# Patient Record
Sex: Female | Born: 1960
Health system: Southern US, Community
[De-identification: ages and names within clinical notes are randomized; demographics above are authoritative.]

## PROBLEM LIST (undated history)

## (undated) DIAGNOSIS — C50919 Malignant neoplasm of unspecified site of unspecified female breast: Secondary | ICD-10-CM

## (undated) DIAGNOSIS — Z9221 Personal history of antineoplastic chemotherapy: Secondary | ICD-10-CM

## (undated) DIAGNOSIS — I10 Essential (primary) hypertension: Secondary | ICD-10-CM

## (undated) DIAGNOSIS — A539 Syphilis, unspecified: Secondary | ICD-10-CM

## (undated) DIAGNOSIS — E079 Disorder of thyroid, unspecified: Secondary | ICD-10-CM

## (undated) DIAGNOSIS — F319 Bipolar disorder, unspecified: Secondary | ICD-10-CM

## (undated) HISTORY — DX: Syphilis, unspecified: A53.9

## (undated) HISTORY — PX: TUBAL LIGATION: SHX77

## (undated) HISTORY — PX: MASTECTOMY: SHX3

---

## 1995-04-20 HISTORY — PX: BREAST BIOPSY: SHX20

## 1998-07-07 ENCOUNTER — Ambulatory Visit (HOSPITAL_COMMUNITY): Admission: RE | Admit: 1998-07-07 | Discharge: 1998-07-07 | Payer: Self-pay | Admitting: Hematology and Oncology

## 1998-07-07 ENCOUNTER — Encounter: Payer: Self-pay | Admitting: Hematology and Oncology

## 1998-09-05 ENCOUNTER — Emergency Department (HOSPITAL_COMMUNITY): Admission: EM | Admit: 1998-09-05 | Discharge: 1998-09-05 | Payer: Self-pay | Admitting: Emergency Medicine

## 1998-09-05 ENCOUNTER — Encounter: Payer: Self-pay | Admitting: Emergency Medicine

## 1999-06-30 ENCOUNTER — Emergency Department (HOSPITAL_COMMUNITY): Admission: EM | Admit: 1999-06-30 | Discharge: 1999-06-30 | Payer: Self-pay | Admitting: Emergency Medicine

## 1999-07-09 ENCOUNTER — Other Ambulatory Visit: Admission: RE | Admit: 1999-07-09 | Discharge: 1999-07-09 | Payer: Self-pay | Admitting: Obstetrics

## 1999-07-27 ENCOUNTER — Encounter: Admission: RE | Admit: 1999-07-27 | Discharge: 1999-07-27 | Payer: Self-pay | Admitting: *Deleted

## 1999-07-27 ENCOUNTER — Encounter: Payer: Self-pay | Admitting: *Deleted

## 1999-08-18 ENCOUNTER — Ambulatory Visit (HOSPITAL_COMMUNITY): Admission: RE | Admit: 1999-08-18 | Discharge: 1999-08-18 | Payer: Self-pay | Admitting: *Deleted

## 1999-08-23 ENCOUNTER — Encounter: Payer: Self-pay | Admitting: *Deleted

## 1999-10-16 ENCOUNTER — Ambulatory Visit (HOSPITAL_COMMUNITY): Admission: RE | Admit: 1999-10-16 | Discharge: 1999-10-16 | Payer: Self-pay | Admitting: *Deleted

## 2000-02-12 ENCOUNTER — Other Ambulatory Visit: Admission: RE | Admit: 2000-02-12 | Discharge: 2000-02-12 | Payer: Self-pay | Admitting: Obstetrics

## 2000-02-12 ENCOUNTER — Encounter (INDEPENDENT_AMBULATORY_CARE_PROVIDER_SITE_OTHER): Payer: Self-pay | Admitting: Specialist

## 2000-08-01 ENCOUNTER — Encounter: Admission: RE | Admit: 2000-08-01 | Discharge: 2000-08-01 | Payer: Self-pay | Admitting: *Deleted

## 2000-08-01 ENCOUNTER — Encounter: Payer: Self-pay | Admitting: *Deleted

## 2000-11-07 ENCOUNTER — Other Ambulatory Visit: Admission: RE | Admit: 2000-11-07 | Discharge: 2000-11-07 | Payer: Self-pay | Admitting: *Deleted

## 2001-05-15 ENCOUNTER — Emergency Department (HOSPITAL_COMMUNITY): Admission: EM | Admit: 2001-05-15 | Discharge: 2001-05-15 | Payer: Self-pay | Admitting: Emergency Medicine

## 2001-07-31 ENCOUNTER — Encounter (HOSPITAL_COMMUNITY): Payer: Self-pay | Admitting: Oncology

## 2001-07-31 ENCOUNTER — Ambulatory Visit (HOSPITAL_COMMUNITY): Admission: RE | Admit: 2001-07-31 | Discharge: 2001-07-31 | Payer: Self-pay | Admitting: Oncology

## 2001-08-01 ENCOUNTER — Encounter: Admission: RE | Admit: 2001-08-01 | Discharge: 2001-08-01 | Payer: Self-pay | Admitting: Internal Medicine

## 2001-08-01 ENCOUNTER — Encounter: Payer: Self-pay | Admitting: Internal Medicine

## 2001-09-16 ENCOUNTER — Emergency Department (HOSPITAL_COMMUNITY): Admission: EM | Admit: 2001-09-16 | Discharge: 2001-09-16 | Payer: Self-pay | Admitting: Emergency Medicine

## 2001-09-16 ENCOUNTER — Encounter: Payer: Self-pay | Admitting: Emergency Medicine

## 2001-12-27 ENCOUNTER — Other Ambulatory Visit: Admission: RE | Admit: 2001-12-27 | Discharge: 2001-12-27 | Payer: Self-pay | Admitting: Obstetrics and Gynecology

## 2003-02-15 ENCOUNTER — Encounter: Admission: RE | Admit: 2003-02-15 | Discharge: 2003-02-15 | Payer: Self-pay | Admitting: Oncology

## 2004-03-16 ENCOUNTER — Other Ambulatory Visit: Admission: RE | Admit: 2004-03-16 | Discharge: 2004-03-16 | Payer: Self-pay | Admitting: Obstetrics and Gynecology

## 2004-03-30 ENCOUNTER — Ambulatory Visit (HOSPITAL_COMMUNITY): Admission: RE | Admit: 2004-03-30 | Discharge: 2004-03-30 | Payer: Self-pay | Admitting: Oncology

## 2004-03-31 ENCOUNTER — Ambulatory Visit: Payer: Self-pay | Admitting: Oncology

## 2004-03-31 ENCOUNTER — Ambulatory Visit (HOSPITAL_COMMUNITY): Admission: RE | Admit: 2004-03-31 | Discharge: 2004-03-31 | Payer: Self-pay | Admitting: Oncology

## 2004-04-23 ENCOUNTER — Emergency Department (HOSPITAL_COMMUNITY): Admission: EM | Admit: 2004-04-23 | Discharge: 2004-04-23 | Payer: Self-pay | Admitting: Emergency Medicine

## 2004-07-17 ENCOUNTER — Emergency Department (HOSPITAL_COMMUNITY): Admission: EM | Admit: 2004-07-17 | Discharge: 2004-07-17 | Payer: Self-pay | Admitting: Family Medicine

## 2004-09-16 ENCOUNTER — Emergency Department (HOSPITAL_COMMUNITY): Admission: EM | Admit: 2004-09-16 | Discharge: 2004-09-16 | Payer: Self-pay | Admitting: Emergency Medicine

## 2004-09-28 ENCOUNTER — Ambulatory Visit: Payer: Self-pay | Admitting: Oncology

## 2005-02-14 ENCOUNTER — Emergency Department (HOSPITAL_COMMUNITY): Admission: EM | Admit: 2005-02-14 | Discharge: 2005-02-14 | Payer: Self-pay | Admitting: Emergency Medicine

## 2005-06-01 ENCOUNTER — Other Ambulatory Visit: Admission: RE | Admit: 2005-06-01 | Discharge: 2005-06-01 | Payer: Self-pay | Admitting: Obstetrics and Gynecology

## 2005-06-04 ENCOUNTER — Encounter: Admission: RE | Admit: 2005-06-04 | Discharge: 2005-06-04 | Payer: Self-pay | Admitting: Oncology

## 2005-06-04 ENCOUNTER — Ambulatory Visit: Payer: Self-pay | Admitting: Oncology

## 2005-06-07 ENCOUNTER — Ambulatory Visit: Payer: Self-pay | Admitting: Oncology

## 2005-06-14 ENCOUNTER — Ambulatory Visit (HOSPITAL_COMMUNITY): Admission: RE | Admit: 2005-06-14 | Discharge: 2005-06-14 | Payer: Self-pay | Admitting: Oncology

## 2005-06-22 ENCOUNTER — Encounter: Admission: RE | Admit: 2005-06-22 | Discharge: 2005-06-22 | Payer: Self-pay | Admitting: Oncology

## 2006-01-12 ENCOUNTER — Encounter: Payer: Self-pay | Admitting: Family

## 2006-06-01 ENCOUNTER — Ambulatory Visit: Payer: Self-pay | Admitting: Oncology

## 2006-08-18 ENCOUNTER — Encounter (INDEPENDENT_AMBULATORY_CARE_PROVIDER_SITE_OTHER): Payer: Self-pay | Admitting: Nurse Practitioner

## 2006-08-18 LAB — CONVERTED CEMR LAB: Pap Smear: NORMAL

## 2006-09-29 ENCOUNTER — Encounter: Admission: RE | Admit: 2006-09-29 | Discharge: 2006-09-29 | Payer: Self-pay | Admitting: Family Medicine

## 2006-12-02 ENCOUNTER — Ambulatory Visit: Payer: Self-pay | Admitting: Family Medicine

## 2006-12-02 DIAGNOSIS — K0510 Chronic gingivitis, plaque induced: Secondary | ICD-10-CM | POA: Insufficient documentation

## 2006-12-02 DIAGNOSIS — K029 Dental caries, unspecified: Secondary | ICD-10-CM | POA: Insufficient documentation

## 2006-12-07 ENCOUNTER — Encounter: Payer: Self-pay | Admitting: Nurse Practitioner

## 2006-12-07 ENCOUNTER — Encounter (INDEPENDENT_AMBULATORY_CARE_PROVIDER_SITE_OTHER): Payer: Self-pay | Admitting: Nurse Practitioner

## 2006-12-07 DIAGNOSIS — F200 Paranoid schizophrenia: Secondary | ICD-10-CM | POA: Insufficient documentation

## 2006-12-07 DIAGNOSIS — Z853 Personal history of malignant neoplasm of breast: Secondary | ICD-10-CM | POA: Insufficient documentation

## 2006-12-07 DIAGNOSIS — I10 Essential (primary) hypertension: Secondary | ICD-10-CM | POA: Insufficient documentation

## 2006-12-07 DIAGNOSIS — A539 Syphilis, unspecified: Secondary | ICD-10-CM | POA: Insufficient documentation

## 2006-12-07 DIAGNOSIS — E0500 Thyrotoxicosis with diffuse goiter without thyrotoxic crisis or storm: Secondary | ICD-10-CM | POA: Insufficient documentation

## 2006-12-09 ENCOUNTER — Ambulatory Visit: Payer: Self-pay | Admitting: *Deleted

## 2007-02-27 ENCOUNTER — Emergency Department (HOSPITAL_COMMUNITY): Admission: EM | Admit: 2007-02-27 | Discharge: 2007-02-27 | Payer: Self-pay | Admitting: Emergency Medicine

## 2007-04-28 ENCOUNTER — Encounter: Admission: RE | Admit: 2007-04-28 | Discharge: 2007-04-28 | Payer: Self-pay | Admitting: Internal Medicine

## 2007-10-13 ENCOUNTER — Other Ambulatory Visit: Admission: RE | Admit: 2007-10-13 | Discharge: 2007-10-13 | Payer: Self-pay | Admitting: Gynecology

## 2007-10-16 ENCOUNTER — Ambulatory Visit (HOSPITAL_COMMUNITY): Admission: RE | Admit: 2007-10-16 | Discharge: 2007-10-16 | Payer: Self-pay | Admitting: Gynecology

## 2007-10-23 ENCOUNTER — Encounter: Admission: RE | Admit: 2007-10-23 | Discharge: 2007-10-23 | Payer: Self-pay | Admitting: Gynecology

## 2007-11-09 ENCOUNTER — Encounter: Payer: Self-pay | Admitting: Family

## 2007-11-23 ENCOUNTER — Encounter: Payer: Self-pay | Admitting: Family

## 2008-02-19 ENCOUNTER — Emergency Department (HOSPITAL_COMMUNITY): Admission: EM | Admit: 2008-02-19 | Discharge: 2008-02-19 | Payer: Self-pay | Admitting: Emergency Medicine

## 2008-03-17 ENCOUNTER — Inpatient Hospital Stay (HOSPITAL_COMMUNITY): Admission: EM | Admit: 2008-03-17 | Discharge: 2008-03-18 | Payer: Self-pay | Admitting: Emergency Medicine

## 2008-05-17 ENCOUNTER — Encounter: Admission: RE | Admit: 2008-05-17 | Discharge: 2008-05-17 | Payer: Self-pay | Admitting: Gynecology

## 2008-07-05 ENCOUNTER — Ambulatory Visit: Payer: Self-pay | Admitting: Gynecology

## 2009-02-27 ENCOUNTER — Encounter: Admission: RE | Admit: 2009-02-27 | Discharge: 2009-02-27 | Payer: Self-pay | Admitting: Infectious Diseases

## 2009-03-28 ENCOUNTER — Emergency Department (HOSPITAL_COMMUNITY): Admission: EM | Admit: 2009-03-28 | Discharge: 2009-03-28 | Payer: Self-pay | Admitting: Family Medicine

## 2009-04-27 ENCOUNTER — Emergency Department (HOSPITAL_COMMUNITY): Admission: EM | Admit: 2009-04-27 | Discharge: 2009-04-27 | Payer: Self-pay | Admitting: Family Medicine

## 2009-05-27 ENCOUNTER — Emergency Department (HOSPITAL_COMMUNITY)
Admission: EM | Admit: 2009-05-27 | Discharge: 2009-05-27 | Payer: Self-pay | Source: Home / Self Care | Admitting: Family Medicine

## 2009-06-24 ENCOUNTER — Ambulatory Visit: Payer: Self-pay | Admitting: Family

## 2009-06-24 ENCOUNTER — Encounter (INDEPENDENT_AMBULATORY_CARE_PROVIDER_SITE_OTHER): Payer: Self-pay | Admitting: *Deleted

## 2009-06-24 DIAGNOSIS — E039 Hypothyroidism, unspecified: Secondary | ICD-10-CM | POA: Insufficient documentation

## 2009-06-24 DIAGNOSIS — I10 Essential (primary) hypertension: Secondary | ICD-10-CM | POA: Insufficient documentation

## 2009-06-24 DIAGNOSIS — R9431 Abnormal electrocardiogram [ECG] [EKG]: Secondary | ICD-10-CM | POA: Insufficient documentation

## 2009-06-24 DIAGNOSIS — F17200 Nicotine dependence, unspecified, uncomplicated: Secondary | ICD-10-CM | POA: Insufficient documentation

## 2009-06-24 DIAGNOSIS — F200 Paranoid schizophrenia: Secondary | ICD-10-CM | POA: Insufficient documentation

## 2009-07-07 ENCOUNTER — Telehealth (INDEPENDENT_AMBULATORY_CARE_PROVIDER_SITE_OTHER): Payer: Self-pay | Admitting: *Deleted

## 2009-07-07 ENCOUNTER — Ambulatory Visit: Payer: Self-pay | Admitting: Gastroenterology

## 2009-07-08 ENCOUNTER — Ambulatory Visit: Payer: Self-pay | Admitting: Family

## 2009-07-08 LAB — CONVERTED CEMR LAB
AST: 19 units/L (ref 0–37)
Albumin: 3.6 g/dL (ref 3.5–5.2)
Basophils Relative: 0.1 % (ref 0.0–3.0)
Eosinophils Absolute: 0 10*3/uL (ref 0.0–0.7)
Eosinophils Relative: 0.9 % (ref 0.0–5.0)
GFR calc non Af Amer: 85.38 mL/min (ref >60)
LDL Cholesterol: 80 mg/dL (ref 0–99)
Lymphocytes Relative: 71.2 % — ABNORMAL HIGH (ref 12.0–46.0)
MCHC: 32.4 g/dL (ref 30.0–36.0)
Monocytes Absolute: 0.4 10*3/uL (ref 0.1–1.0)
Potassium: 3.6 meq/L (ref 3.5–5.1)
RBC: 4.26 M/uL (ref 3.87–5.11)
RDW: 13.4 % (ref 11.5–14.6)
Sodium: 141 meq/L (ref 135–145)
Total Bilirubin: 0.4 mg/dL (ref 0.3–1.2)
Total Protein: 7 g/dL (ref 6.0–8.3)
WBC: 3.8 10*3/uL — ABNORMAL LOW (ref 4.5–10.5)

## 2009-07-09 ENCOUNTER — Encounter: Payer: Self-pay | Admitting: Family

## 2009-07-11 ENCOUNTER — Encounter: Payer: Self-pay | Admitting: Family

## 2009-07-11 DIAGNOSIS — C50919 Malignant neoplasm of unspecified site of unspecified female breast: Secondary | ICD-10-CM | POA: Insufficient documentation

## 2009-07-11 DIAGNOSIS — E039 Hypothyroidism, unspecified: Secondary | ICD-10-CM | POA: Insufficient documentation

## 2009-07-14 ENCOUNTER — Telehealth: Payer: Self-pay | Admitting: Family

## 2009-08-26 ENCOUNTER — Encounter (INDEPENDENT_AMBULATORY_CARE_PROVIDER_SITE_OTHER): Payer: Self-pay

## 2009-08-26 ENCOUNTER — Ambulatory Visit: Payer: Self-pay | Admitting: Gastroenterology

## 2009-09-08 ENCOUNTER — Telehealth: Payer: Self-pay | Admitting: Family

## 2009-09-19 ENCOUNTER — Ambulatory Visit: Payer: Self-pay | Admitting: Family

## 2009-10-16 ENCOUNTER — Ambulatory Visit: Payer: Self-pay | Admitting: Nurse Practitioner

## 2009-10-16 DIAGNOSIS — E669 Obesity, unspecified: Secondary | ICD-10-CM | POA: Insufficient documentation

## 2010-03-16 ENCOUNTER — Telehealth (INDEPENDENT_AMBULATORY_CARE_PROVIDER_SITE_OTHER): Payer: Self-pay | Admitting: Nurse Practitioner

## 2010-05-06 ENCOUNTER — Encounter (INDEPENDENT_AMBULATORY_CARE_PROVIDER_SITE_OTHER): Payer: Self-pay | Admitting: Nurse Practitioner

## 2010-05-06 ENCOUNTER — Other Ambulatory Visit: Payer: Self-pay | Admitting: Nurse Practitioner

## 2010-05-06 ENCOUNTER — Ambulatory Visit
Admission: RE | Admit: 2010-05-06 | Discharge: 2010-05-06 | Payer: Self-pay | Source: Home / Self Care | Attending: Nurse Practitioner | Admitting: Nurse Practitioner

## 2010-05-06 LAB — CONVERTED CEMR LAB
ALT: 18 units/L (ref 0–35)
AST: 16 units/L (ref 0–37)
Albumin: 4.4 g/dL (ref 3.5–5.2)
Basophils Absolute: 0 10*3/uL (ref 0.0–0.1)
Calcium: 10 mg/dL (ref 8.4–10.5)
Chloride: 103 meq/L (ref 96–112)
Glucose, Bld: 75 mg/dL (ref 70–99)
Glucose, Urine, Semiquant: NEGATIVE
HCT: 38.9 % (ref 36.0–46.0)
Hemoglobin: 12.8 g/dL (ref 12.0–15.0)
KOH Prep: NEGATIVE
Lymphs Abs: 4.2 10*3/uL — ABNORMAL HIGH (ref 0.7–4.0)
MCHC: 32.9 g/dL (ref 30.0–36.0)
MCV: 87.6 fL (ref 78.0–100.0)
Microalb, Ur: 3.59 mg/dL — ABNORMAL HIGH (ref 0.00–1.89)
Monocytes Relative: 5 % (ref 3–12)
Neutro Abs: 2.7 10*3/uL (ref 1.7–7.7)
Neutrophils Relative %: 36 % — ABNORMAL LOW (ref 43–77)
OCCULT 1: NEGATIVE
Protein, U semiquant: 30
RDW: 14.2 % (ref 11.5–15.5)
Rapid HIV Screen: NEGATIVE
Specific Gravity, Urine: >=1.03
Urobilinogen, UA: 0.2
pH: 6

## 2010-05-07 ENCOUNTER — Ambulatory Visit (HOSPITAL_COMMUNITY)
Admission: RE | Admit: 2010-05-07 | Discharge: 2010-05-07 | Payer: Self-pay | Source: Home / Self Care | Attending: Internal Medicine | Admitting: Internal Medicine

## 2010-05-07 ENCOUNTER — Encounter (INDEPENDENT_AMBULATORY_CARE_PROVIDER_SITE_OTHER): Payer: Self-pay | Admitting: Nurse Practitioner

## 2010-05-07 ENCOUNTER — Ambulatory Visit (HOSPITAL_COMMUNITY)
Admission: RE | Admit: 2010-05-07 | Discharge: 2010-05-07 | Payer: Self-pay | Source: Home / Self Care | Admitting: Internal Medicine

## 2010-05-10 ENCOUNTER — Encounter (HOSPITAL_COMMUNITY): Payer: Self-pay | Admitting: Oncology

## 2010-05-10 ENCOUNTER — Encounter: Payer: Self-pay | Admitting: Gynecology

## 2010-05-19 NOTE — Miscellaneous (Signed)
  Clinical Lists Changes  Problems: Added new problem of MALIGNANT NEOPLASM OF BREAST UNSPECIFIED SITE (ICD-174.9) Added new problem of GRAVES' DISEASE, HX OF (ICD-V12.2)

## 2010-05-19 NOTE — Miscellaneous (Signed)
  Clinical Lists Changes 

## 2010-05-19 NOTE — Letter (Signed)
Summary: Previsit letter  [facility]  [address] [place], [place] [address]   Phone: [phone]  Fax: [phone]       06/24/2009 MRN: [id number]  Jacqueline Berry [address] [place], [place]  [address]  Dear [name],  Welcome to the Gastroenterology Division at [facility].    You are scheduled to see a nurse for your pre-procedure visit on 07-21-09 at 11:30am on the 3rd floor at [facility], [address]  We ask that you try to arrive at our office 15 minutes prior to your appointment time to allow for check-in.  Your nurse visit will consist of discussing your medical and surgical history, your immediate family medical history, and your medications.    Please bring a complete list of all your medications or, if you prefer, bring the medication bottles and we will list them.  We will need to be aware of both prescribed and over the counter drugs.  We will need to know exact dosage information as well.  If you are on blood thinners (Coumadin, Plavix, Aggrenox, Ticlid, etc.) please call our office today/prior to your appointment, as we need to consult with your physician about holding your medication.   Please be prepared to read and sign documents such as consent forms, a financial agreement, and acknowledgement forms.  If necessary, and with your consent, a friend or relative is welcome to sit-in on the nurse visit with you.  Please bring your insurance card so that we may make a copy of it.  If your insurance requires a referral to see a specialist, please bring your referral form from your primary care physician.  No co-pay is required for this nurse visit.     If you cannot keep your appointment, please call [phone] to cancel or reschedule prior to your appointment date.  This allows [place] the opportunity to schedule an appointment for another patient in need of care.    Thank you for choosing [facility] for your medical needs.   We appreciate the opportunity to care for you.  Please visit [place] at our website  to learn more about our practice.                     Sincerely.                                                                                                                   The Gastroenterology Division

## 2010-05-19 NOTE — Letter (Signed)
Summary: [facility] Instructions   Gastroenterology  [address] [place], [place] [address]   Phone: [phone]  Fax: [phone]       Jacqueline Berry    [birth year]    MRN: [id number]        Procedure Day Dorna Bloom:  Jake Shark  09/09/09     Arrival Time:  8:30am     Procedure Time:  9:30am    Location of Procedure:                    [facility] (4th Floor)                        PREPARATION FOR COLONOSCOPY WITH MOVIPREP   Starting 5 days prior to your procedure  THURSDAY 05/19  do not eat nuts, seeds, popcorn, corn, beans, peas,  salads, or any raw vegetables.  Do not take any fiber supplements (e.g. Metamucil, Citrucel, and Benefiber).  THE DAY BEFORE YOUR PROCEDURE         DATE:  05/23 DAY: MONDAY  1.  Drink clear liquids the entire day-NO SOLID FOOD  2.  Do not drink anything colored red or purple.  Avoid juices with pulp.  No orange juice.  3.  Drink at least 64 oz. (8 glasses) of fluid/clear liquids during the day to prevent dehydration and help the prep work efficiently.  CLEAR LIQUIDS INCLUDE: Water Jello Ice Popsicles Tea (sugar ok, no milk/cream) Powdered fruit flavored drinks Coffee (sugar ok, no milk/cream) Gatorade Juice: apple, white grape, white cranberry  Lemonade Clear bullion, consomm, broth Carbonated beverages (any kind) Strained chicken noodle soup Hard Candy                             4.  In the morning, mix first dose of MoviPrep solution:    Empty 1 Pouch A and 1 Pouch B into the disposable container    Add lukewarm drinking water to the top line of the container. Mix to dissolve    Refrigerate (mixed solution should be used within 24 hrs)  5.  Begin drinking the prep at 5:00 p.m. The MoviPrep container is divided by 4 marks.   Every 15 minutes drink the solution down to the next mark (approximately 8 oz) until the full liter is complete.   6.  Follow completed prep with 16 oz of clear liquid of your choice  (Nothing red or purple).  Continue to drink clear liquids until bedtime.  7.  Before going to bed, mix second dose of MoviPrep solution:    Empty 1 Pouch A and 1 Pouch B into the disposable container    Add lukewarm drinking water to the top line of the container. Mix to dissolve    Refrigerate  THE DAY OF YOUR PROCEDURE      DATE: 05/24  DAY: TUESDAY  Beginning at  4:30 a.m. (5 hours before procedure):         1. Every 15 minutes, drink the solution down to the next mark (approx 8 oz) until the full liter is complete.  2. Follow completed prep with 16 oz. of clear liquid of your choice.    3. You may drink clear liquids until 7:30am  (2 HOURS BEFORE PROCEDURE).   MEDICATION INSTRUCTIONS  Unless otherwise instructed, you should take regular prescription medications with a small sip of water   as early  as possible the morning of your procedure..  Additional medication instructions: Do not take Lisinopril am of procedure.         OTHER INSTRUCTIONS  You will need a responsible adult at least 51 years of age to accompany you and drive you home.   This person must remain in the waiting room during your procedure.  Wear loose fitting clothing that is easily removed.  Leave jewelry and other valuables at home.  However, you may wish to bring a book to read or  an iPod/MP3 player to listen to music as you wait for your procedure to start.  Remove all body piercing jewelry and leave at home.  Total time from sign-in until discharge is approximately 2-3 hours.  You should go home directly after your procedure and rest.  You can resume normal activities the  day after your procedure.  The day of your procedure you should not:   Drive   Make legal decisions   Operate machinery   Drink alcohol   Return to work  You will receive specific instructions about eating, activities and medications before you leave.    The above instructions have been reviewed and  explained to me by   Ulis Rias RN  Aug 26, 2009 2:51 PM     I fully understand and can verbalize these instructions _____________________________ Date _________

## 2010-05-19 NOTE — Progress Notes (Signed)
Summary: 90 day supply levothroid, lisinopril-hctz?  Phone Note Outgoing Call   Summary of Call: Pt is due to come back to be seen in early June.  She can come sooner if she is concerned that her insurance is going to run out and I will be happy to refill for 90 days.   Initial call taken by: Lemont Fillers FNP,  Sep 08, 2009 1:26 PM  Follow-up for Phone Call        Left message on machine to return my call.  Mervin Kung CMA  Sep 08, 2009 1:58 PM   Additional Follow-up for Phone Call Additional follow up Details #1::        Advised pt per [facility] instructions.  Pt states she is already out of ins. and new ins. will not be effective until 90 days.  She has cancelled her f/u in June and says she doesn't know if she will have the money to be seen in one month.  Mervin Kung CMA  Sep 09, 2009 8:19 AM     Additional Follow-up for Phone Call Additional follow up Details #2::    Spoke to patient.  She is agreeable to lab draw and nurse visit for BP check.  Please arrange and set up BMET (401.1), TSH (244.9).  We will plan for a full visit in 3 months when her insurance is available. Thanks Follow-up by: Lemont Fillers FNP,  Sep 09, 2009 8:42 AM  Additional Follow-up for Phone Call Additional follow up Details #3:: Details for Additional Follow-up Action Taken: Advised pt. per [facility] instructions.  Nurse visit for bp check scheduled for 10/02/09 @ 1:30pm then pt. will go to lab for blood work.  Order printed and faxed to lab.  Mervin Kung CMA  Sep 09, 2009 9:59 AM

## 2010-05-19 NOTE — Assessment & Plan Note (Signed)
Summary: new to [facility]//[facility]   Vital Signs:  Patient profile:   50 year old female Height:      63.25 inches Weight:      211.38 pounds BMI:     37.28 Temp:     97.0 degrees F oral Pulse rate:   80 / minute Pulse rhythm:   regular Resp:     16 per minute BP sitting:   110 / 80  (right arm) Cuff size:   large  Vitals Entered By: Mervin Kung CMA (June 24, 2009 9:23 AM) Comments Pt would like Zyban to help stop smoking Pt needs refills on LIsinopril-HCTZ and Levothyroxin.   History of Present Illness: Jacqueline Berry is a 50 year old female who presents today to establish care.  Previously she was seeing Dr. Leslee Home.  She is switching providers due to insurance.  Tobacco abuse- smokes 1/2 PPD- wants to quit, has not tried any quitting aids in the past.  Has quit off/on due to pregnancy and during chemotherapy.    Breast CA- 1997, had L mastectomy, chemo. She [facility] by Dr. Arline Asp, but has not been back in years- due to being uninsured.  Depression- takes Haldol, seen at [facility].  Feels like her depression is well controlled on monthy haldol injections.  Denies suicide ideation.  HTN- Is not compliant with low sodium diet.    Preventative- last Mammo 03/2009, last pap- 2009-she sees Dr. Lily Peer of GYN and plans to call to schedule PAP.  Notes + hx of fibroids- she is considering hysterectomy- but put off due to lack of insurance. Thinks last tetanus was 2008 with Dr. Lorenz Coaster, declines flu shot.  Does not exercise.  Notes 15 lb weight gain since 8/10- since starting a sedentary job.    Preventive Screening-Counseling & Management  Alcohol-Tobacco     Smoking Status: current     Smoking Cessation Counseling: yes     Packs/Day: 0.5     Year Started: 1980  Caffeine-Diet-Exercise     Does Patient Exercise: no  Allergies (verified): No Known Drug Allergies  Past History:  Past Medical History: Breast Cancer left  breast--1997(mastectomy) Depression HTN  Past Surgical History: Mastectomy left breast--1997 Bilateral Tubal Ligation 1993  Family History: Colon cancer--mother, deceased at age 77 (pt not sure if breast or colon cancer)  Breast Cancer--mother,sisiter Lung Cancer--maternal aunt Father--stroke deceased at age 18 HTN--father Diabetes--brother Daugher- anxiety  Social History: Married Current Smoker Alcohol use-no Regular exercise-no 3 children Smoking Status:  current Packs/Day:  0.5 Does Patient Exercise:  no  Review of Systems       Constitutional: Denies Fever ENT:  Denies nasal congestion or sore throat. Resp: Denies cough CV:  Denies Chest Pain GI:  Denies nausea or vomitting GU: Denies dysuria Lymphatic: Denies lymphadenopathy Musculoskeletal:  Denies muscle/joint pain Skin:  Denies Rashes Psychiatric: Denies suicide ideation,  depression is well controlled, denies anxiety Neuro: Denies numbness     Physical Exam  General:  Well-developed,well-nourished,in no acute distress; alert,appropriate and cooperative throughout examination Head:  Normocephalic and atraumatic without obvious abnormalities. No apparent alopecia or balding. Eyes:  PERRLA Ears:  External ear exam shows no significant lesions or deformities.  Otoscopic examination reveals clear canals, tympanic membranes are intact bilaterally without bulging, retraction, inflammation or discharge. Hearing is grossly normal bilaterally. Mouth:  pharynx pink and moist and poor dentition.   Neck:  No deformities, masses, or tenderness noted. Breasts:  R breast  normal, no masses, left breast- surgically absent Lungs:  Normal respiratory effort, chest expands symmetrically. Lungs are clear to auscultation, no crackles or wheezes. Heart:  Normal rate and regular rhythm. S1 and S2 normal without gallop, murmur, click, rub or other extra sounds. Abdomen:  Bowel sounds positive,abdomen soft and non-tender without  masses, organomegaly or hernias noted. Genitalia:  deferred to GYN Msk:  No deformity or scoliosis noted of thoracic or lumbar spine.   Extremities:  No clubbing, cyanosis, edema, or deformity noted with normal full range of motion of all joints.   Neurologic:  alert & oriented X3, strength normal in all extremities, and gait normal.  2+bilateral patellar/plantar reflex Skin:  Intact without suspicious lesions or rashes Cervical Nodes:  No lymphadenopathy noted Axillary Nodes:  No palpable lymphadenopathy Psych:  Cognition and judgment appear intact. Alert and cooperative with normal attention span and concentration. No apparent delusions, illusions, hallucinations   Impression & Recommendations:  Problem # 1:  Preventive Health Care (ICD-V70.0) Assessment New Patient counselled RE:  weight loss, diet, exercise.  Will try to obtain old records RE: tetanus.  Up to date on Mammo.  Will refer for screening colonoscopy, patient tells me mom died in her 57's and she believes she may have had colon CA.  Pt to return for fasting laboratories.   Problem # 2:  PARANOID SCHIZOPHRENIA IN REMISSION (ICD-295.35) Assessment: Improved Follows with mental health. Tells me this is well controlled.  Problem # 3:  TOBACCO ABUSE (ICD-305.1)  Pt instructed to try patch.  I am hesitant to try Wellbutrin or chantix given her psychiatric history.  Orders: Tobacco use cessation intermediate 3-10 minutes ([address])  Problem # 4:  HYPERTENSION (ICD-401.9) Assessment: Improved Patient counselled on low sodium diet.  Plan to check BMET with fasting labs Her updated medication list for this problem includes:    Lisinopril-hydrochlorothiazide 20-12.5 Mg Tabs (Lisinopril-hydrochlorothiazide) .Marland Kitchen... Take 1 tablet by mouth once a day  BP today: 110/80  Problem # 5:  ABNORMAL ELECTROCARDIOGRAM (ICD-794.31) Assessment: Comment Only EG notes TWI in V1, ? old inferior infarct, ?old anteroseptal infact.  Pt had admission  2009 for atypical CP and tells me that she had EKG which showed old MI- this was followed by a reportedly normal stress test.  Records indicate that this stress test was likely done under Dr. Viann Fish.  Will request old EKG and stress test records as well as records from primary MD.  Pt is asymptomatic.  Complete Medication List: 1)  Lisinopril-hydrochlorothiazide 20-12.5 Mg Tabs (Lisinopril-hydrochlorothiazide) .... Take 1 tablet by mouth once a day 2)  Levothyroxine Sodium 100 Mcg Tabs (Levothyroxine sodium) .... Take 1 tablet by mouth once a day  Other Orders: Gastroenterology Referral (GI)  Patient Instructions: 1)  Please return fasting for the following labs: 2)  BMET, CBC, LFT's, FLP,  TSH (V70) 3)  Tobacco is very bad for your health and your loved ones! You Should stop smoking!. 4)  Stop Smoking Tips: Choose a Quit date. Cut down before the Quit date. decide what you will do as a substitute when you feel the urge to smoke(gum,toothpick,exercise). 5)  It is important that you exercise regularly at least 20 minutes 5 times a week. If you develop chest pain, have severe difficulty breathing, or feel very tired , stop exercising immediately and seek medical attention. 6)  Please follow up in 3 months.   7)  You need to lose weight. Consider a lower calorie diet and regular exercise.  Prescriptions:  LEVOTHYROXINE SODIUM 100 MCG TABS (LEVOTHYROXINE SODIUM) Take 1 tablet by mouth once a day  #30 x 2   Entered and Authorized by:   Lemont Fillers FNP   Signed by:   Lemont Fillers FNP on 06/24/2009   Method used:   Electronically to        [facility] [phone]* (retail)       [address]       [place], [place]  [address]       Ph: [phone]       Fax: [phone]   RxID:   [id number] LISINOPRIL-HYDROCHLOROTHIAZIDE 20-12.5 MG TABS (LISINOPRIL-HYDROCHLOROTHIAZIDE) Take 1 tablet by mouth once a day  #30 x 2   Entered and Authorized by:   Lemont Fillers  FNP   Signed by:   Lemont Fillers FNP on 06/24/2009   Method used:   Electronically to        [facility] [phone]* (retail)       [address]       [place], [place]  [address]       Ph: [phone]       Fax: [phone]   RxID:   [id number]    Current Allergies (reviewed today): No known allergies     Preventive Care Screening  Mammogram:    Date:  04/15/2009    Results:  normal   Pap Smear:    Date:  12/19/2007    Results:  normal        Vital Signs:  Patient Profile:   50 year old female Height:     63.25 inches Weight:      211.38 pounds BMI:     37.28 Temp:     97.0 degrees F oral Pulse rate:   80 / minute Pulse rhythm:   regular Resp:     16 per minute BP sitting:   110 / 80 Cuff size:   large

## 2010-05-19 NOTE — Letter (Signed)
Summary: Viann Fish MD  Viann Fish MD   Imported By: Lanelle Bal 07/01/2009 12:30:46  _____________________________________________________________________  External Attachment:    Type:   Image     Comment:   External Document

## 2010-05-19 NOTE — Progress Notes (Signed)
Summary: Query:  Refill lisinopril-HCTZ and levothyroxine?  Phone Note Refill Request   Refills Requested: Medication #1:  LISINOPRIL-HYDROCHLOROTHIAZIDE 20-12.5 MG TABS Take 1 tablet by mouth once a day  Medication #2:  LEVOTHROID 112 MCG TABS one tablet by mouth daily. [facility] [address]Last PT :    [phone] LIVE MESSAGE PLEASE .  Initial call taken by: Domenic Polite,  March 16, 2010 9:12 AM  Follow-up for Phone Call        Sent to N. Daphine Deutscher.  Refill meds?  Last seen 10/16/09.  Dutch Quint RN  March 16, 2010 5:01 PM   Additional Follow-up for Phone Call Additional follow up Details #1::        per last visit pt was supposed to f/u for a CPE contact pt and see if you can assist in scheduling for pt. Ok to renew BP meds with 2 refills Additional Follow-up by: Lehman Prom FNP,  March 16, 2010 6:10 PM    Additional Follow-up for Phone Call Additional follow up Details #2::    Refill completed -- phone line is busy.  Dutch Quint RN  March 17, 2010 5:48 PM   Left message on answering machine for pt to call back.Marland KitchenMarland KitchenMarland KitchenArmenia Shannon  March 18, 2010 9:31 AM    pt has cpe scheduled.. January 18.. did you want to refill the thyroid med...Marland KitchenMarland KitchenArmenia Shannon  March 18, 2010 10:01 AM   Additional Follow-up for Phone Call Additional follow up Details #3:: Details for Additional Follow-up Action Taken: yes, i also sent the thyroid medication. notify pt. both should be at pharmacy n.[facility] March 18, 2010  1:39 PM   pt aware Additional Follow-up by: [place] Shannon,  March 19, 2010 11:24 AM  Prescriptions: LEVOTHROID 112 MCG TABS (LEVOTHYROXINE SODIUM) one tablet by mouth daily  #30 x 2   Entered and Authorized by:   Lehman Prom FNP   Signed by:   Lehman Prom FNP on 03/18/2010   Method used:   Electronically to        [facility] [phone]* (retail)       [address]       [place], [place]  [address]       Ph: [phone]        Fax: [phone]   RxID:   [id number] LISINOPRIL-HYDROCHLOROTHIAZIDE 20-12.5 MG TABS (LISINOPRIL-HYDROCHLOROTHIAZIDE) Take 1 tablet by mouth once a day  #30 x 2   Entered by:   Dutch Quint RN   Authorized by:   Lehman Prom FNP   Signed by:   Dutch Quint RN on 03/17/2010   Method used:   Electronically to        [facility] [phone]* (retail)       [address]       [place], [place]  [address]       Ph: [phone]       Fax: [phone]   RxID:   [id number]

## 2010-05-19 NOTE — Assessment & Plan Note (Signed)
Summary: HTN   Vital Signs:  Patient profile:   50 year old female LMP:     08/17/2009 Height:      62.25 inches Weight:      211.2 pounds BMI:     38.46 BSA:     1.96 Temp:     97.9 degrees F oral Pulse rate:   78 / minute Pulse rhythm:   regular Resp:     20 per minute BP sitting:   133 / 92  (left arm) Cuff size:   large  Vitals Entered By: Levon Hedger (October 16, 2009 12:02 PM)  Nutrition Counseling: Patient's BMI is greater than 25 and therefore counseled on weight management options. CC: re-establish....needs medication refills, Hypertension Management Is Patient Diabetic? No Pain Assessment Patient in pain? no       Does patient need assistance? Functional Status Self care Ambulation Normal LMP (date): 08/17/2009     Enter LMP: 08/17/2009 Last PAP Result normal   CC:  re-establish....needs medication refills and Hypertension Management.  History of Present Illness:  Pt into the office for htn and thyroid Pt last seen in here in 2008. She has since been going to [place] but has changed insurance and has not been back there.  Social - married, grown children Husband is employed at this time  No acute problems today  Hypertension History:      She denies headache, chest pain, and palpitations.  She notes no problems with any antihypertensive medication side effects.  pt is taking meds as ordered.        Positive major cardiovascular risk factors include hypertension and current tobacco user.  Negative major cardiovascular risk factors include female age less than 59 years old and no history of diabetes or hyperlipidemia.        Further assessment for target organ damage reveals no history of ASHD, cardiac end-organ damage (CHF/LVH), stroke/TIA, peripheral vascular disease, renal insufficiency, or hypertensive retinopathy.     Habits & Providers  Alcohol-Tobacco-Diet     Alcohol drinks/day: 0     Tobacco Status: current     Tobacco Counseling: to  quit use of tobacco products     Cigarette Packs/Day: 0.5     Year Started: 1980  Exercise-Depression-Behavior     Does Patient Exercise: yes     Exercise Counseling: to improve exercise regimen     Type of exercise: walking     Exercise (avg: min/session): <30     Have you felt down or hopeless? yes     Have you felt little pleasure in things? yes     Depression Counseling: further diagnostic testing and/or other treatment is indicated     Drug Use: no  Comments: Pt is going to the [facility] and gets injections monthly  Allergies (verified): 1)  ! * Latex  Social History: Does Patient Exercise:  yes  Review of Systems CV:  Denies chest pain or discomfort. Resp:  Denies cough. GI:  Denies abdominal pain, nausea, and vomiting. MS:  Complains of joint pain; intermittent pain in left hip and leg. Psych:  Complains of depression.  Physical Exam  General:  alert.   Head:  normocephalic.   Lungs:  normal breath sounds.   Heart:  normal rate and regular rhythm.   Abdomen:  soft, non-tender, and normal bowel sounds.   Msk:  up to the exam table Neurologic:  alert & oriented X3.   Skin:  color normal.   Psych:  Oriented X3.  Impression & Recommendations:  Problem # 1:  HYPERTENSION (ICD-401.9) Bp is stable reviewed DASH diet meds sent to the pharmacy Her updated medication list for this problem includes:    Lisinopril-hydrochlorothiazide 20-12.5 Mg Tabs (Lisinopril-hydrochlorothiazide) .Marland Kitchen... Take 1 tablet by mouth once a day  Problem # 2:  HYPOTHYROIDISM (ICD-244.9) labs done last in 06/2009 will refill meds Her updated medication list for this problem includes:    Levothroid 112 Mcg Tabs (Levothyroxine sodium) ..... One tablet by mouth daily  Problem # 3:  TOBACCO ABUSE (ICD-305.1) advised pt that she need to quit smoking  Problem # 4:  OBESITY (ICD-278.00) advised pt to restart exercise and monitor diet  Complete Medication List: 1)  Haldol Decanoate  50 Mg/ml Soln (Haloperidol decanoate) .... Given by [facility] 2)  Lisinopril-hydrochlorothiazide 20-12.5 Mg Tabs (Lisinopril-hydrochlorothiazide) .... Take 1 tablet by mouth once a day 3)  Levothroid 112 Mcg Tabs (Levothyroxine sodium) .... One tablet by mouth daily  Hypertension Assessment/Plan:      The patient's hypertensive risk group is category B: At least one risk factor (excluding diabetes) with no target organ damage.  Her calculated 10 year risk of coronary heart disease is 13 %.  Today's blood pressure is 133/92.  Her blood pressure goal is < 140/90.  Patient Instructions: 1)  Schedule an appointment in 4-6 weeks for complete physical exam.  You will need PAP, EKG and  PHQ-9, u/a, microalbumin Prescriptions: LEVOTHROID 112 MCG TABS (LEVOTHYROXINE SODIUM) one tablet by mouth daily  #30 x 5   Entered and Authorized by:   Lehman Prom FNP   Signed by:   Lehman Prom FNP on 10/16/2009   Method used:   Electronically to        [facility] [phone]* (retail)       [address]       [place], [place]  [address]       Ph: [phone]       Fax: [phone]   RxID:   [id number] LISINOPRIL-HYDROCHLOROTHIAZIDE 20-12.5 MG TABS (LISINOPRIL-HYDROCHLOROTHIAZIDE) Take 1 tablet by mouth once a day  #30 x 5   Entered and Authorized by:   Lehman Prom FNP   Signed by:   Lehman Prom FNP on 10/16/2009   Method used:   Electronically to        [facility] [phone]* (retail)       [address]       [place], [place]  [address]       Ph: [phone]       Fax: [phone]   RxID:   [id number]

## 2010-05-19 NOTE — Progress Notes (Signed)
Summary: Procedure and Previsit change  Pt. here for previsit.  She needed to change her date for her colon.  Her new date is 09/09/09.  I gave her the option of continuing with her previsit or rescheduling it until closer to her procedure day.  She opted to have her Previsit 08/26/09.

## 2010-05-19 NOTE — Miscellaneous (Signed)
Summary: Lec previsit  Clinical Lists Changes  Medications: Added new medication of MOVIPREP 100 GM  SOLR (PEG-KCL-NACL-NASULF-NA ASC-C) As per prep instructions. - Signed Rx of MOVIPREP 100 GM  SOLR (PEG-KCL-NACL-NASULF-NA ASC-C) As per prep instructions.;  #1 x 0;  Signed;  Entered by: Ulis Rias RN;  Authorized by: Rachael Fee MD;  Method used: Electronically to [facility] [phone]*, [address] [place], [place]  [address], Ph: [phone], Fax: [phone] Allergies: Added new allergy or adverse reaction of * LATEX Observations: Added new observation of NKA: F (08/26/2009 14:36)    Prescriptions: MOVIPREP 100 GM  SOLR (PEG-KCL-NACL-NASULF-NA ASC-C) As per prep instructions.  #1 x 0   Entered by:   Ulis Rias RN   Authorized by:   Rachael Fee MD   Signed by:   Ulis Rias RN on 08/26/2009   Method used:   Electronically to        [facility] [phone]* (retail)       [address]       [place], [place]  [address]       Ph: [phone]       Fax: [phone]   RxID:   [id number]

## 2010-05-19 NOTE — Progress Notes (Signed)
  Phone Note Outgoing Call   Summary of Call: Pls call Ms Esty and let her know that I would like to increase her thyroid medication slightly.    She should try to exercise to improve her "good cholesterol"-  her CBC looks like she may have had a cold that day- we can repeat next visit.   Pls arrange 6 week follow up apt.   Initial call taken by: Lemont Fillers FNP,  July 14, 2009 9:12 AM  Follow-up for Phone Call        Pt. notified of results. Follow up scheduled for 08/25/09. Nicki Guadalajara Fergerson CMA  July 14, 2009 9:38 AM     New/Updated Medications: LEVOTHROID 112 MCG TABS (LEVOTHYROXINE SODIUM) one tablet by mouth daily Prescriptions: LEVOTHROID 112 MCG TABS (LEVOTHYROXINE SODIUM) one tablet by mouth daily  #30 x 1   Entered and Authorized by:   Lemont Fillers FNP   Signed by:   Lemont Fillers FNP on 07/14/2009   Method used:   Electronically to        [facility] [phone]* (retail)       [address]       [place], [place]  [address]       Ph: [phone]       Fax: [phone]   RxID:   [id number]

## 2010-05-19 NOTE — Letter (Signed)
Summary: Records Dated 01-11-06 thru 03-17-08/[facility]  Records Dated 01-11-06 thru 03-17-08/[facility]   Imported By: Lanelle Bal 08/15/2009 10:16:01  _____________________________________________________________________  External Attachment:    Type:   Image     Comment:   External Document

## 2010-05-21 ENCOUNTER — Encounter (INDEPENDENT_AMBULATORY_CARE_PROVIDER_SITE_OTHER): Payer: Self-pay | Admitting: Nurse Practitioner

## 2010-05-21 ENCOUNTER — Ambulatory Visit: Admit: 2010-05-21 | Payer: Self-pay | Admitting: Nurse Practitioner

## 2010-05-21 LAB — CONVERTED CEMR LAB
LDL Cholesterol: 90 mg/dL (ref 0–99)
Triglycerides: 133 mg/dL (ref <150)
VLDL: 27 mg/dL (ref 0–40)

## 2010-05-21 NOTE — Progress Notes (Signed)
Summary: Office Visit/DEPRESSION SCREENING  Office Visit/DEPRESSION SCREENING   Imported By: Arta Bruce 05/08/2010 15:22:29  _____________________________________________________________________  External Attachment:    Type:   Image     Comment:   External Document

## 2010-05-21 NOTE — Assessment & Plan Note (Signed)
Summary: Complete Physical Exam   Vital Signs:  Patient profile:   50 year old female Height:      62.25 inches Weight:      216 pounds BMI:     39.33 Temp:     97.2 degrees F oral Pulse rate:   76 / minute Pulse rhythm:   regular Resp:     18 per minute BP sitting:   140 / 86  (left arm) Cuff size:   large  Vitals Entered By: Armenia Shannon (May 06, 2010 11:15 AM)  Nutrition Counseling: Patient's BMI is greater than 25 and therefore counseled on weight management options. CC: cpp, Hypertension Management Is Patient Diabetic? No Pain Assessment Patient in pain? no       Does patient need assistance? Functional Status Self care Ambulation Normal  Vision Screening:Left eye w/o correction: 20 / 20 Right Eye w/o correction: 20 / 25-1 Both eyes w/o correction:  20/ 15-2        Vision Entered By: Armenia Shannon (May 06, 2010 11:33 AM)   CC:  cpp and Hypertension Management.  History of Present Illness:  Pt into the office for a complete physical exam  Mammogram - sister with hx of breast cancer last mammogram was 1 year ago and has one scheduled for tomorrow (pt scheduled herself)  PAP - last PAP was over 1 year ago no family hx of cervical or ovarian cancer Menses - notes every 3-4 months.  Optho - wears reading glasses.  No recent eye exam  Dental - no recent detnal exam  Colon cancer - last done in 2001 and pt was to get repeated in 5 years due to mother's history of colon cancer but she has not had the money to get the testing done  Social - married, youngest daughter welcomed grandchild on yesterday  Hypertension History:      She denies headache, chest pain, and palpitations.  She notes no problems with any antihypertensive medication side effects.        Positive major cardiovascular risk factors include hypertension and current tobacco user.  Negative major cardiovascular risk factors include female age less than 55 years old and no history of  diabetes or hyperlipidemia.        Further assessment for target organ damage reveals no history of ASHD, cardiac end-organ damage (CHF/LVH), stroke/TIA, peripheral vascular disease, renal insufficiency, or hypertensive retinopathy.    Habits & Providers  Alcohol-Tobacco-Diet     Alcohol drinks/day: 0     Tobacco Status: current     Tobacco Counseling: to quit use of tobacco products     Cigarette Packs/Day: 0.5     Year Started: 1980  Exercise-Depression-Behavior     Does Patient Exercise: yes     Exercise Counseling: to improve exercise regimen     Type of exercise: walking     Exercise (avg: min/session): <30     Depression Counseling: further diagnostic testing and/or other treatment is indicated     Drug Use: no  Comments: PHQ- 9 score = 7  Current Medications (verified): 1)  Haldol Decanoate 50 Mg/ml  Soln (Haloperidol Decanoate) .... Given By [facility] 2)  Lisinopril-Hydrochlorothiazide 20-12.5 Mg Tabs (Lisinopril-Hydrochlorothiazide) .... Take 1 Tablet By Mouth Once A Day 3)  Levothroid 112 Mcg Tabs (Levothyroxine Sodium) .... One Tablet By Mouth Daily  Allergies (verified): 1)  ! * Latex  Review of Systems General:  Denies fever. Eyes:  Denies blurring. ENT:  Denies earache. CV:  Denies chest  pain or discomfort. Resp:  Denies cough. GI:  Denies abdominal pain. GU:  Denies discharge. MS:  Denies joint pain. Derm:  Denies dryness. Neuro:  Denies headaches. Psych:  Denies anxiety and depression.  Physical Exam  General:  alert.   Head:  normocephalic.   Eyes:  pupils round.   Ears:  ear piercing(s) noted.   Nose:  no nasal discharge.   Mouth:  poor dentition.   Neck:  supple.   Chest Wall:  no mass.   Breasts:  left breast s/p mastectomy right breast - no lumps noted Lungs:  no dullness.   Heart:  normal rate and regular rhythm.   Abdomen:  straie BS x 4 Rectal:  no external abnormalities.   Msk:  normal ROM.   Extremities:  no edema Neurologic:  gait  normal.   Skin:  color normal.   Psych:  Oriented X3.    Pelvic Exam  Vulva:      normal appearance.   Urethra and Bladder:      Urethra--normal.   Vagina:      physiologic discharge.   Cervix:      midposition.   Uterus:      smooth.   Adnexa:      nontender bilaterally.   Rectum:      normal, heme negative stool.      Impression & Recommendations:  Problem # 1:  ROUTINE GYNECOLOGICAL EXAMINATION (ICD-V72.31) PHQ-9 score = 7 PAP done rec optho and dental exams Orders: KOH/ WET Mount [phone]) Pap Smear, Thin Prep (60454) Rapid HIV  (92370) Hemoccult Guaiac-1 spec.(in office) (82270) Vision Screening (09811)  Problem # 2:  OTHER SCREENING BREAST EXAMINATION (ICD-V76.19) self breast exam placcard given to pt  pt has already scheduled her mammogram  Problem # 3:  HYPERTENSION, BENIGN ESSENTIAL (ICD-401.1)  Her updated medication list for this problem includes:    Lisinopril-hydrochlorothiazide 20-12.5 Mg Tabs (Lisinopril-hydrochlorothiazide) .Marland Kitchen... Take 1 tablet by mouth once a day  Orders: UA Dipstick w/o Micro (manual) (91478) T-Urine Microalbumin w/creat. ratio [phone])  Problem # 4:  TOBACCO ABUSE (ICD-305.1) advised cessation  Problem # 5:  HYPERTENSION (ICD-401.9) DASH diet continue current meds Her updated medication list for this problem includes:    Lisinopril-hydrochlorothiazide 20-12.5 Mg Tabs (Lisinopril-hydrochlorothiazide) .Marland Kitchen... Take 1 tablet by mouth once a day  Complete Medication List: 1)  Haldol Decanoate 50 Mg/ml Soln (Haloperidol decanoate) .... Given by [facility] 2)  Lisinopril-hydrochlorothiazide 20-12.5 Mg Tabs (Lisinopril-hydrochlorothiazide) .... Take 1 tablet by mouth once a day 3)  Levothroid 112 Mcg Tabs (Levothyroxine sodium) .... One tablet by mouth daily  Other Orders: EKG w/ Interpretation (93000) UA Dipstick w/o Micro (automated)  (81003) T-Comprehensive Metabolic Panel (69629-52841) T-CBC w/Diff (32440-10272) T-TSH  (53664-40347) Mammogram (Screening) (Mammo)  Hypertension Assessment/Plan:      The patient's hypertensive risk group is category B: At least one risk factor (excluding diabetes) with no target organ damage.  Her calculated 10 year risk of coronary heart disease is 20 %.  Today's blood pressure is 140/86.  Her blood pressure goal is < 140/90.   Patient Instructions: 1)  Schedule appointment for fasting labs - lipids 2)  no food after midnight before this visit 3)  Keep you appointment for mammogram 4)  You will be informed of the results of labs done in this office today. 5)  You have declined the flu vaccine.  If you change your mind then inform this office. 6)  Follow up as needed Prescriptions: LEVOTHROID 112 MCG  TABS (LEVOTHYROXINE SODIUM) one tablet by mouth daily  #30 x 11   Entered and Authorized by:   Lehman Prom FNP   Signed by:   Lehman Prom FNP on 05/06/2010   Method used:   Print then Give to Patient   RxID:   [id number] LISINOPRIL-HYDROCHLOROTHIAZIDE 20-12.5 MG TABS (LISINOPRIL-HYDROCHLOROTHIAZIDE) Take 1 tablet by mouth once a day  #30 x 11   Entered and Authorized by:   Lehman Prom FNP   Signed by:   Lehman Prom FNP on 05/06/2010   Method used:   Print then Give to Patient   RxID:   [id number]    Orders Added: 1)  EKG w/ Interpretation [93000] 2)  UA Dipstick w/o Micro (automated)  [81003] 3)  Est. Patient age 52-64 [31] 4)  UA Dipstick w/o Micro (manual) [81002] 5)  T-Urine Microalbumin w/creat. ratio [82043-[address]] 6)  KOH/ WET Mount [87210] 7)  Pap Smear, Thin Prep [88175] 8)  T-Comprehensive Metabolic Panel [80053-22900] 9)  T-CBC w/Diff [75643-32951] 10)  Rapid HIV  [92370] 11)  T-TSH [88416-60630] 12)  Hemoccult Guaiac-1 spec.(in office) [82270] 13)  Vision Screening [99173] 14)  Mammogram (Screening) [Mammo]   Not Administered:    Influenza Vaccine not given due to: declined     Laboratory Results   Urine  Tests  Date/Time Received: May 06, 2010 2:16 PM   Routine Urinalysis   Glucose: negative   (Normal Range: Negative) Bilirubin: negative   (Normal Range: Negative) Ketone: negative   (Normal Range: Negative) Spec. Gravity: >=1.030   (Normal Range: 1.003-1.035) Blood: negative   (Normal Range: Negative) pH: 6.0   (Normal Range: 5.0-8.0) Protein: 30   (Normal Range: Negative) Urobilinogen: 0.2   (Normal Range: 0-1) Nitrite: negative   (Normal Range: Negative) Leukocyte Esterace: negative   (Normal Range: Negative)    Date/Time Received: May 06, 2010 2:16 PM   Wet Mount Source: vaginal  WBC/hpf: 1-5 Bacteria/hpf: rare Clue cells/hpf: none Yeast/hpf: none Wet Mount KOH: Negative Trichomonas/hpf: none  Other Tests  Rapid HIV: negative  Stool - Occult Blood Hemmoccult #1: negative Date: 05/06/2010    Prevention & Chronic Care Immunizations   Influenza vaccine: Not documented   Influenza vaccine deferral: Refused  (05/06/2010)    Tetanus booster: 04/20/2007: historical per pt    Pneumococcal vaccine: Not documented  Colorectal Screening   Hemoccult: Not documented    Colonoscopy: historical per pt  (04/20/1999)  Other Screening   Pap smear: normal  (12/19/2007)    Mammogram: normal  (04/15/2009)   Mammogram action/deferral: Ordered  (05/06/2010)   Smoking status: current  (05/06/2010)   Smoking cessation counseling: yes  (06/24/2009)  Lipids   Total Cholesterol: 132  (07/08/2009)   LDL: 80  (07/08/2009)   LDL Direct: Not documented   HDL: 29.80  (07/08/2009)   Triglycerides: 113.0  (07/08/2009)  Hypertension   Last Blood Pressure: 140 / 86  (05/06/2010)   Serum creatinine: 0.9  (07/08/2009)   Serum potassium 3.6  (07/08/2009) CMP ordered   Self-Management Support :    Hypertension self-management support: Not documented   Nursing Instructions: Schedule screening mammogram (see order)

## 2010-05-21 NOTE — Letter (Signed)
Summary: *HSN Results Follow up  [facility]  [address] [place], [place] [address]   Phone: [phone]  Fax: [phone]      05/07/2010   Jacqueline Berry [address] [place], [place]  [address]  [place]   Dear  Ms. Jacqueline Berry,                            ____S.Drinkard,FNP   ____D. Gore,FNP       ____B. McPherson,MD   ____V. Rankins,MD    ____E. Mulberry,MD    __X__N. Daphine Deutscher, FNP  ____D. Reche Dixon, MD    ____K. Philipp Deputy, MD    ____Other     This letter is to inform you that your recent test(s):  ___X____Pap Smear    ___X____Lab Test     _______X-ray    ___X____ is within acceptable limits   Comments:  Labs done during recent office visit are ok.  Pap Smear results ______________________.       _________________________________________________________ If you have any questions, please contact our office [phone].                    Sincerely,    Jacqueline Prom FNP [facility]

## 2010-05-22 ENCOUNTER — Encounter (INDEPENDENT_AMBULATORY_CARE_PROVIDER_SITE_OTHER): Payer: Self-pay | Admitting: Nurse Practitioner

## 2010-05-27 NOTE — Letter (Signed)
Summary: Lipid Letter  [facility]  [address] [place], [place] [address]   Phone: [phone]  Fax: [phone]    05/22/2010  Jacqueline Berry [address] [place], [place]  [address]  Dear [name]:  We have carefully reviewed your last lipid profile from 05/21/2010 and the results are noted below with a summary of recommendations for lipid management.    Cholesterol:       154     Goal: less than 200   HDL "good" Cholesterol:   37     Goal: greater than 40   LDL "bad" Cholesterol:   90     Goal: less than 100   Triglycerides:       133     Goal: less than 150    Cholesterol labs are ok.    Current Medications: 1)    Haldol Decanoate 50 Mg/ml  Soln (Haloperidol decanoate) .... Given by [facility] 2)    Lisinopril-hydrochlorothiazide 20-12.5 Mg Tabs (Lisinopril-hydrochlorothiazide) .... Take 1 tablet by mouth once a day 3)    Levothroid 112 Mcg Tabs (Levothyroxine sodium) .... One tablet by mouth daily  If you have any questions, please call. We appreciate being able to work with you.   Sincerely,    Lehman Prom, FNP [facility]

## 2010-08-26 ENCOUNTER — Inpatient Hospital Stay (INDEPENDENT_AMBULATORY_CARE_PROVIDER_SITE_OTHER)
Admission: RE | Admit: 2010-08-26 | Discharge: 2010-08-26 | Disposition: A | Discharge status: Home / Self Care | Payer: 59 | Source: Ambulatory Visit | Attending: Family Medicine | Admitting: Family Medicine

## 2010-08-26 DIAGNOSIS — K029 Dental caries, unspecified: Secondary | ICD-10-CM

## 2010-09-01 NOTE — Discharge Summary (Signed)
NAME:  Jacqueline Berry, Jacqueline Berry                ACCOUNT NO.:  [id number]   MEDICAL RECORD NO.:  [id number]          PATIENT TYPE:  INP   LOCATION:  1438                         FACILITY:  [facility]   PHYSICIAN:  Hind I Elsaid, MD      DATE OF BIRTH:  29-Feb-1961   DATE OF ADMISSION:  03/17/2008  DATE OF DISCHARGE:  03/18/2008                               DISCHARGE SUMMARY   DISCHARGE DIAGNOSES:  1. Atypical chest pain.  2. Upper left scapular pain.  3. Hypertension.  4. History of breast cancer status post lumpectomy.  5. History of smoking.   DISCHARGE MEDICATIONS:  1. Vicodin 5/300 one to two tabs q.6 hours p.r.n.  2. Synthroid 100 mcg daily.  3. Lisinopril/hydrochlorothiazide 20/12.5 mg daily.   CONSULTATIONS:  None.   PROCEDURES:  Chest x-ray, no active cardiopulmonary disease.   HISTORY OF PRESENT ILLNESS:  This is a 50 year old female admitted to  the hospital with chest pain radiating to her arm and to her back.  Admitted to rule out ACS.  On close questioning, the patient admitted  she has above left scapular pain radiating to her arm, shooting like  electricity, which resolved at this time.  The patient has already  stress test done by Dr. Donnie Aho on September or August of this year.  Results were negative.  The patient admitted to telemetry, cycling  cardiac enzymes x3 were negative.  D-dimer was within normal limits.  The patient's symptoms completely resolved.  EKG no evidence of ST  segment depression or elevation.  The patient is given prescription for  Vicodin to control her back pain and asked to follow up with her primary  care physician on Tuesday or Wednesday for further evaluation of her  back pain.  Was felt the chest pain is atypical in nature, and the  patient resume her home medications.      Hind Bosie Helper, MD  Electronically Signed     HIE/MEDQ  D:  03/18/2008  T:  03/18/2008  Job:  [id number]

## 2010-09-01 NOTE — H&P (Signed)
NAME:  Jacqueline Berry, Jacqueline Berry NO.:  [id number]   MEDICAL RECORD NO.:  [id number]          PATIENT TYPE:  INP   LOCATION:  1438                         FACILITY:  [facility]   PHYSICIAN:  Della Goo, M.D. DATE OF BIRTH:  Jan 05, 1961   DATE OF ADMISSION:  03/17/2008  DATE OF DISCHARGE:                              HISTORY & PHYSICAL   ADMISSION DATE:  March 17, 2008.   PRIMARY CARE PHYSICIAN:  Dr. Leslee Home.   CHIEF COMPLAINT:  Chest pain.   HISTORY OF PRESENT ILLNESS:  This is a 50 year old female who presented  to the emergency department with complaints of severe chest pain and  back pain.  She states that the pain awakened her from a nap this  afternoon at approximately 4:15 p.m.  She states the pain was dull,  intermittent and worsening and had radiation under the left breast and  into the left upper arm.  She states that the discomfort in the left arm  felt like numbness and tingling.  She described the pain as being a 5/10  at the worst.  She denies having any nausea, vomiting, shortness of  breath or diaphoresis associated with this discomfort.  She does state  that she had previous chest pain approximately 1 year ago and was  evaluated by her primary care physician.  She had an EKG performed which  she reports being told that there was evidence that she had, had a  previous myocardial infarction.  The patient states that she was  referred for a cardiac evaluation and saw Dr. Donnie Aho, cardiology and had  a stress test performed at that time.  The patient does have risk  factors of a family history of coronary artery disease, had a brother  who suffered an MI at age 45.  She also is a smoker and smokes one-half  pack per day of cigarettes for 35 years.   PAST MEDICAL HISTORY:  1. Breast cancer left breast, status post mastectomy in 1996 with      chemotherapy treatment.  2. History of hypothyroidism.  3. Hypertension.  4. Reported history of psychosis.  5. The patient reports having two previous nervous breakdowns and      being seen at the [facility] for monthly      injections of Haldol.   PAST SURGICAL HISTORY:  As mentioned above left mastectomy, but also  history of bilateral tubal ligation.   MEDICATIONS:  1. Levothroid 100 mcg 1 p.o. daily.  2. Lisinopril/hydrochlorothiazide 20/12.5 one p.o. daily.  3. Haldol injections monthly by the [facility].   ALLERGIES:  NO KNOWN DRUG ALLERGIES.   SOCIAL HISTORY:  The patient is married with three children.  She is a  smoker of one-half pack daily for 35 years.  She is a nondrinker and no  history of illicit drug usage.   FAMILY HISTORY:  Positive for coronary artery disease in her brother  with an MI at age 51.  Positive for diabetes in another brother.  Positive for hypertension in her father and her sister, and her father  died of a cerebrovascular accident.  Also cancer in her sister and  mother who both had breast cancer.   REVIEW OF SYSTEMS:  Pertinents are mentioned above.  The patient denies  having any nausea, vomiting, diarrhea, constipation, fevers, chills,  congestion, syncope, lightheadedness, dizziness, decreased level of  consciousness, blurry vision, headache, dyspnea on exertion, edema,  changes in her bowel or bladder habits, weight loss or weight gain,  myalgias or arthralgias, any neurologic problems.   PHYSICAL EXAMINATION:  GENERAL:  This is a 50 year old overweight female  in discomfort, but no acute distress currently.  VITAL SIGNS:  Temperature 97.5, blood pressure 127/85, heart rate 76,  respirations 19.  O2 sats 99-100%.  HEENT:  Normocephalic, atraumatic.  Pupils equally round and reactive to  light.  Extraocular movements are intact.  Funduscopic benign.  There is  no scleral icterus.  Oropharynx is clear.  NECK:  Supple.  Full range of motion.  No thyromegaly, adenopathy or  jugulovenous distention.   CARDIOVASCULAR:  Regular rate and rhythm.  No  murmurs, gallops or rubs.  LUNGS:  Clear to auscultation bilaterally.  CHEST WALL:  There is no tenderness of the chest wall.  The patient has  a left-sided mastectomy.  ABDOMEN:  Positive bowel sounds.  Soft,  nontender, nondistended.  EXTREMITIES:  Without cyanosis, clubbing or edema.  NEUROLOGIC:  Nonfocal.   LABORATORY STUDIES:  Chemistry performed:  Sodium of 143, potassium 3.6,  chloride 106, bicarb 28, BUN 10, creatinine 1.1 and glucose 72.  Point  of care cardiac markers with a myoglobin of 48.0, CK-MB less than 1.0,  troponin less than 0.05.  Chest x-ray reveals no acute disease process.  EKG reveals a normal sinus rhythm without any acute changes compared to  the previous EKG, which revealed a small right bundle branch block.   ASSESSMENT:  A 50 year old female being admitted with;  1. Chest pain.  2. Hypertension.  3. Hypothyroidism.  4. Tobacco abuse.  5. History of breast cancer.   PLAN:  The patient will be admitted to a telemetry area for cardiac  monitoring.  Cardiac enzymes will be performed.  The patient will be  placed on Nitropaste, oxygen and aspirin therapy.  She will continue on  her ACE inhibitor therapy and beta-blocker therapy at low-dose will be  started as well.  A D-dimer has been ordered and the patient will be  placed on DVT and GI prophylaxis.  Pain control therapy and antiemetic  therapy have also been ordered as needed.  Further cardiac evaluation  will be considered whether to be done as an inpatient or outpatient  pending results of the patient's cardiac studies and her clinical  course.      Della Goo, M.D.  Electronically Signed    HJ/MEDQ  D:  03/18/2008  T:  03/18/2008  Job:  [id number]   cc:   Reuben Likes, M.D.  Fax: [phone]

## 2010-09-04 NOTE — Procedures (Signed)
[place]  Patient:    Jacqueline Berry, Jacqueline Berry                       MRN: [id number] Proc. Date: 10/16/99 Adm. Date:  [id number] Attending:  Sabino Gasser Dictator:   Sabino Gasser, M.D. CC:         Ammie Dalton, M.D.                           Procedure Report  PROCEDURE:  Colonoscopy.  INDICATIONS:  Rectal bleeding, presumed to be hemorrhoidal.  Family history of colon cancer in her mother at age 51.  ANESTHESIA: 1. Demerol 75 mg. 2. Versed 7.5 mg.  Given intravenously in divided doses.  DESCRIPTION OF PROCEDURE:  With the patient mildly sedated in the left lateral decubitus position, the Olympus videoscopic colonoscope was inserted in the rectum after rectal examination, and passed under direct vision to the cecum. The cecum identified by ileocecal valve and appendiceal orifice, both of which were photographed.  We entered into the terminal ileum through the widely patent ileocecal valve, and this appeared normal and was photographed as well. From this point the colonoscope was then slowly withdrawn, taking circumferential views of the entire colonic mucosa, stopping only then in the rectum which appeared normal in direct view, and showed one small internal hemorrhoid on retroflex view.  Photographs were taken.  The endoscope was straightened and withdrawn.  The patients vital signs and pulse oximetry remained stable.  The patient tolerated the procedure well without apparent complications.  FINDINGS:  Internal hemorrhoid, otherwise unremarkable examination.  PLAN:  Repeat examination in approximately five years. DD:  10/16/99 TD:  10/17/99 Job: [id number]

## 2010-09-04 NOTE — Discharge Summary (Signed)
NAME:  Jacqueline Berry, Jacqueline Berry                ACCOUNT NO.:  [id number]   MEDICAL RECORD NO.:  [id number]          PATIENT TYPE:  INP   LOCATION:  1438                         FACILITY:  [facility]   PHYSICIAN:  Hind I Elsaid, MD      DATE OF BIRTH:  Mar 04, 1961   DATE OF ADMISSION:  03/17/2008  DATE OF DISCHARGE:  03/18/2008                               DISCHARGE SUMMARY   DISCHARGE DIAGNOSES:  1. Atypical chest pain.  2. Chronic back pain.  3. Hypertension.  4. History of breast cancer.  5. History of hypothyroidism.  6. History of psychosis medication.   MEDICATIONS:  1. Synthroid 130 one tab daily.  2. Lisinopril /hydrochlorothiazide 1 tab daily.  3. Haldol injection monthly by [facility].  4. Vicodin 5/300 one tab q.6 hours.   CONSULTATIONS:  None.   X-RAY/PROCEDURES:  Chest x-ray:  Borderline heart size,and no acute  cardiopulmonary process.   HISTORY OF PRESENT ILLNESS:  This 50 year old female presented to the  emergency room was complaining of fevers, chest pain, and back pain.  The patient admitted to the hospital for evaluation of her chest pain.  The patient admitted to telemetry.  Cardiac enzymes x3 were negative, D-  dimer was negative.  The patient felt her pain secondary to muscular and  asked to follow up with her cardiologist, Dr. Donnie Aho, as an outpatient.  Also, follow with her primary care physician.      Hind Bosie Helper, MD  Electronically Signed     HIE/MEDQ  D:  06/19/2008  T:  06/19/2008  Job:  [id number]

## 2011-01-19 LAB — POCT CARDIAC MARKERS
CKMB, poc: <1 — ABNORMAL LOW
Myoglobin, poc: 43.5
Troponin i, poc: <0.05
Troponin i, poc: <0.05

## 2011-01-19 LAB — DIFFERENTIAL
Eosinophils Absolute: 0.1
Lymphocytes Relative: 58 — ABNORMAL HIGH
Lymphs Abs: 3.2
Neutro Abs: 1.9
Neutrophils Relative %: 34 — ABNORMAL LOW

## 2011-01-19 LAB — BASIC METABOLIC PANEL
BUN: 10
Calcium: 8.6
Creatinine, Ser: 0.77
GFR calc non Af Amer: >60

## 2011-01-19 LAB — CBC
MCV: 88.3
Platelets: 201
WBC: 5.5

## 2011-01-19 LAB — POCT I-STAT, CHEM 8
Calcium, Ion: 1.06 — ABNORMAL LOW
Chloride: 106
HCT: 42
Hemoglobin: 14.3
Potassium: 3.6

## 2011-01-19 LAB — TSH
TSH: 3.029
TSH: 5.218 — ABNORMAL HIGH

## 2011-01-19 LAB — CK TOTAL AND CKMB (NOT AT ARMC): CK, MB: 1.2

## 2011-05-23 ENCOUNTER — Emergency Department (INDEPENDENT_AMBULATORY_CARE_PROVIDER_SITE_OTHER)
Admission: EM | Admit: 2011-05-23 | Discharge: 2011-05-23 | Disposition: A | Discharge status: Home / Self Care | Payer: 59 | Source: Home / Self Care | Attending: Family Medicine | Admitting: Family Medicine

## 2011-05-23 ENCOUNTER — Encounter (HOSPITAL_COMMUNITY): Payer: Self-pay | Admitting: *Deleted

## 2011-05-23 DIAGNOSIS — E039 Hypothyroidism, unspecified: Secondary | ICD-10-CM

## 2011-05-23 DIAGNOSIS — I10 Essential (primary) hypertension: Secondary | ICD-10-CM

## 2011-05-23 HISTORY — DX: Disorder of thyroid, unspecified: E07.9

## 2011-05-23 HISTORY — DX: Malignant neoplasm of unspecified site of unspecified female breast: C50.919

## 2011-05-23 HISTORY — DX: Bipolar disorder, unspecified: F31.9

## 2011-05-23 HISTORY — DX: Essential (primary) hypertension: I10

## 2011-05-23 MED ORDER — LISINOPRIL-HYDROCHLOROTHIAZIDE 20-12.5 MG PO TABS: 1.0000 | ORAL_TABLET | Freq: Every day | ORAL | Status: DC

## 2011-05-23 MED ORDER — LEVOTHYROXINE SODIUM 112 MCG PO TABS: 112.0000 ug | ORAL_TABLET | Freq: Every day | ORAL | Status: DC

## 2011-05-23 NOTE — ED Notes (Signed)
Pt presents for refill of thyroid med and HTN med.  Denies any c/o's.  States ran out of meds this morning.  Does not have appt set up w/ PCP.

## 2011-05-23 NOTE — ED Provider Notes (Signed)
History     CSN: [id number]  Arrival date & time 05/23/11  1208   First MD Initiated Contact with Patient 05/23/11 1231      Chief Complaint  Patient presents with  . Medication Refill    (Consider location/radiation/quality/duration/timing/severity/associated sxs/prior treatment) HPI Comments: Jacqueline Berry presents for evaluation simply for medication refill. She has a history of hypertension and hypothyroidism. She is in the process of changing primary care providers. She has not yet set one up. She denies any symptoms today. She denies any chest pain, shortness of breath, fast heartbeat, visual disturbances, or abdominal pain.  Patient is a 51 y.o. female presenting with hypertension. The history is provided by the patient.  Hypertension This is a chronic problem. The current episode started more than 1 week ago. The problem has not changed since onset.Pertinent negatives include no chest pain, no headaches and no shortness of breath. The symptoms are aggravated by nothing. The symptoms are relieved by medications.    Past Medical History  Diagnosis Date  . Bipolar disorder   . Breast cancer     with chemo - 1997  . Hypertension   . Thyroid disease     Past Surgical History  Procedure Date  . Mastectomy   . Tubal ligation     No family history on file.  History  Substance Use Topics  . Smoking status: Current Everyday Smoker -- 0.5 packs/day  . Smokeless tobacco: Not on file  . Alcohol Use: No    OB History    Grav Para Term Preterm Abortions TAB SAB Ect Mult Living                  Review of Systems  Constitutional: Negative.   HENT: Negative.   Eyes: Negative.   Respiratory: Negative.  Negative for shortness of breath.   Cardiovascular: Negative.  Negative for chest pain.  Gastrointestinal: Negative.   Genitourinary: Negative.   Musculoskeletal: Negative.   Skin: Negative.   Neurological: Negative.  Negative for headaches.  Psychiatric/Behavioral: Negative.      Allergies  Latex  Home Medications   Current Outpatient Rx  Name Route Sig Dispense Refill  . HALOPERIDOL 2 MG PO TABS Oral Take 2 mg by mouth daily.    Marland Kitchen LEVOTHYROXINE SODIUM 112 MCG PO TABS Oral Take 1 tablet (112 mcg total) by mouth daily. 30 tablet 1  . LISINOPRIL-HYDROCHLOROTHIAZIDE 20-12.5 MG PO TABS Oral Take 1 tablet by mouth daily. 30 tablet 1    BP 137/94  Pulse 79  Temp(Src) 98.2 F (36.8 C) (Oral)  Resp 18  SpO2 98%  Physical Exam  Nursing note and vitals reviewed. Constitutional: She is oriented to person, place, and time. She appears well-developed and well-nourished.  HENT:  Head: Normocephalic and atraumatic.  Eyes: Conjunctivae and EOM are normal. Pupils are equal, round, and reactive to light.  Neck: Normal range of motion.  Cardiovascular: Normal rate, regular rhythm and normal heart sounds.   No murmur heard. Pulmonary/Chest: Effort normal and breath sounds normal. She has no decreased breath sounds. She has no wheezes. She has no rhonchi.  Musculoskeletal: Normal range of motion.  Neurological: She is alert and oriented to person, place, and time.  Skin: Skin is warm and dry.  Psychiatric: Her behavior is normal.    ED Course  Procedures (including critical care time)  Labs Reviewed - No data to display No results found.   1. Hypertension   2. Hypothyroidism  MDM  Refilled lisinopril-HCTZ and levothyroxine; referred to [facility] to find new provider.        Richardo Priest, MD 05/23/11 1312

## 2011-05-25 ENCOUNTER — Emergency Department (HOSPITAL_COMMUNITY)
Admission: EM | Admit: 2011-05-25 | Discharge: 2011-05-25 | Disposition: A | Discharge status: Other Acute Inpatient Hospital | Payer: PRIVATE HEALTH INSURANCE | Attending: Emergency Medicine | Admitting: Emergency Medicine

## 2011-05-25 ENCOUNTER — Inpatient Hospital Stay (HOSPITAL_COMMUNITY): Admission: EM | Admit: 2011-05-25 | Payer: PRIVATE HEALTH INSURANCE | Source: Ambulatory Visit | Admitting: Psychiatry

## 2011-05-25 ENCOUNTER — Encounter (HOSPITAL_COMMUNITY): Payer: Self-pay | Admitting: *Deleted

## 2011-05-25 ENCOUNTER — Encounter (HOSPITAL_COMMUNITY): Payer: Self-pay | Admitting: Emergency Medicine

## 2011-05-25 ENCOUNTER — Inpatient Hospital Stay (HOSPITAL_COMMUNITY)
Admission: RE | Admit: 2011-05-25 | Discharge: 2011-05-27 | DRG: 885 | Disposition: A | Discharge status: Home / Self Care | Payer: PRIVATE HEALTH INSURANCE | Attending: Psychiatry | Admitting: Psychiatry

## 2011-05-25 DIAGNOSIS — Z853 Personal history of malignant neoplasm of breast: Secondary | ICD-10-CM

## 2011-05-25 DIAGNOSIS — F319 Bipolar disorder, unspecified: Secondary | ICD-10-CM

## 2011-05-25 DIAGNOSIS — I10 Essential (primary) hypertension: Secondary | ICD-10-CM | POA: Insufficient documentation

## 2011-05-25 DIAGNOSIS — IMO0002 Reserved for concepts with insufficient information to code with codable children: Secondary | ICD-10-CM

## 2011-05-25 DIAGNOSIS — F200 Paranoid schizophrenia: Principal | ICD-10-CM

## 2011-05-25 DIAGNOSIS — Z79899 Other long term (current) drug therapy: Secondary | ICD-10-CM | POA: Insufficient documentation

## 2011-05-25 DIAGNOSIS — F17200 Nicotine dependence, unspecified, uncomplicated: Secondary | ICD-10-CM | POA: Insufficient documentation

## 2011-05-25 DIAGNOSIS — Z8659 Personal history of other mental and behavioral disorders: Secondary | ICD-10-CM | POA: Insufficient documentation

## 2011-05-25 DIAGNOSIS — F411 Generalized anxiety disorder: Secondary | ICD-10-CM | POA: Insufficient documentation

## 2011-05-25 DIAGNOSIS — E079 Disorder of thyroid, unspecified: Secondary | ICD-10-CM | POA: Insufficient documentation

## 2011-05-25 DIAGNOSIS — Z91040 Latex allergy status: Secondary | ICD-10-CM

## 2011-05-25 DIAGNOSIS — F419 Anxiety disorder, unspecified: Secondary | ICD-10-CM

## 2011-05-25 LAB — URINALYSIS, ROUTINE W REFLEX MICROSCOPIC
Bilirubin Urine: NEGATIVE
Ketones, ur: NEGATIVE mg/dL
Leukocytes, UA: NEGATIVE
Nitrite: NEGATIVE
Protein, ur: NEGATIVE mg/dL

## 2011-05-25 LAB — CBC
HCT: 40.8 % (ref 36.0–46.0)
Platelets: 291 10*3/uL (ref 150–400)
RDW: 13.9 % (ref 11.5–15.5)
WBC: 7.9 10*3/uL (ref 4.0–10.5)

## 2011-05-25 LAB — RAPID URINE DRUG SCREEN, HOSP PERFORMED
Amphetamines: NOT DETECTED
Benzodiazepines: NOT DETECTED
Opiates: NOT DETECTED

## 2011-05-25 LAB — BASIC METABOLIC PANEL
BUN: 13 mg/dL (ref 6–23)
Chloride: 105 mEq/L (ref 96–112)
GFR calc Af Amer: >90 mL/min (ref >90)
Potassium: 3.5 mEq/L (ref 3.5–5.1)

## 2011-05-25 MED ORDER — IBUPROFEN 600 MG PO TABS: 600.0000 mg | ORAL_TABLET | Freq: Three times a day (TID) | ORAL | Status: DC | PRN

## 2011-05-25 MED ORDER — ACETAMINOPHEN 325 MG PO TABS: 650.0000 mg | ORAL_TABLET | Freq: Four times a day (QID) | ORAL | Status: DC | PRN

## 2011-05-25 MED ORDER — HALOPERIDOL 2 MG PO TABS
2.0000 mg | ORAL_TABLET | Freq: Every day | ORAL | Status: DC
  Administered 2011-05-25: 2 mg via ORAL
  Filled 2011-05-25: qty 1

## 2011-05-25 MED ORDER — MAGNESIUM HYDROXIDE 400 MG/5ML PO SUSP: 30.0000 mL | Freq: Every day | ORAL | Status: DC | PRN

## 2011-05-25 MED ORDER — HYDROCHLOROTHIAZIDE 12.5 MG PO CAPS
12.5000 mg | ORAL_CAPSULE | Freq: Every day | ORAL | Status: DC
  Administered 2011-05-26 – 2011-05-27 (×2): 12.5 mg via ORAL
  Filled 2011-05-25 (×4): qty 1

## 2011-05-25 MED ORDER — TRAZODONE HCL 100 MG PO TABS
100.0000 mg | ORAL_TABLET | Freq: Every day | ORAL | Status: DC
  Filled 2011-05-25: qty 1
  Filled 2011-05-25: qty 14
  Filled 2011-05-25 (×2): qty 1

## 2011-05-25 MED ORDER — LISINOPRIL 20 MG PO TABS
20.0000 mg | ORAL_TABLET | Freq: Every day | ORAL | Status: DC
  Administered 2011-05-26 – 2011-05-27 (×2): 20 mg via ORAL
  Filled 2011-05-25 (×4): qty 1

## 2011-05-25 MED ORDER — ONDANSETRON HCL 4 MG PO TABS: 4.0000 mg | ORAL_TABLET | Freq: Three times a day (TID) | ORAL | Status: DC | PRN

## 2011-05-25 MED ORDER — ALUM & MAG HYDROXIDE-SIMETH 200-200-20 MG/5ML PO SUSP: 30.0000 mL | ORAL | Status: DC | PRN

## 2011-05-25 MED ORDER — LEVOTHYROXINE SODIUM 112 MCG PO TABS
112.0000 ug | ORAL_TABLET | Freq: Every day | ORAL | Status: DC
  Administered 2011-05-25: 112 ug via ORAL
  Filled 2011-05-25: qty 1

## 2011-05-25 MED ORDER — NICOTINE 21 MG/24HR TD PT24
21.0000 mg | MEDICATED_PATCH | Freq: Every day | TRANSDERMAL | Status: DC
  Administered 2011-05-25: 21 mg via TRANSDERMAL
  Filled 2011-05-25 (×2): qty 1

## 2011-05-25 MED ORDER — HYDROCHLOROTHIAZIDE 12.5 MG PO CAPS
12.5000 mg | ORAL_CAPSULE | Freq: Every day | ORAL | Status: DC
  Administered 2011-05-25: 12.5 mg via ORAL
  Filled 2011-05-25: qty 1

## 2011-05-25 MED ORDER — LISINOPRIL 20 MG PO TABS
20.0000 mg | ORAL_TABLET | Freq: Every day | ORAL | Status: DC
  Administered 2011-05-25: 20 mg via ORAL
  Filled 2011-05-25: qty 1

## 2011-05-25 MED ORDER — ACETAMINOPHEN 325 MG PO TABS: 650.0000 mg | ORAL_TABLET | ORAL | Status: DC | PRN

## 2011-05-25 MED ORDER — ZOLPIDEM TARTRATE 5 MG PO TABS: 5.0000 mg | ORAL_TABLET | Freq: Every evening | ORAL | Status: DC | PRN

## 2011-05-25 MED ORDER — LORAZEPAM 1 MG PO TABS: 1.0000 mg | ORAL_TABLET | Freq: Three times a day (TID) | ORAL | Status: DC | PRN

## 2011-05-25 MED ORDER — NICOTINE 21 MG/24HR TD PT24
21.0000 mg | MEDICATED_PATCH | Freq: Every day | TRANSDERMAL | Status: DC
  Administered 2011-05-26 – 2011-05-27 (×2): 21 mg via TRANSDERMAL
  Filled 2011-05-25 (×4): qty 1

## 2011-05-25 MED ORDER — NICOTINE 21 MG/24HR TD PT24
21.0000 mg | MEDICATED_PATCH | Freq: Every day | TRANSDERMAL | Status: DC
  Administered 2011-05-25: 21 mg via TRANSDERMAL

## 2011-05-25 MED ORDER — LEVOTHYROXINE SODIUM 112 MCG PO TABS
112.0000 ug | ORAL_TABLET | Freq: Every day | ORAL | Status: DC
  Administered 2011-05-26 – 2011-05-27 (×2): 112 ug via ORAL
  Filled 2011-05-25 (×2): qty 1
  Filled 2011-05-25: qty 7
  Filled 2011-05-25 (×2): qty 1

## 2011-05-25 MED ORDER — LISINOPRIL-HYDROCHLOROTHIAZIDE 20-12.5 MG PO TABS: 1.0000 | ORAL_TABLET | Freq: Every day | ORAL | Status: DC

## 2011-05-25 MED ORDER — HALOPERIDOL 1 MG PO TABS
2.0000 mg | ORAL_TABLET | Freq: Every day | ORAL | Status: DC
  Administered 2011-05-25: 2 mg via ORAL
  Filled 2011-05-25 (×2): qty 2

## 2011-05-25 NOTE — ED Notes (Signed)
Pt reports having hx of depression, states she went over to [facility] for that reason, however report from [facility] and triage nurse, pt is seeing people and she feels like someone is following. Pt denies that to Clinical research associate. Pt reports driving her car to [facility] and was told to come over to ED to get medically cleared and she can come back to be admitted. Pt reports taking medication for depression but does not take medication as prescribed, says she takes less than she is supposed to. Pt denies SI/HI/VH/AH. Pt vague/guarded and does not appear to be telling Clinical research associate the whole story of why she is here/seeking inpt psych tx.

## 2011-05-25 NOTE — Progress Notes (Signed)
Patient denied SI and HI, contracts for safety.   Denied hearing voices.   Believes people are following her from the loan company and [place].   Has been cooperative and pleasant.   Going to groups and to dining room for meals.
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
## 2011-05-25 NOTE — ED Notes (Signed)
Pt placed in blue scrubs and wanded by security  Comfort measures performed  Warm blankets and pillow given

## 2011-05-25 NOTE — ED Provider Notes (Signed)
Patient accepted by [facility].  I have completed the transfer forms and should be going there this morning.  No new issues overnight, resting comfortably this morning.  Geoffery Lyons, MD 05/25/11 [phone]

## 2011-05-25 NOTE — ED Notes (Signed)
Paperwork accompanying pt states the pt has paranoid schizophrenia and states she is seeing people following her
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
## 2011-05-25 NOTE — ED Notes (Signed)
Pt states she went over to [facility] because her nerves are bad and they sent her here for medical clearance  Pt denies SI/HI

## 2011-05-25 NOTE — BH Assessment (Signed)
Assessment Note   Jacqueline Berry is an 51 yo female who presented to [facility] as a walk-in complaining of feeling overwhelmed by her paranoid behavior, seeing people following her all the time. Pt reports that she has a job but does not feel comfortable going to work anymore "because people look at me and laugh at me,  They say I am crazy".  Patient reports that she has lost several jobs previously secondary to her schizophrenic symptoms. Reports that she has she now is afraid to go back to work "because I may mess up the machines".  Pt reports that she used to tale haldol IM and this was changed to Haldol PO and thinks that this has not been working. Denies SI/HI. Has a medical history of HTN for which she takes Lisinopril 20mg  in HCTZ 12.5 daily. She also has Hx of hypothyrodism for which she takes Synthroid 112 mcg  Daily. Reports having multiple stressors including financial issues. Does not use any illegal drugs nor alcohol.  Pt is accepted to [facility] inpatient but  was sent to [facility] for medical clearence per Armandina Stammer, NP.   Axis I: Schizoaffective Disorder Axis II: Deferred Axis III:  Past Medical History  Diagnosis Date  . Bipolar disorder   . Breast cancer     with chemo - 1997  . Hypertension   . Thyroid disease    Axis IV: economic problems, occupational problems, other psychosocial or environmental problems and problems related to social environment Axis V: 21-30 behavior considerably influenced by delusions or hallucinations OR serious impairment in judgment, communication OR inability to function in almost all areas  Past Medical History:  Past Medical History  Diagnosis Date  . Bipolar disorder   . Breast cancer     with chemo - 1997  . Hypertension   . Thyroid disease     Past Surgical History  Procedure Date  . Mastectomy   . Tubal ligation     Family History: one brother was alcoholic and one brother with substance induced psychosis  Social History:  reports that she  has been smoking.  She does not have any smokeless tobacco history on file. She reports that she does not drink alcohol or use illicit drugs.  Additional Social History:    Allergies:  Allergies  Allergen Reactions  . Latex     REACTION: itchiong    Home Medications:  No current facility-administered medications on file as of .   Medications Prior to Admission  Medication Sig Dispense Refill  . haloperidol (HALDOL) 2 MG tablet Take 2 mg by mouth daily.      Marland Kitchen levothyroxine (SYNTHROID, LEVOTHROID) 112 MCG tablet Take 1 tablet (112 mcg total) by mouth daily.  30 tablet  1  . lisinopril-hydrochlorothiazide (PRINZIDE,ZESTORETIC) 20-12.5 MG per tablet Take 1 tablet by mouth daily.  30 tablet  1    OB/GYN Status:  No LMP recorded. Patient is not currently having periods (Reason: Perimenopausal).  General Assessment Data Location of Assessment: [facility] Assessment Services Living Arrangements: Spouse/significant other Can pt return to current living arrangement?: Yes Admission Status: Voluntary Is patient capable of signing voluntary admission?: Yes Transfer from: Home  Education Status Is patient currently in school?: No Current Grade: na Highest grade of school patient has completed: unknown Name of school: NA Contact person: timothy Brazzel (Spouse  [phone])  Risk to self Suicidal Ideation: No Suicidal Intent: No Is patient at risk for suicide?: Yes Suicidal Plan?: No Access to Means:  No What has been your use of drugs/alcohol within the last 12 months?: na Previous Attempts/Gestures: No How many times?: 0  Other Self Harm Risks: na Triggers for Past Attempts: None known;Unknown Intentional Self Injurious Behavior: None Family Suicide History: No Recent stressful life event(s): Loss (Comment) (multiple job losses. but currently works) Persecutory voices/beliefs?: No Depression: Yes Depression Symptoms: Insomnia;Tearfulness;Feeling worthless/self pity Substance abuse  history and/or treatment for substance abuse?: No Suicide prevention information given to non-admitted patients: Not applicable  Risk to Others Homicidal Ideation: No Thoughts of Harm to Others: No Current Homicidal Intent: No Current Homicidal Plan: No Access to Homicidal Means: No Identified Victim: na History of harm to others?: No Assessment of Violence: None Noted Violent Behavior Description: na Does patient have access to weapons?: No Criminal Charges Pending?: No Does patient have a court date: No  Psychosis Hallucinations: Visual (pt reports seeing people following her.) Delusions: None noted  Mental Status Report Appear/Hygiene: Poor hygiene Eye Contact: Poor Motor Activity: Gestures Speech: Pressured Level of Consciousness: Alert Mood: Depressed Affect: Blunted;Sad Anxiety Level: Moderate Thought Processes: Irrelevant Judgement: Impaired Orientation: Person;Place;Time;Situation Obsessive Compulsive Thoughts/Behaviors: Moderate  Cognitive Functioning Concentration: Decreased Memory: Recent Intact IQ: Average Insight: Poor Impulse Control: Fair Appetite: Good Weight Loss: 0  Weight Gain: 0  Sleep: Decreased Total Hours of Sleep: 2  Vegetative Symptoms: Not bathing;Decreased grooming  Prior Inpatient Therapy Prior Inpatient Therapy: Yes Prior Therapy Dates: 1990 Prior Therapy Facilty/Provider(s): [facility] Reason for Treatment: schizophrenia (with paranoia)  Prior Outpatient Therapy Prior Outpatient Therapy: Yes Prior Therapy Dates: unknown Prior Therapy Facilty/Provider(s): [facility] Reason for Treatment: paranoia                     Additional Information 1:1 In Past 12 Months?: No CIRT Risk: No Elopement Risk: No Does patient have medical clearance?: No     Disposition:  Disposition Disposition of Patient: Inpatient treatment program (sent to [facility] for medical clearence but admitted to [facility]) Type of inpatient treatment program:  Adult  On Site Evaluation by:   Reviewed with Physician:     Olin Pia 05/25/2011 3:35 AM
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
## 2011-05-25 NOTE — ED Provider Notes (Signed)
History     CSN: [id number]  Arrival date & time 05/25/11  0314   First MD Initiated Contact with Patient 05/25/11 0522      Chief Complaint  Patient presents with  . Medical Clearance    (Consider location/radiation/quality/duration/timing/severity/associated sxs/prior treatment) HPI Patient is a 51 year old female with history of paranoid schizophrenia. She presented to behavioral health this evening and was referred here for medical clearance. The patient denies suicidal or homicidal ideation. She just reports that she's been having difficulty with her "nerves". She thinks that she needs to be seen today. She denies hearing or seeing things that aren't there but provides limited history. She has no other complaints. Past Medical History  Diagnosis Date  . Bipolar disorder   . Breast cancer     with chemo - 1997  . Hypertension   . Thyroid disease     Past Surgical History  Procedure Date  . Mastectomy   . Tubal ligation     Family History  Problem Relation Age of Onset  . Stroke Father   . Diabetes Brother   . Coronary artery disease Brother     History  Substance Use Topics  . Smoking status: Current Everyday Smoker -- 0.5 packs/day    Types: Cigarettes  . Smokeless tobacco: Not on file  . Alcohol Use: No    OB History    Grav Para Term Preterm Abortions TAB SAB Ect Mult Living                  Review of Systems  Constitutional: Negative.   HENT: Negative.   Eyes: Negative.   Respiratory: Negative.   Cardiovascular: Negative.   Gastrointestinal: Negative.   Genitourinary: Negative.   Musculoskeletal: Negative.   Skin: Negative.   Neurological: Negative.   Hematological: Negative.   Psychiatric/Behavioral:       "nerve problem"  All other systems reviewed and are negative.    Allergies  Latex  Home Medications   Current Outpatient Rx  Name Route Sig Dispense Refill  . HALOPERIDOL 2 MG PO TABS Oral Take 2 mg by mouth daily.    Marland Kitchen  LEVOTHYROXINE SODIUM 112 MCG PO TABS Oral Take 1 tablet (112 mcg total) by mouth daily. 30 tablet 1  . LISINOPRIL-HYDROCHLOROTHIAZIDE 20-12.5 MG PO TABS Oral Take 1 tablet by mouth daily. 30 tablet 1    BP 115/64  Pulse 57  Temp(Src) 98.3 F (36.8 C) (Oral)  Resp 24  SpO2 100%  Physical Exam  Nursing note and vitals reviewed. Constitutional: She is oriented to person, place, and time. She appears well-developed and well-nourished. No distress.  HENT:  Head: Normocephalic and atraumatic.  Eyes: Conjunctivae and EOM are normal. Pupils are equal, round, and reactive to light.  Neck: Normal range of motion.  Cardiovascular: Normal rate, regular rhythm, normal heart sounds and intact distal pulses.  Exam reveals no gallop and no friction rub.   No murmur heard. Pulmonary/Chest: Effort normal and breath sounds normal. No respiratory distress. She has no wheezes. She has no rales.  Abdominal: Soft. Bowel sounds are normal. She exhibits no distension. There is no tenderness. There is no rebound and no guarding.  Musculoskeletal: Normal range of motion.  Neurological: She is alert and oriented to person, place, and time. No cranial nerve deficit. She exhibits normal muscle tone. Coordination normal.  Skin: Skin is warm and dry. No rash noted.  Psychiatric:       Anxious denies suicidal or homicidal ideation  ED Course  Procedures (including critical care time)  Labs Reviewed  BASIC METABOLIC PANEL - Abnormal; Notable for the following:    Glucose, Bld 102 (*)    GFR calc non Af Amer 83 (*)    All other components within normal limits  CBC  URINE RAPID DRUG SCREEN (HOSP PERFORMED)  ETHANOL  PREGNANCY, URINE   No results found.   1. Anxiety       MDM  Patient was evaluated. She is hemodynamically stable. Clearance labs were ordered. Asked to consult was placed. Patient had home medications as well as cycling orders placed. She will be evaluated by the act team for further  management.       Cyndra Numbers, MD 05/25/11 [id number]
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
## 2011-05-25 NOTE — H&P (Signed)
I have seen and examined this patient and agree with this evaluation.  

## 2011-05-26 DIAGNOSIS — F2089 Other schizophrenia: Secondary | ICD-10-CM

## 2011-05-26 MED ORDER — HALOPERIDOL 2 MG PO TABS
2.0000 mg | ORAL_TABLET | Freq: Every day | ORAL | Status: DC
  Administered 2011-05-26: 2 mg via ORAL
  Filled 2011-05-26: qty 14
  Filled 2011-05-26 (×2): qty 1

## 2011-05-26 MED ORDER — HALOPERIDOL DECANOATE 100 MG/ML IM SOLN
25.0000 mg | Freq: Once | INTRAMUSCULAR | Status: AC
  Administered 2011-05-26: 25 mg via INTRAMUSCULAR
  Filled 2011-05-26: qty 0.25

## 2011-05-26 NOTE — Progress Notes (Signed)
Patient ID: Jacqueline Berry, female   DOB: [birth year], 51 y.o.   MRN: [id number]

## 2011-05-26 NOTE — Progress Notes (Signed)
Pt has gone to groups but participation is limited. Pt isolates to room and has little interactions with others. Pt rates her depression and hopelessness at a 10. Pt was offered support and encouragement. Pt safety maintained on unit.

## 2011-05-26 NOTE — Progress Notes (Signed)
Recreation Therapy Notes  05/26/2011         Time: 1415      Group Topic/Focus: The focus of this group is on enhancing patients' problem solving skills, which involves identifying the problem, brainstorming solutions and choosing and trying a solution.   Participation Level: Active  Participation Quality: Attentive  Affect: Blunted  Cognitive: Alert   Additional Comments: None.   Lindsay Straka 05/26/2011 3:14 PM

## 2011-05-26 NOTE — Progress Notes (Signed)
Patient seen during during d/c planning group and or treatment team.  She reports admitting to the hospital due to having thoughts that staff of [employer] is following her and that co-working are messing with her machine at work.   She reports having these thoughts for the past eight to ten months.  She reports the thoughts started when she was getting harassing phone calls from creditors.  She shared during treatment team that after attending groups today she believes that thoughts are not real.  She shared that she is willing to go back on Haldol injections.  Patient plans to return to the home she shares with her husband. She denies SI/HI and states that she does not believe her life is in jeopardy.

## 2011-05-26 NOTE — Progress Notes (Signed)
[facility] Group Notes:  (Counselor/Nursing/MHT/Case Management/Adjunct)  05/26/2011 3:38 PM  Type of Therapy:  1:15PM Group Therapy  Participation Level:  Minimal  Participation Quality:  Appropriate and Attentive  Affect:  Appropriate  Cognitive:  Alert and Appropriate  Insight:  Good  Engagement in Group:  Limited  Engagement in Therapy:  Limited  Modes of Intervention:  Education and Exploration  Summary of Progress/Problems: Patient stated she does not talk about her situation to others because she is afraid of feeling embarrassed. She reported since her husband cooks dinner every Sunday, that leaves her with the repsonsiblity of washing the dishes and that is calming and relaxing for her.    Wilmon Arms 05/26/2011, 3:38 PM  Cosigned by: Angus Palms, LCSW

## 2011-05-26 NOTE — Progress Notes (Signed)
Patient ID: Jacqueline Berry, female   DOB: [birth year], 51 y.o.   MRN: [id number] Pt stated that she's been paranoid for apprx 8 months. Last Oct pt lost her job due to paranoia, however has since started back working at another company. Pt continues to believe that she's being followed. When asked if she believes that she truly being followed pt stated, "Yes I know I'm being followed because things are happening off street and at work". Pt refused trazodone, "Doesn't need help with sleep tonight". Support and encouragement was offered.

## 2011-05-26 NOTE — Progress Notes (Signed)
Patient ID: Jacqueline Berry, female   DOB: [birth year], 51 y.o.   MRN: [id number] Pt seen in treatment team.  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  She is able to see that she had done better on the Haldol shots.  Her insurance requires that she buy the multidose bottle up front and then she takes the bottle to [facility] and then they give her the injection every two weeks.  She reports that her mood and anxiety are improved with getting back on her medicines.  She is agreeable with restarting the Haldol shots.  She has had some financial troubles and has asked everybody that she knows for help and she can borrow no more money.  She did acknowledge that she can cut back on some of her expenses so that she can afford the shots again. She denies any SI/HI today.

## 2011-05-26 NOTE — Progress Notes (Signed)
[facility] Group Notes:  (Counselor/Nursing/MHT/Case Management/Adjunct)  05/26/2011 3:19 PM  Type of Therapy:  Group Therapy  Participation Level:  None  Participation Quality:  Appropriate and Attentive  Affect:  Appropriate  Cognitive:  Alert and Appropriate  Insight:  None  Engagement in Group:  None  Engagement in Therapy:  None  Modes of Intervention:  Education and Exploration  Summary of Progress/Problems: Patient seemed to be receptive to thoughts and ideas of peers. However, patient did not verbally participate in group discussion.   Wilmon Arms 05/26/2011, 3:19 PM  Cosigned by: Angus Palms, LCSW

## 2011-05-26 NOTE — Tx Team (Signed)
Interdisciplinary Treatment Plan Update (Adult)  Date:  05/26/2011  Time Reviewed:  10:18 AM   Progress in Treatment: Attending groups: Yes Participating in groups:  Yes, but anxious and vague about talking in groups Taking medication as prescribed: No, refused Haldol this morning, reports she only takes it at night, not twice daily Tolerating medication: Yes, those she is taking Family/Significant othe contact made:  No, consent given to contact husband, Timothy Patient understands diagnosis: Yes Discussing patient identified problems/goals with staff:  Yes Medical problems stabilized or resolved: Yes Denies suicidal/homicidal ideation: Yes Issues/concerns per patient self-inventory:  No  Other:  New problem(s) identified: None  Reason for Continuation of Hospitalization: Delusions  Medication stabilization  Interventions implemented related to continuation of hospitalization:  Medication stabilization, safety checks q 15 mins, group attendance  Additional comments: Does not want to return to [place], but has insurance that may not cover ACTT  Estimated length of stay:  3-5 days  Discharge Plan: Discharge home, wants to have an ACT Team follow up with her   New goal(s):  Review of initial/current patient goals per problem list:   1.  Goal(s): Decrease depressive symptoms  Met:  No  Target date: by dsicharge  As evidenced by: Shevelle will rate depression at 4 or less, currently rates at 10  2.  Goal (s): Reduce paranoia   Met:  No  Target date: by discharge  As evidenced by: Reduce to baseline as reported by Varnell  3.  Goal(s): Medication stabilization         Met:  No  Target date: by discharge  As evidenced by: [name] will take medications as prescribed and report no intolerable side effects, currently refusing 2nd dose of Haldol  Attendees: Patient:   05/26/2011 10:18 AM  Family:   05/26/2011 10:18 AM  Physician:  Dr Orson Aloe, MD 05/26/2011 10:18 AM    Nursing:   Omelia Blackwater, RN 05/26/2011 10:18 AM  Case Manager:  Juline Patch, LCSW 05/26/2011 10:18 AM  Counselor:  Angus Palms, LCSW 05/26/2011 10:18 AM  Other:  Reyes Ivan, LCSWA 05/26/2011 10:18 AM  Other:  Wilmon Arms, counseling intern 05/26/2011 10:18 AM  Other:  Charlyne Mom, doctoral psych intern 05/26/2011 10:18 AM  Other:   05/26/2011 10:18 AM   Scribe for Treatment Team:   Billie Lade, 05/26/2011 10:18 AM

## 2011-05-27 DIAGNOSIS — F200 Paranoid schizophrenia: Principal | ICD-10-CM

## 2011-05-27 MED ORDER — HALOPERIDOL 2 MG PO TABS: 2.0000 mg | ORAL_TABLET | Freq: Every day | ORAL | Status: DC

## 2011-05-27 MED ORDER — LISINOPRIL 20 MG PO TABS: 20.0000 mg | ORAL_TABLET | Freq: Every day | ORAL | Status: DC

## 2011-05-27 MED ORDER — LISINOPRIL-HYDROCHLOROTHIAZIDE 20-12.5 MG PO TABS: 1.0000 | ORAL_TABLET | Freq: Every day | ORAL | Status: DC

## 2011-05-27 MED ORDER — HYDROCHLOROTHIAZIDE 12.5 MG PO CAPS: 12.5000 mg | ORAL_CAPSULE | Freq: Every day | ORAL | Status: DC

## 2011-05-27 MED ORDER — LEVOTHYROXINE SODIUM 112 MCG PO TABS: 112.0000 ug | ORAL_TABLET | Freq: Every day | ORAL | Status: DC

## 2011-05-27 MED ORDER — TRAZODONE HCL 100 MG PO TABS: 100.0000 mg | ORAL_TABLET | Freq: Every day | ORAL | Status: DC

## 2011-05-27 MED ORDER — HALOPERIDOL DECANOATE 50 MG/ML IM SOLN: INTRAMUSCULAR | Status: DC

## 2011-05-27 NOTE — Progress Notes (Signed)
Patient ID: [name], female   DOB: December 28, 1960, 51 y.o.   MRN: [id number] Patient was pleasant and cooperative during the assessment. Pt engaged in conversation better today. Stated that yesterday she'd been given too much meds and did not remember speaking to the Clinical research associate. Pt completely denies paranoia today, "I don't know if it's the medicine or what". Support and encouragement was offered.

## 2011-05-27 NOTE — Tx Team (Signed)
Interdisciplinary Treatment Plan Update (Adult)  Date:  05/27/2011  Time Reviewed:  10:15 AM   Progress in Treatment: Attending groups:   Yes   Participating in groups:  Yes Taking medication as prescribed:  Yes Tolerating medication:  Yes Family/Significant othe contact made:  Patient understands diagnosis:  Yes Discussing patient identified problems/goals with staff: Yes Medical problems stabilized or resolved: Yes Denies suicidal/homicidal ideation:Yes Issues/concerns per patient self-inventory:  Other:  New problem(s) identified:  Reason for Continuation of Hospitalization:  Interventions implemented related to continuation of hospitalization:  Additional comments:  Patient has IPRS funding through [facility] to obtain medications  Estimated length of stay: Discharge home today  Discharge Plan:  Home with outpatient follow up at [facility]):  Review of initial/current patient goals per problem list:    1.  Goal(s): Decrease depressive symptoms  Met:  No  Target date:  d/c  As evidenced by:  Shaterica rates depression at zero dated; rated at ten on admission  2.  Goal (s):  Reduce/eliminate paranoia  Met:  No  Target date:d/c  As evidenced by:  [name] reports being at baseline - denies paranoia today  3.  Goal(s):   Stabilize on medications  Met:  No  Target date: d/c  As evidenced by:                                           4.  Goal(s): Schedule outpatient follow up  Met:  Yes  Target date: d/c  As evidenced by:  Follow up scheduled with [facility]  Attendees: Patient:  Jacqueline Berry 2/7/201310:40 AM   Family:     Physician:  05/27/2011 10:15 AM   Nursing:    05/27/2011 10:15 AM   CaseManager:  Juline Patch, LCSW 05/27/2011 10:15 AM   Counselor:  Angus Palms, LCSW 05/27/2011 10:15 AM   Other:  Consuello Bossier, NP 05/27/2011 10:15 AM   Other:  Reyes Ivan, LCSWA 05/27/2011  10:15 AM   Other:     Other:      Scribe for Treatment Team:     Wynn Banker, LCSW,  05/27/2011 10:15 AM

## 2011-05-27 NOTE — Progress Notes (Addendum)
[facility] Case Management Discharge Plan:  Will you be returning to the same living situation after discharge: Yes,  Jacqueline Berry will return to the home she shares with her husband At discharge, do you have transportation home?:  Jacqueline Berry reports she drove herself to the hospital Do you have the ability to pay for your medications:  Jacqueline Berry will need assistance with indigent medications  Interagency Information:     Release of information consent forms completed and in the chart;  Patient's signature needed at discharge.  Patient to Follow up at:  Jacqueline Berry at [facility]  Tuesday, June 01, 2011 @ 10:15   Patient denies SI/HI:   Yes,  Jacqueline Berry is not endorsing SI    Safety Planning and Suicide Prevention discussed:  No.  Jacqueline Berry was not endorsing SI at time of admission  Barrier to discharge identified:No.  Summary and Recommendations:  Jacqueline Berry reports she is okay to returning to [place] and states she understand her thoughts were not real.  IPRS funding available at [facility] for patient to obtain Haldol Dec Injection per [employer] at [place].  Wynn Banker 05/27/2011, 10:34 AM

## 2011-05-27 NOTE — Progress Notes (Signed)
[facility] Group Notes:  (Counselor/Nursing/MHT/Case Management/Adjunct)  05/27/2011 1:05 PM  Type of Therapy:  Group Therapy  Participation Level:  None  Participation Quality:  Attentive  Affect:  Appropriate  Cognitive:  Alert and Appropriate  Insight:  None  Engagement in Group:  None  Engagement in Therapy:  None  Modes of Intervention:  Education and Exploration  Summary of Progress/Problems: Patient did not verbally participate in group discussion. However, she seemed to relate and agree with the comments and thoughts of her peers as she would often not in agreement.   Wilmon Arms 05/27/2011, 1:05 PM  Cosigned by: Angus Palms, LCSW

## 2011-05-27 NOTE — Discharge Summary (Signed)
Physician Discharge Summary Note  Patient:  Jacqueline Berry is an 51 y.o., female MRN:  [id number] DOB:  10-10-1960 Patient phone:  [phone] (home)  Patient address:   [address] [place] [address],   Date of Admission:  05/25/2011 Date of Discharge: 05/27/11  Reason for Admission: Paranoia, anxiety   Discharge Diagnoses: Principal Problem:  *Schizophrenia, paranoid type   Axis Diagnosis:   AXIS I:  Chronic Paranoid Schizophrenia AXIS II:  Deferred AXIS III:   Past Medical History  Diagnosis Date  . Bipolar disorder   . Breast cancer     with chemo - 1997  . Hypertension   . Thyroid disease    AXIS IV:  economic problems, occupational problems and problems with access to health care services AXIS V:  70  Level of Care:  OP  Hospital Course:  Jacqueline Berry is a 51 year old African-American female. Admitted to [facility] from the [facility] ED with complaints of anxiety and depression. Patient reports, "I have been stressed out and depressed. I feel like people are following me. I feel like some people are out to get me. I would like to think that [facility] people are after me. They are mad at me because I did not take the medicines they gave.  While a patient in this hospital, patient received medication management as well as group counseling. She also received continued care and monitoring for her other medical problems. She reports at the team meeting this am that she is feeling better and would like to be discharged to her home. She agreed to resume follow-up care at [facility] after discharge. She blamed the feelings she had towards [facility] as part of her paranoia. To assist patient in compliance with medication regimen, she was switched to haloperidol 25 mg IM. She received her first dose on 05/26/11. She will receive another IM dose of haldol on around 06/24/11. The address, time and date of her follow-up appointment provided for patient. Patient is provided with 2 weeks supply  samples of her discharged medications.  She left [facility] facility in no apparent distress via family transport.   Consults:  None  Significant Diagnostic Studies:  None  Discharge Vitals:   Blood pressure 195/108, pulse 65, temperature 97.3 F (36.3 C), temperature source Oral, resp. rate 16, height 5\' 1"  (1.549 m), weight 91.627 kg (202 lb), last menstrual period 02/18/2011, SpO2 99.00%.  Mental Status Exam: See Mental Status Examination and Suicide Risk Assessment completed by Attending Physician prior to discharge.  Discharge destination:  Home  Is patient on multiple antipsychotic therapies at discharge:  No   Has Patient had three or more failed trials of antipsychotic monotherapy by history:  No  Recommended Plan for Multiple Antipsychotic Therapies: NA   Medication List  As of 05/27/2011  1:54 PM   START taking these medications         traZODone 100 MG tablet   Commonly known as: DESYREL   Take 1 tablet (100 mg total) by mouth at bedtime. For sleep/depression         CHANGE how you take these medications         haloperidol 2 MG tablet   Commonly known as: HALDOL   Take 1 tablet (2 mg total) by mouth daily. For control of mood   What changed: doctor's instructions      levothyroxine 112 MCG tablet   Commonly known as: SYNTHROID, LEVOTHROID   Take 1 tablet (112 mcg total) by  mouth daily. Thyroid hormone replacement   What changed: doctor's instructions         CONTINUE taking these medications         lisinopril-hydrochlorothiazide 20-12.5 MG per tablet   Commonly known as: PRINZIDE,ZESTORETIC   Take 1 tablet by mouth daily.          Where to get your medications    These are the prescriptions that you need to pick up.   You may get these medications from any pharmacy.         haloperidol 2 MG tablet   traZODone 100 MG tablet         Information on where to get these meds is not yet available. Ask your nurse or doctor.         levothyroxine 112 MCG  tablet   lisinopril-hydrochlorothiazide 20-12.5 MG per tablet           Follow-up Information    Follow up with Clerance Lav - [facility] on 06/01/2011. (Your appointmetn with Clerance Lav is scheduled for Tuesday, June 01, 2011 at 10:15 a.m.)          Follow-up recommendations:  Other:  Keep all follow-up appointments as recommended.  Comments:  Take all medications as prescribed.                       Report any adverse effects to your provider promptly.  SignedArmandina Stammer I 05/27/2011, 1:54 PM

## 2011-05-27 NOTE — [facility] Suicide Risk Assessment (Signed)
Suicide Risk Assessment  Discharge Assessment     Demographic factors:  Assessment Details Time of Assessment: Admission Information Obtained From: Patient Current Mental Status:  Current Mental Status:  (denied SI & HI, contracts for safety) Risk Reduction Factors:  Risk Reduction Factors: Sense of responsibility to family;Religious beliefs about death;Employed;Living with another person, especially a relative;Positive social support  CLINICAL FACTORS:   Severe Anxiety and/or Agitation Schizophrenia:   Paranoid or undifferentiated type Previous Psychiatric Diagnoses and Treatments Medical Diagnoses and Treatments/Surgeries  COGNITIVE FEATURES THAT CONTRIBUTE TO RISK:  Thought constriction (tunnel vision)    SUICIDE RISK:   Minimal: No identifiable suicidal ideation.  Patients presenting with no risk factors but with morbid ruminations; may be classified as minimal risk based on the severity of the depressive symptoms  ADL's:  Intact  Sleep: Good  Appetite:  Good  Suicidal Ideation:  Denies adamantly any suicidal thoughts. Homicidal Ideation:  Denies adamantly any homicidal thoughts.  Mental Status Examination/Evaluation: Objective:  Appearance: Casual  Eye Contact::  Good  Speech:  Clear and Coherent  Volume:  Normal  Mood:  Euthymic  Affect:  Congruent  Thought Process:  Coherent  Orientation:  Full  Thought Content:  WDL  Suicidal Thoughts:  No  Homicidal Thoughts:  No  Memory:  Immediate;   Good  Judgement:  Good  Insight:  Good  Psychomotor Activity:  Normal  Concentration:  Good  Recall:  Good  Akathisia:  No  AIMS (if indicated):     Assets:  Communication Skills Desire for Improvement Financial Resources/Insurance Housing Intimacy Physical Health Transportation Vocational/Educational  Sleep:  Number of Hours: 6.25    Vital Signs: Blood pressure 195/108, pulse 65, temperature 97.3 F (36.3 C), temperature source Oral, resp. rate 16, height 5\' 1"   (1.549 m), weight 91.627 kg (202 lb), last menstrual period 02/18/2011, SpO2 99.00%. Current Medications:     . haloperidol  2 mg Oral Daily  . hydrochlorothiazide  12.5 mg Oral Daily  . levothyroxine  112 mcg Oral Daily  . lisinopril  20 mg Oral Daily  . nicotine  21 mg Transdermal Q0600  . traZODone  100 mg Oral QHS   Labs Results for orders placed during the hospital encounter of 05/25/11 (from the past 48 hour(s))  URINALYSIS, ROUTINE W REFLEX MICROSCOPIC     Status: Abnormal   Collection Time   05/25/11  6:40 PM      Component Value Range Comment   Color, Urine YELLOW  YELLOW     APPearance CLOUDY (*) CLEAR     Specific Gravity, Urine 1.026  1.005 - 1.030     pH 6.0  5.0 - 8.0     Glucose, UA NEGATIVE  NEGATIVE (mg/dL)    Hgb urine dipstick NEGATIVE  NEGATIVE     Bilirubin Urine NEGATIVE  NEGATIVE     Ketones, ur NEGATIVE  NEGATIVE (mg/dL)    Protein, ur NEGATIVE  NEGATIVE (mg/dL)    Urobilinogen, UA 1.0  0.0 - 1.0 (mg/dL)    Nitrite NEGATIVE  NEGATIVE     Leukocytes, UA NEGATIVE  NEGATIVE  MICROSCOPIC NOT DONE ON URINES WITH NEGATIVE PROTEIN, BLOOD, LEUKOCYTES, NITRITE, OR GLUCOSE <1000 mg/dL.    What pt has learned from hospital stay is that she needs to stay on her medications and go to her appointments and talk to her therapist about her paranoia.  Risk of self harm is slightly elevated by her diagnosis of paranoid schizophrenia, but she has her life and her  family to live for.  She is struggling with a decision to go back to school   Risk of harm to others is minimal in that she has not been involved in fights or had any legal charges filed on her.  PLAN: Discharge home Continue Medication List  As of 05/27/2011  2:18 PM   TAKE these medications         haloperidol 2 MG tablet   Commonly known as: HALDOL   Take 1 tablet (2 mg total) by mouth daily. For control of mood      levothyroxine 112 MCG tablet   Commonly known as: SYNTHROID, LEVOTHROID   Take 1 tablet  (112 mcg total) by mouth daily. Thyroid hormone replacement      lisinopril-hydrochlorothiazide 20-12.5 MG per tablet   Commonly known as: PRINZIDE,ZESTORETIC   Take 1 tablet by mouth daily.      traZODone 100 MG tablet   Commonly known as: DESYREL   Take 1 tablet (100 mg total) by mouth at bedtime. For sleep/depression           Jacqueline Berry 05/27/2011, 2:16 PM

## 2011-05-27 NOTE — Progress Notes (Signed)
[facility] Group Notes:  (Counselor/Nursing/MHT/Case Management/Adjunct)  05/27/2011 2:12 PM  Type of Therapy:  1:15PM Group Therapy  Participation Level:  Did Not Attend  Wilmon Arms 05/27/2011, 2:12 PM  Cosigned by: Angus Palms, LCSW

## 2011-05-27 NOTE — Progress Notes (Signed)
Discharge Note:   Patient stated she received all her belongings, black toboggan, black jacket, black purse, phone, miscellaneous items, clothing.  Patient received suicide prevention information which was discussed with patient, stated she understood and had no questions.  Denied SI and HI.   Denied A/V hallucinations.   Denied pain.  Stated she appreciated all the staff has done to help her while at [facility].

## 2011-05-27 NOTE — Progress Notes (Signed)
Patient stated she is ready to go home. On self inventory sheet, sleeps well, has good appetite, normal energy level, good attention span.  Rated depression #2, hopelessness #1.  Denied SI.  Plans to take her medicine and go to the doctor.  Wants to go home and go more after discharge.   Does have discharge plans.   No problems taking meds after discharge.

## 2011-05-31 NOTE — Progress Notes (Signed)
Patient Discharge Instructions:  Admission Note Faxed,  05/29/2011 After Visit Summary Faxed,  05/29/2011 Faxed to the Next Level Care provider:  05/29/2011 D/C Summary Note faxed 05/29/2011 Facesheet faxed 05/29/2011   Faxed to [facility] @ [phone]  Wandra Scot, 05/31/2011, 11:04 AM

## 2011-06-21 ENCOUNTER — Encounter: Payer: Self-pay | Admitting: *Deleted

## 2011-06-28 ENCOUNTER — Other Ambulatory Visit: Payer: Self-pay | Admitting: Obstetrics and Gynecology

## 2011-06-28 DIAGNOSIS — Z1231 Encounter for screening mammogram for malignant neoplasm of breast: Secondary | ICD-10-CM

## 2011-06-29 ENCOUNTER — Ambulatory Visit (HOSPITAL_COMMUNITY): Payer: PRIVATE HEALTH INSURANCE | Attending: Obstetrics and Gynecology

## 2011-06-29 ENCOUNTER — Ambulatory Visit: Payer: Self-pay

## 2011-08-29 ENCOUNTER — Encounter (HOSPITAL_COMMUNITY): Payer: Self-pay | Admitting: Emergency Medicine

## 2011-08-29 ENCOUNTER — Emergency Department (HOSPITAL_COMMUNITY)
Admission: EM | Admit: 2011-08-29 | Discharge: 2011-08-29 | Disposition: A | Discharge status: Home / Self Care | Payer: No Typology Code available for payment source | Attending: Emergency Medicine | Admitting: Emergency Medicine

## 2011-08-29 DIAGNOSIS — F319 Bipolar disorder, unspecified: Secondary | ICD-10-CM | POA: Insufficient documentation

## 2011-08-29 DIAGNOSIS — I10 Essential (primary) hypertension: Secondary | ICD-10-CM | POA: Insufficient documentation

## 2011-08-29 DIAGNOSIS — F17200 Nicotine dependence, unspecified, uncomplicated: Secondary | ICD-10-CM | POA: Insufficient documentation

## 2011-08-29 DIAGNOSIS — Z853 Personal history of malignant neoplasm of breast: Secondary | ICD-10-CM | POA: Insufficient documentation

## 2011-08-29 DIAGNOSIS — Z760 Encounter for issue of repeat prescription: Secondary | ICD-10-CM | POA: Insufficient documentation

## 2011-08-29 DIAGNOSIS — E079 Disorder of thyroid, unspecified: Secondary | ICD-10-CM | POA: Insufficient documentation

## 2011-08-29 DIAGNOSIS — Z79899 Other long term (current) drug therapy: Secondary | ICD-10-CM | POA: Insufficient documentation

## 2011-08-29 MED ORDER — LISINOPRIL-HYDROCHLOROTHIAZIDE 20-12.5 MG PO TABS: 1.0000 | ORAL_TABLET | Freq: Every day | ORAL | Status: DC

## 2011-08-29 MED ORDER — LEVOTHYROXINE SODIUM 112 MCG PO TABS: 112.0000 ug | ORAL_TABLET | Freq: Every day | ORAL | Status: DC

## 2011-08-29 NOTE — ED Provider Notes (Signed)
History     CSN: [id number]  Arrival date & time 08/29/11  1478   First MD Initiated Contact with Patient 08/29/11 1950      Chief Complaint  Patient presents with  . Medication Refill    (Consider location/radiation/quality/duration/timing/severity/associated sxs/prior treatment) HPI Patient presents to emergency department requesting refill for medications. Patient states that she missed her appointment with her primary care provider and therefore states that they would not refill her medications and that she cannot be seen until next month. Patient is requesting refill for Synthroid and her blood pressure medication, lisinopril-hydrochlorothiazide. Patient has no physical complaint. Patient states she last missed her appointment and therefore was told that due to noncompliance that they would not fill medication until she is seen again however it would not be until next month. Denies aggravating or alleviating factors. Past Medical History  Diagnosis Date  . Bipolar disorder   . Breast cancer     with chemo - 1997  . Hypertension   . Thyroid disease     Past Surgical History  Procedure Date  . Mastectomy     left breat mastectomy 1997  . Tubal ligation     1992    Family History  Problem Relation Age of Onset  . Stroke Father   . Diabetes Brother   . Coronary artery disease Brother     History  Substance Use Topics  . Smoking status: Current Everyday Smoker -- 0.5 packs/day for 37 years    Types: Cigarettes  . Smokeless tobacco: Not on file  . Alcohol Use: No    OB History    Grav Para Term Preterm Abortions TAB SAB Ect Mult Living                  Review of Systems  All other systems reviewed and are negative.    Allergies  Latex  Home Medications   Current Outpatient Rx  Name Route Sig Dispense Refill  . LEVOTHYROXINE SODIUM 112 MCG PO TABS Oral Take 1 tablet (112 mcg total) by mouth daily. Thyroid hormone replacement 30 tablet 0  .  LISINOPRIL-HYDROCHLOROTHIAZIDE 20-12.5 MG PO TABS Oral Take 1 tablet by mouth daily. 30 tablet 1  . HALOPERIDOL 2 MG PO TABS Oral Take 1 tablet (2 mg total) by mouth daily. For control of mood 60 tablet 0  . LEVOTHYROXINE SODIUM 112 MCG PO TABS Oral Take 1 tablet (112 mcg total) by mouth daily. 30 tablet 0  . LISINOPRIL-HYDROCHLOROTHIAZIDE 20-12.5 MG PO TABS Oral Take 1 tablet by mouth daily. 30 tablet 0  . TRAZODONE HCL 100 MG PO TABS Oral Take 1 tablet (100 mg total) by mouth at bedtime. For sleep/depression 30 tablet 0    BP 157/96  Pulse 60  Temp 98 F (36.7 C)  Resp 16  Wt 202 lb (91.627 kg)  SpO2 99%  Physical Exam  Nursing note and vitals reviewed. Constitutional: She is oriented to person, place, and time. She appears well-developed and well-nourished. No distress.  HENT:  Head: Normocephalic and atraumatic.  Eyes: Conjunctivae are normal.  Neck: Normal range of motion. Neck supple. No thyromegaly present.  Cardiovascular: Normal rate.   Pulmonary/Chest: Effort normal.  Musculoskeletal: Normal range of motion.  Neurological: She is alert and oriented to person, place, and time.  Skin: Skin is warm and dry. No rash noted. She is not diaphoretic.    ED Course  Procedures (including critical care time)  Labs Reviewed - No data to display No  results found.   1. Medication refill       MDM  Patient with no complaints other than requesting refill medication. VS are normal. Spoke at length with patient about the inappropriate use of the emergency department for a medication refill but that we would do her courtesy by refilling her medication. She would need to follow close with her primary care provider for future medication management and that it is important for her to keep her appointments. Patient was turned are seen is agreeable plan.        Jenness Corner, [place] 08/29/11 2350

## 2011-08-29 NOTE — Discharge Instructions (Signed)
Medication Refill, Emergency Department  We have refilled your medication today as a courtesy to you. It is best for your medical care, however, to take care of getting refills done through your primary caregiver's office. They have your records and can do a better job of follow-up than we can in the emergency department.  On maintenance medications, we often only prescribe enough medications to get you by until you are able to see your regular caregiver. This is a more expensive way to refill medications.  In the future, please plan for refills so that you will not have to use the emergency department for this.  Thank you for your help. Your help allows us to better take care of the daily emergencies that enter our department.  Document Released: 07/23/2003 Document Revised: 03/25/2011 Document Reviewed: 04/05/2005  [employer] Patient Information 2012 [employer].

## 2011-08-29 NOTE — ED Provider Notes (Signed)
Medical screening examination/treatment/procedure(s) were performed by non-physician practitioner and as supervising physician I was immediately available for consultation/collaboration. Devoria Albe, MD, Armando Gang   Ward Givens, MD 08/29/11 2351

## 2011-08-29 NOTE — ED Notes (Signed)
Pt alert, nad, c/o needing medication refill, states missed apt, prescriptions ran out, Hypertention, thyroid

## 2011-10-05 ENCOUNTER — Encounter (HOSPITAL_COMMUNITY): Payer: Self-pay | Admitting: Emergency Medicine

## 2011-10-05 ENCOUNTER — Emergency Department (HOSPITAL_COMMUNITY)
Admission: EM | Admit: 2011-10-05 | Discharge: 2011-10-05 | Disposition: A | Discharge status: Home / Self Care | Payer: Self-pay | Attending: Emergency Medicine | Admitting: Emergency Medicine

## 2011-10-05 DIAGNOSIS — Z760 Encounter for issue of repeat prescription: Secondary | ICD-10-CM | POA: Insufficient documentation

## 2011-10-05 DIAGNOSIS — Z79899 Other long term (current) drug therapy: Secondary | ICD-10-CM | POA: Insufficient documentation

## 2011-10-05 DIAGNOSIS — F17200 Nicotine dependence, unspecified, uncomplicated: Secondary | ICD-10-CM | POA: Insufficient documentation

## 2011-10-05 DIAGNOSIS — E079 Disorder of thyroid, unspecified: Secondary | ICD-10-CM | POA: Insufficient documentation

## 2011-10-05 DIAGNOSIS — I10 Essential (primary) hypertension: Secondary | ICD-10-CM | POA: Insufficient documentation

## 2011-10-05 DIAGNOSIS — Z9221 Personal history of antineoplastic chemotherapy: Secondary | ICD-10-CM | POA: Insufficient documentation

## 2011-10-05 DIAGNOSIS — Z853 Personal history of malignant neoplasm of breast: Secondary | ICD-10-CM | POA: Insufficient documentation

## 2011-10-05 MED ORDER — LISINOPRIL-HYDROCHLOROTHIAZIDE 20-12.5 MG PO TABS: 1.0000 | ORAL_TABLET | Freq: Every day | ORAL | Status: DC

## 2011-10-05 MED ORDER — LEVOTHYROXINE SODIUM 50 MCG PO TABS: 112.0000 ug | ORAL_TABLET | Freq: Every day | ORAL | Status: DC

## 2011-10-05 NOTE — Discharge Instructions (Signed)
Ms Pae get the orange card application in progress and choose a physician from the list below.  Get a thyroid workup when you get a physician.  The dosage of your thyroid medication needs to be monitored.  Return to the ER for any concerns. Medication Refill, Emergency Department We have refilled your medication today as a courtesy to you. It is best for your medical care, however, to take care of getting refills done through your primary caregiver's office. They have your records and can do a better job of follow-up than we can in the emergency department. On maintenance medications, we often only prescribe enough medications to get you by until you are able to see your regular caregiver. This is a more expensive way to refill medications. In the future, please plan for refills so that you will not have to use the emergency department for this. Thank you for your help. Your help allows [place] to better take care of the daily emergencies that enter our department. Document Released: 07/23/2003 Document Revised: 03/25/2011 Document Reviewed: 04/05/2005 [facility] Patient Information 2012 [place], [place]

## 2011-10-05 NOTE — Progress Notes (Signed)
Pt listed as self pay with no insurance coverage Pt confirms she is self pay [place] resident CM and [facility] community liaison spoke with her Pt offered [facility] services to assist with finding a [place] self pay provider Pt accepted information   

## 2011-10-05 NOTE — ED Notes (Signed)
Pt given d/c instructions and verbalized understanding.  

## 2011-10-05 NOTE — ED Notes (Signed)
Pt reports being out of BP and Thyroid meds x 1 day and needs refills. Pt has no other complaints.

## 2011-10-09 NOTE — ED Provider Notes (Signed)
History     CSN: [id number]  Arrival date & time 10/05/11  8657   First MD Initiated Contact with Patient 10/05/11 1015      No chief complaint on file.   (Consider location/radiation/quality/duration/timing/severity/associated sxs/prior treatment) The history is provided by the patient. No language interpreter was used.   Cc: medication refill for lisinopril and synthroid.   Past Medical History  Diagnosis Date  . Bipolar disorder   . Breast cancer     with chemo - 1997  . Hypertension   . Thyroid disease     Past Surgical History  Procedure Date  . Mastectomy     left breat mastectomy 1997  . Tubal ligation     1992    Family History  Problem Relation Age of Onset  . Stroke Father   . Diabetes Brother   . Coronary artery disease Brother     History  Substance Use Topics  . Smoking status: Current Everyday Smoker -- 0.5 packs/day for 37 years    Types: Cigarettes  . Smokeless tobacco: Not on file  . Alcohol Use: No    OB History    Grav Para Term Preterm Abortions TAB SAB Ect Mult Living                  Review of Systems  Constitutional: Negative.   HENT: Negative.   Eyes: Negative.   Respiratory: Negative.   Cardiovascular: Negative.   Gastrointestinal: Negative.   Neurological: Negative.   Psychiatric/Behavioral: Negative.   All other systems reviewed and are negative.    Allergies  Latex  Home Medications   Current Outpatient Rx  Name Route Sig Dispense Refill  . LEVOTHYROXINE SODIUM 112 MCG PO TABS Oral Take 1 tablet (112 mcg total) by mouth daily. Thyroid hormone replacement 30 tablet 0  . LISINOPRIL-HYDROCHLOROTHIAZIDE 20-12.5 MG PO TABS Oral Take 1 tablet by mouth daily. 30 tablet 1  . LEVOTHYROXINE SODIUM 50 MCG PO TABS Oral Take 2 tablets (100 mcg total) by mouth daily. 30 tablet 0  . LISINOPRIL-HYDROCHLOROTHIAZIDE 20-12.5 MG PO TABS Oral Take 1 tablet by mouth daily. 30 tablet 0    BP 155/90  Pulse 84  Temp 98.4 F (36.9  C) (Oral)  Resp 20  SpO2 97%  Physical Exam  Nursing note and vitals reviewed. Constitutional: She is oriented to person, place, and time. She appears well-developed and well-nourished.  HENT:  Head: Normocephalic and atraumatic.  Eyes: Conjunctivae and EOM are normal. Pupils are equal, round, and reactive to light.  Neck: Normal range of motion. Neck supple.  Cardiovascular: Normal rate.   Pulmonary/Chest: Effort normal.  Abdominal: Soft.  Musculoskeletal: Normal range of motion. She exhibits no edema and no tenderness.  Neurological: She is alert and oriented to person, place, and time. She has normal reflexes.  Skin: Skin is warm and dry.  Psychiatric: She has a normal mood and affect.    ED Course  Procedures (including critical care time)  Labs Reviewed - No data to display No results found.   1. Medication refill       MDM  Medication refill for synthroid and lisinopril.  Follow up with pcp from list or get one of your own.          Remi Haggard, NP 10/09/11 [phone]

## 2011-10-14 NOTE — ED Provider Notes (Signed)
Medical screening examination/treatment/procedure(s) were performed by non-physician practitioner and as supervising physician I was immediately available for consultation/collaboration.  Ashtyn Meland T Della Homan, MD 10/14/11 2315 

## 2011-10-21 ENCOUNTER — Inpatient Hospital Stay: Payer: Self-pay | Admitting: Psychiatry

## 2011-10-21 LAB — COMPREHENSIVE METABOLIC PANEL
Albumin: 4.4 g/dL (ref 3.4–5.0)
Anion Gap: 9 (ref 7–16)
BUN: 8 mg/dL (ref 7–18)
Calcium, Total: 9.1 mg/dL (ref 8.5–10.1)
Creatinine: 1.1 mg/dL (ref 0.60–1.30)
Glucose: 121 mg/dL — ABNORMAL HIGH (ref 65–99)
Osmolality: 277 (ref 275–301)
Potassium: 3.2 mmol/L — ABNORMAL LOW (ref 3.5–5.1)
SGOT(AST): 22 U/L (ref 15–37)
Sodium: 139 mmol/L (ref 136–145)
Total Protein: 8.8 g/dL — ABNORMAL HIGH (ref 6.4–8.2)

## 2011-10-21 LAB — T4, FREE: Free Thyroxine: 0.69 ng/dL — ABNORMAL LOW (ref 0.76–1.46)

## 2011-10-21 LAB — TSH
Thyroid Stimulating Horm: 39 u[IU]/mL — ABNORMAL HIGH
Thyroid Stimulating Horm: 39.7 u[IU]/mL — ABNORMAL HIGH

## 2011-10-21 LAB — DRUG SCREEN, URINE
Benzodiazepine, Ur Scrn: NEGATIVE (ref ?–200)
Cocaine Metabolite,Ur ~~LOC~~: NEGATIVE (ref ?–300)
MDMA (Ecstasy)Ur Screen: NEGATIVE (ref ?–500)
Opiate, Ur Screen: NEGATIVE (ref ?–300)
Tricyclic, Ur Screen: NEGATIVE (ref ?–1000)

## 2011-10-21 LAB — CBC
HCT: 43.3 % (ref 35.0–47.0)
HGB: 13.8 g/dL (ref 12.0–16.0)
MCH: 27.4 pg (ref 26.0–34.0)
MCHC: 31.8 g/dL — ABNORMAL LOW (ref 32.0–36.0)
RDW: 14.1 % (ref 11.5–14.5)
WBC: 8 10*3/uL (ref 3.6–11.0)

## 2011-10-21 LAB — ETHANOL: Ethanol: <3 mg/dL

## 2011-10-21 LAB — ACETAMINOPHEN LEVEL: Acetaminophen: <2 ug/mL

## 2011-10-22 LAB — LIPID PANEL
Cholesterol: 114 mg/dL (ref 0–200)
Ldl Cholesterol, Calc: 64 mg/dL (ref 0–100)
Triglycerides: 77 mg/dL (ref 0–200)

## 2011-10-26 LAB — COMPREHENSIVE METABOLIC PANEL
Anion Gap: 10 (ref 7–16)
Bilirubin,Total: 0.6 mg/dL (ref 0.2–1.0)
Chloride: 102 mmol/L (ref 98–107)
Co2: 29 mmol/L (ref 21–32)
Creatinine: 0.85 mg/dL (ref 0.60–1.30)
EGFR (African American): >60
EGFR (Non-African Amer.): >60
Osmolality: 279 (ref 275–301)
Sodium: 141 mmol/L (ref 136–145)

## 2011-10-29 LAB — VALPROIC ACID LEVEL: Valproic Acid: 111 ug/mL — ABNORMAL HIGH

## 2011-11-06 ENCOUNTER — Encounter (HOSPITAL_COMMUNITY): Payer: Self-pay | Admitting: *Deleted

## 2011-11-06 ENCOUNTER — Emergency Department (HOSPITAL_COMMUNITY)
Admission: EM | Admit: 2011-11-06 | Discharge: 2011-11-06 | Disposition: A | Discharge status: Home / Self Care | Payer: BC Managed Care – PPO | Attending: Emergency Medicine | Admitting: Emergency Medicine

## 2011-11-06 DIAGNOSIS — F319 Bipolar disorder, unspecified: Secondary | ICD-10-CM | POA: Insufficient documentation

## 2011-11-06 DIAGNOSIS — F22 Delusional disorders: Secondary | ICD-10-CM

## 2011-11-06 DIAGNOSIS — I10 Essential (primary) hypertension: Secondary | ICD-10-CM | POA: Insufficient documentation

## 2011-11-06 DIAGNOSIS — Z008 Encounter for other general examination: Secondary | ICD-10-CM | POA: Insufficient documentation

## 2011-11-06 DIAGNOSIS — F17200 Nicotine dependence, unspecified, uncomplicated: Secondary | ICD-10-CM | POA: Insufficient documentation

## 2011-11-06 DIAGNOSIS — Z853 Personal history of malignant neoplasm of breast: Secondary | ICD-10-CM | POA: Insufficient documentation

## 2011-11-06 DIAGNOSIS — E079 Disorder of thyroid, unspecified: Secondary | ICD-10-CM | POA: Insufficient documentation

## 2011-11-06 LAB — CBC WITH DIFFERENTIAL/PLATELET
Basophils Relative: 0 % (ref 0–1)
Eosinophils Absolute: 0.1 10*3/uL (ref 0.0–0.7)
Eosinophils Relative: 1 % (ref 0–5)
Hemoglobin: 12.9 g/dL (ref 12.0–15.0)
Lymphs Abs: 3.6 10*3/uL (ref 0.7–4.0)
MCH: 28.5 pg (ref 26.0–34.0)
MCHC: 33.1 g/dL (ref 30.0–36.0)
MCV: 86.3 fL (ref 78.0–100.0)
Monocytes Relative: 10 % (ref 3–12)
Platelets: 238 10*3/uL (ref 150–400)
RBC: 4.52 MIL/uL (ref 3.87–5.11)

## 2011-11-06 LAB — BASIC METABOLIC PANEL
BUN: 12 mg/dL (ref 6–23)
Calcium: 9.2 mg/dL (ref 8.4–10.5)
GFR calc non Af Amer: 80 mL/min — ABNORMAL LOW (ref >90)
Glucose, Bld: 139 mg/dL — ABNORMAL HIGH (ref 70–99)

## 2011-11-06 LAB — RAPID URINE DRUG SCREEN, HOSP PERFORMED
Barbiturates: NOT DETECTED
Tetrahydrocannabinol: NOT DETECTED

## 2011-11-06 MED ORDER — NICOTINE 21 MG/24HR TD PT24
21.0000 mg | MEDICATED_PATCH | Freq: Once | TRANSDERMAL | Status: DC
  Administered 2011-11-06: 21 mg via TRANSDERMAL
  Filled 2011-11-06 (×2): qty 1

## 2011-11-06 MED ORDER — LISINOPRIL 20 MG PO TABS
20.0000 mg | ORAL_TABLET | Freq: Every day | ORAL | Status: DC
  Administered 2011-11-06: 20 mg via ORAL
  Filled 2011-11-06: qty 1

## 2011-11-06 MED ORDER — NICOTINE 21 MG/24HR TD PT24
21.0000 mg | MEDICATED_PATCH | Freq: Every day | TRANSDERMAL | Status: DC
  Administered 2011-11-06: 21 mg via TRANSDERMAL

## 2011-11-06 MED ORDER — IBUPROFEN 200 MG PO TABS: 600.0000 mg | ORAL_TABLET | Freq: Three times a day (TID) | ORAL | Status: DC | PRN

## 2011-11-06 MED ORDER — HALOPERIDOL 5 MG PO TABS
5.0000 mg | ORAL_TABLET | Freq: Two times a day (BID) | ORAL | Status: DC
  Administered 2011-11-06: 5 mg via ORAL
  Filled 2011-11-06: qty 1

## 2011-11-06 MED ORDER — LEVOTHYROXINE SODIUM 50 MCG PO TABS
50.0000 ug | ORAL_TABLET | Freq: Every day | ORAL | Status: DC
  Administered 2011-11-06: 50 ug via ORAL
  Filled 2011-11-06 (×2): qty 1

## 2011-11-06 NOTE — ED Notes (Signed)
telepsych in progress 

## 2011-11-06 NOTE — ED Provider Notes (Addendum)
  Physical Exam  BP 157/103  Pulse 65  Temp 98 F (36.7 C) (Oral)  Resp 20  SpO2 100%  Physical Exam  ED Course  Procedures  MDM Patient sleeping comfortably at this time. Reportedly believes that the electronics of monitoring her. Pending ACT evaluation.      [name]. Rubin Payor, MD 11/06/11 [phone]  Per telepsyche should be able to d/c. Pending med recomendations  [name]. Rubin Payor, MD 11/06/11 [phone]

## 2011-11-06 NOTE — ED Notes (Signed)
Pt up to the desk asking if she can leave, Dr. Rubin Payor updated telepsych ordered

## 2011-11-06 NOTE — ED Notes (Signed)
Pt has been off of meds since last year; states when she takes her meds she feels like they can control

## 2011-11-06 NOTE — ED Notes (Signed)
Up to the bathroom 

## 2011-11-06 NOTE — ED Notes (Addendum)
Sitting on bed,  Appears tense-when asked pt reported that she had had a bad dream.  Pt declined medication to help her relax.

## 2011-11-06 NOTE — ED Notes (Signed)
Jacqueline Berry into see

## 2011-11-06 NOTE — ED Notes (Addendum)
Up to the bathroom to shower 

## 2011-11-06 NOTE — ED Notes (Signed)
Greg into see 

## 2011-11-06 NOTE — ED Notes (Signed)
Pt changed into blue scrubs and red socks. Belongs are at the nurses station

## 2011-11-06 NOTE — ED Notes (Signed)
Pt states she has been hearing voices since day of d/c from hospital.  The voices are stating to the pt that somebody is going to hurt her. The voices get worse and worse as the day progress.

## 2011-11-06 NOTE — ED Notes (Signed)
Pt states feels like someone is trying to hurt her; feels like she has been brainwashed.  States she does not want to go home.  States her phone is monitored and they know who she calls.  States she can be seen wherever she is in the house.
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
## 2011-11-06 NOTE — ED Provider Notes (Signed)
Pleasant ., alert , cooperative, .  PLAN RX HALDO 2 MG qhs, TRAZADONE 200 MG QHS, PT TO F/U AT [facility], MD 11/06/11 1849
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
## 2011-11-06 NOTE — ED Notes (Signed)
Pt discharged home. All discharge instructions reviewed with pt by Lizbeth Bark, RN. Pt verbalized understanding of discharge instructions and medications by teach-back instructions. VSS, Pt denied pain. NAD noted.

## 2011-11-06 NOTE — ED Notes (Signed)
Up to to the bathroom 

## 2011-11-06 NOTE — ED Notes (Signed)
Patient aware that EDP wants the telepych to eval.  Pt sitting on bed reports that "I just want to leave...i don't trust anybody here."

## 2011-11-06 NOTE — BH Assessment (Signed)
[facility] Assessment Progress Note  Telepsychiatry had recommended patient return home to f/u with psychiatrist.  This clinician talked to patient and she confirmed that she felt safe going home and that she no longer had any paranoid delusions.  Patient did admit to missing an appointment with [facility].  Patient was encouraged to keep appointment and take medications as prescribed.  She gave permission for clinician to talk to husband, Marcial Pacas.  Clinician called and talked to husband, who said he was fine with her coming home as long as she felt comfortable doing so.  Clinician was told by husband that patient had cancelled the appointment with [facility].  He also said that he has been encouraging her to write things down so that she does not forget.  He is fine with her coming home since she is okay with it.  This clinician talked with her also and she said that she will make appointment again with [facility].  She was encouraged to write down appointments and keep them.  She said that she would do so.  Dr. Ethelda Chick was informed of this also.

## 2011-11-08 ENCOUNTER — Other Ambulatory Visit: Payer: Self-pay | Admitting: Internal Medicine

## 2011-11-08 DIAGNOSIS — Z1231 Encounter for screening mammogram for malignant neoplasm of breast: Secondary | ICD-10-CM

## 2011-11-08 DIAGNOSIS — Z9012 Acquired absence of left breast and nipple: Secondary | ICD-10-CM

## 2011-11-22 ENCOUNTER — Ambulatory Visit: Payer: Self-pay

## 2012-01-12 ENCOUNTER — Ambulatory Visit (HOSPITAL_COMMUNITY)
Admission: RE | Admit: 2012-01-12 | Discharge: 2012-01-12 | Disposition: A | Discharge status: Home / Self Care | Payer: Self-pay | Source: Ambulatory Visit | Attending: Internal Medicine | Admitting: Internal Medicine

## 2012-01-12 DIAGNOSIS — Z1231 Encounter for screening mammogram for malignant neoplasm of breast: Secondary | ICD-10-CM

## 2012-01-12 DIAGNOSIS — Z9012 Acquired absence of left breast and nipple: Secondary | ICD-10-CM

## 2012-06-27 ENCOUNTER — Emergency Department (INDEPENDENT_AMBULATORY_CARE_PROVIDER_SITE_OTHER)
Admission: EM | Admit: 2012-06-27 | Discharge: 2012-06-27 | Disposition: A | Discharge status: Home / Self Care | Payer: Self-pay | Source: Home / Self Care

## 2012-06-27 ENCOUNTER — Encounter (HOSPITAL_COMMUNITY): Payer: Self-pay | Admitting: Emergency Medicine

## 2012-06-27 DIAGNOSIS — R5381 Other malaise: Secondary | ICD-10-CM

## 2012-06-27 DIAGNOSIS — R5383 Other fatigue: Secondary | ICD-10-CM

## 2012-06-27 LAB — CBC WITH DIFFERENTIAL/PLATELET
Basophils Relative: 0 % (ref 0–1)
HCT: 39.4 % (ref 36.0–46.0)
Hemoglobin: 13.4 g/dL (ref 12.0–15.0)
Lymphocytes Relative: 53 % — ABNORMAL HIGH (ref 12–46)
MCHC: 34 g/dL (ref 30.0–36.0)
Monocytes Absolute: 0.3 10*3/uL (ref 0.1–1.0)
Monocytes Relative: 5 % (ref 3–12)
Neutro Abs: 2.5 10*3/uL (ref 1.7–7.7)

## 2012-06-27 LAB — POCT I-STAT, CHEM 8
BUN: 10 mg/dL (ref 6–23)
Calcium, Ion: 1.26 mmol/L — ABNORMAL HIGH (ref 1.12–1.23)
Chloride: 105 mEq/L (ref 96–112)
Glucose, Bld: 121 mg/dL — ABNORMAL HIGH (ref 70–99)

## 2012-06-27 LAB — POCT URINALYSIS DIP (DEVICE)
Bilirubin Urine: NEGATIVE
Ketones, ur: NEGATIVE mg/dL
pH: 6.5 (ref 5.0–8.0)

## 2012-06-27 MED ORDER — POTASSIUM CHLORIDE ER 10 MEQ PO TBCR: 10.0000 meq | EXTENDED_RELEASE_TABLET | Freq: Every day | ORAL | Status: DC

## 2012-06-27 MED ORDER — POTASSIUM CHLORIDE CRYS ER 20 MEQ PO TBCR
EXTENDED_RELEASE_TABLET | ORAL | Status: AC
  Filled 2012-06-27: qty 1

## 2012-06-27 MED ORDER — POTASSIUM CHLORIDE CRYS ER 20 MEQ PO TBCR
40.0000 meq | EXTENDED_RELEASE_TABLET | Freq: Once | ORAL | Status: AC
  Administered 2012-06-27: 40 meq via ORAL

## 2012-06-27 NOTE — ED Notes (Signed)
Reports feeling tired all the time, tired for the last 6 months. Patient reports vague, varied areas of pain.

## 2012-06-27 NOTE — ED Notes (Signed)
Patient Demographics  Jacqueline Berry, is a 52 y.o. female  ZOX:[id number]  WJX:[id number]  DOB - 01-13-1961  Chief Complaint  Patient presents with  . Fatigue        Subjective:   Jacqueline Berry  With H/O hypothyroidism, HTN, Schizophrenia, comes in with 6 mth H/O Gen Fatigue, no headache, no chest-abd pain, no diarrhea, no SOB, no weight loss.  Objective:    Filed Vitals:   06/27/12 1531  BP: 139/85  Pulse: 89  Temp: 98.2 F (36.8 C)  TempSrc: Oral  Resp: 16  SpO2: 97%     Exam  Awake Alert, Oriented X 3, No new F.N deficits, Normal affect, not suicidal-homicidal [place].AT,PERRAL Supple Neck,No JVD, No cervical lymphadenopathy appriciated.  Symmetrical Chest wall movement, Good air movement bilaterally, CTAB RRR,No Gallops,Rubs or new Murmurs, No Parasternal Heave +ve B.Sounds, Abd Soft, Non tender, No organomegaly appriciated, No rebound - guarding or rigidity. No Cyanosis, Clubbing or edema, No new Rash or bruise       Data Review   CBC  Recent Labs Lab 06/27/12 1541  HGB 15.0  HCT 44.0    Chemistries    Recent Labs Lab 06/27/12 1541  NA 143  K 3.0*  CL 105  GLUCOSE 121*  BUN 10  CREATININE 1.10   ------------------------------------------------------------------------------------------------------------------ No results found for this basename: HGBA1C,  in the last 72 hours ------------------------------------------------------------------------------------------------------------------ No results found for this basename: CHOL, HDL, LDLCALC, TRIG, CHOLHDL, LDLDIRECT,  in the last 72 hours ------------------------------------------------------------------------------------------------------------------ No results found for this basename: TSH, T4TOTAL, FREET3, T3FREE, THYROIDAB,  in the last 72 hours ------------------------------------------------------------------------------------------------------------------ No results found for this  basename: VITAMINB12, FOLATE, FERRITIN, TIBC, IRON, RETICCTPCT,  in the last 72 hours  Coagulation profile  No results found for this basename: INR, PROTIME,  in the last 168 hours     Prior to Admission medications   Medication Sig Start Date End Date Taking? Authorizing Provider  benztropine (COGENTIN) 0.5 MG tablet Take 0.5 mg by mouth 2 (two) times daily.   Yes Historical Provider, MD  haloperidol decanoate (HALDOL DECANOATE) 50 MG/ML injection Patient received 25 mg IM dose on 05/26/11. 05/26/11 05/26/11  Sanjuana Kava, NP  haloperidol decanoate (HALDOL DECANOATE) 50 MG/ML injection Inject 100 mg into the muscle every 28 (twenty-eight) days.    Historical Provider, MD  levothyroxine (SYNTHROID, LEVOTHROID) 50 MCG tablet Take 2 tablets (100 mcg total) by mouth daily. 10/05/11 10/04/12  Remi Haggard, NP  lisinopril-hydrochlorothiazide (PRINZIDE,ZESTORETIC) 20-12.5 MG per tablet Take 1 tablet by mouth daily. 05/27/11   Mike Craze, MD  potassium chloride (K-DUR) 10 MEQ tablet Take 1 tablet (10 mEq total) by mouth daily. 06/27/12   Leroy Sea, MD     Assessment & Plan   Gen Fatigue - UA stable, TSH ordered pending, Low K replaced, placed on PO K supplement, asked to follow with PCP in 1 week to get get CBC, BMP, TSH, Free T3, free T4, B12,Folic Acid and A1c checked. Advised on regular exercise and hydration.    Follow-up Information   Follow up with PCP[facility]. Schedule an appointment as soon as possible for a visit in 1 week. (get b12, A1c, Folic Acid, CBC and BMP checked)        Leroy Sea M.D on 06/27/2012 at 3:54 PM   Leroy Sea, MD 06/27/12 [phone]

## 2012-06-29 LAB — URINE CULTURE

## 2012-09-29 NOTE — Progress Notes (Signed)
Quick Note:  Please hava patient come back for repeat TSH ______ 

## 2012-10-05 ENCOUNTER — Telehealth: Payer: Self-pay | Admitting: *Deleted

## 2012-10-05 NOTE — Telephone Encounter (Signed)
10/05/12 Spoke with patient made aware of lab results . Appt. Made to have TSH level checked per doctor. P.Leanda Padmore,RN BSN MHA

## 2012-10-11 ENCOUNTER — Ambulatory Visit: Payer: Self-pay | Attending: Family Medicine

## 2012-10-11 DIAGNOSIS — R7989 Other specified abnormal findings of blood chemistry: Secondary | ICD-10-CM | POA: Insufficient documentation

## 2012-12-28 ENCOUNTER — Other Ambulatory Visit (HOSPITAL_COMMUNITY): Payer: Self-pay | Admitting: *Deleted

## 2012-12-28 DIAGNOSIS — Z1231 Encounter for screening mammogram for malignant neoplasm of breast: Secondary | ICD-10-CM

## 2013-01-12 ENCOUNTER — Encounter (HOSPITAL_COMMUNITY): Payer: Self-pay

## 2013-01-12 ENCOUNTER — Other Ambulatory Visit: Payer: Self-pay

## 2013-01-12 ENCOUNTER — Ambulatory Visit (HOSPITAL_COMMUNITY)
Admission: RE | Admit: 2013-01-12 | Discharge: 2013-01-12 | Disposition: A | Discharge status: Home / Self Care | Payer: Self-pay | Source: Ambulatory Visit | Attending: *Deleted | Admitting: *Deleted

## 2013-01-12 ENCOUNTER — Emergency Department (HOSPITAL_COMMUNITY)
Admission: EM | Admit: 2013-01-12 | Discharge: 2013-01-12 | Disposition: A | Discharge status: Home / Self Care | Payer: Self-pay | Attending: Emergency Medicine | Admitting: Emergency Medicine

## 2013-01-12 DIAGNOSIS — I10 Essential (primary) hypertension: Secondary | ICD-10-CM | POA: Insufficient documentation

## 2013-01-12 DIAGNOSIS — E079 Disorder of thyroid, unspecified: Secondary | ICD-10-CM | POA: Insufficient documentation

## 2013-01-12 DIAGNOSIS — Z8659 Personal history of other mental and behavioral disorders: Secondary | ICD-10-CM | POA: Insufficient documentation

## 2013-01-12 DIAGNOSIS — Z1231 Encounter for screening mammogram for malignant neoplasm of breast: Secondary | ICD-10-CM

## 2013-01-12 DIAGNOSIS — Z91040 Latex allergy status: Secondary | ICD-10-CM | POA: Insufficient documentation

## 2013-01-12 DIAGNOSIS — Z79899 Other long term (current) drug therapy: Secondary | ICD-10-CM | POA: Insufficient documentation

## 2013-01-12 DIAGNOSIS — Z853 Personal history of malignant neoplasm of breast: Secondary | ICD-10-CM | POA: Insufficient documentation

## 2013-01-12 DIAGNOSIS — F17200 Nicotine dependence, unspecified, uncomplicated: Secondary | ICD-10-CM | POA: Insufficient documentation

## 2013-01-12 LAB — CBC WITH DIFFERENTIAL/PLATELET
Basophils Absolute: 0 10*3/uL (ref 0.0–0.1)
Basophils Relative: 1 % (ref 0–1)
Eosinophils Relative: 2 % (ref 0–5)
HCT: 40.9 % (ref 36.0–46.0)
MCHC: 34.5 g/dL (ref 30.0–36.0)
MCV: 85 fL (ref 78.0–100.0)
Monocytes Absolute: 0.4 10*3/uL (ref 0.1–1.0)
RDW: 14.3 % (ref 11.5–15.5)

## 2013-01-12 LAB — COMPREHENSIVE METABOLIC PANEL
AST: 17 U/L (ref 0–37)
Albumin: 4 g/dL (ref 3.5–5.2)
CO2: 28 mEq/L (ref 19–32)
Calcium: 9.5 mg/dL (ref 8.4–10.5)
Creatinine, Ser: 0.76 mg/dL (ref 0.50–1.10)
GFR calc non Af Amer: >90 mL/min (ref >90)

## 2013-01-12 MED ORDER — HYDROCHLOROTHIAZIDE 25 MG PO TABS: 25.0000 mg | ORAL_TABLET | Freq: Every day | ORAL | Status: DC

## 2013-01-12 NOTE — ED Notes (Signed)
Discharge instructions reviewed with pt. Pt verbalized understanding.   

## 2013-01-12 NOTE — ED Notes (Signed)
Pt reports she went to [facility] today for medication refill, upon taking her BP they sent her here for further evaluation. Pt reports having a headache Wednesday, pt states she thought her head was hurting because "her hair dresser braided her hair too tight." Pt currently denies chest pain, N/V, headache

## 2013-01-12 NOTE — ED Provider Notes (Signed)
CSN: [id number]     Arrival date & time 01/12/13  1410 History   First MD Initiated Contact with Patient 01/12/13 1535     Chief Complaint  Patient presents with  . Hypertension   (Consider location/radiation/quality/duration/timing/severity/associated sxs/prior Treatment) Patient is a 52 y.o. female presenting with hypertension. The history is provided by the patient.  Hypertension Pertinent negatives include no chest pain, no abdominal pain, no headaches and no shortness of breath.   patient was sent to the ED from [facility] for hypertension. She has been on antihypertensive. She was on lisinopril and hydrochlorothiazide. She states recently her doctor stopped the hydrochlorothiazide. She was found to be hypertensive with systolic pressures in the 200s. No chest pain. No trouble breathing. Patient states she had a headache 2 days ago but that has resolved. No numbness or weakness. No change in urination.  Past Medical History  Diagnosis Date  . Bipolar disorder   . Breast cancer     with chemo - 1997  . Hypertension   . Thyroid disease    Past Surgical History  Procedure Laterality Date  . Mastectomy      left breat mastectomy 1997  . Tubal ligation      1992   Family History  Problem Relation Age of Onset  . Stroke Father   . Diabetes Brother   . Coronary artery disease Brother    History  Substance Use Topics  . Smoking status: Current Every Day Smoker -- 0.50 packs/day for 37 years    Types: Cigarettes  . Smokeless tobacco: Not on file  . Alcohol Use: No     Comment: Pt denies   OB History   Grav Para Term Preterm Abortions TAB SAB Ect Mult Living                 Review of Systems  Constitutional: Negative for activity change and appetite change.  HENT: Negative for neck stiffness.   Eyes: Negative for pain.  Respiratory: Negative for chest tightness and shortness of breath.   Cardiovascular: Negative for chest pain and leg swelling.  Gastrointestinal: Negative  for nausea, vomiting, abdominal pain and diarrhea.  Genitourinary: Negative for flank pain.  Musculoskeletal: Negative for back pain.  Skin: Negative for rash.  Neurological: Negative for weakness, numbness and headaches.  Psychiatric/Behavioral: Negative for behavioral problems.    Allergies  Latex  Home Medications   Current Outpatient Rx  Name  Route  Sig  Dispense  Refill  . benztropine (COGENTIN) 0.5 MG tablet   Oral   Take 0.5 mg by mouth 2 (two) times daily.         . haloperidol (HALDOL) 2 MG tablet   Oral   Take 2 mg by mouth daily.         Marland Kitchen levothyroxine (SYNTHROID, LEVOTHROID) 112 MCG tablet   Oral   Take 112 mcg by mouth daily before breakfast.         . lisinopril (PRINIVIL,ZESTRIL) 20 MG tablet   Oral   Take 20 mg by mouth daily.         Marland Kitchen EXPIRED: haloperidol decanoate (HALDOL DECANOATE) 50 MG/ML injection      Patient received 25 mg IM dose on 05/26/11.   1 mL   0   . haloperidol decanoate (HALDOL DECANOATE) 50 MG/ML injection   Intramuscular   Inject 100 mg into the muscle every 28 (twenty-eight) days.         . hydrochlorothiazide (HYDRODIURIL) 25 MG tablet  Oral   Take 1 tablet (25 mg total) by mouth daily.   30 tablet   0   . EXPIRED: levothyroxine (SYNTHROID, LEVOTHROID) 50 MCG tablet   Oral   Take 2 tablets (100 mcg total) by mouth daily.   30 tablet   0    BP 193/95  Pulse 62  Temp(Src) 98.4 F (36.9 C) (Oral)  Resp 17  Ht 5\' 4"  (1.626 m)  Wt 217 lb (98.431 kg)  BMI 37.23 kg/m2  SpO2 99% Physical Exam  Nursing note and vitals reviewed. Constitutional: She is oriented to person, place, and time. She appears well-developed and well-nourished.  HENT:  Head: Normocephalic and atraumatic.  Eyes: EOM are normal. Pupils are equal, round, and reactive to light.  Neck: Normal range of motion. Neck supple.  Cardiovascular: Normal rate, regular rhythm and normal heart sounds.   No murmur heard. Pulmonary/Chest: Effort  normal and breath sounds normal. No respiratory distress. She has no wheezes. She has no rales.  Abdominal: Soft. Bowel sounds are normal. She exhibits no distension. There is no tenderness. There is no rebound and no guarding.  Musculoskeletal: Normal range of motion.  Neurological: She is alert and oriented to person, place, and time. No cranial nerve deficit.  Skin: Skin is warm and dry.  Psychiatric: She has a normal mood and affect. Her speech is normal.    ED Course  Procedures (including critical care time) Labs Review Labs Reviewed  CBC WITH DIFFERENTIAL - Abnormal; Notable for the following:    Neutrophils Relative % 39 (*)    Lymphocytes Relative 53 (*)    All other components within normal limits  COMPREHENSIVE METABOLIC PANEL   Imaging Review No results found.  Date: 01/12/2013  Rate: 74  Rhythm: normal sinus rhythm  QRS Axis: left  Intervals: normal  ST/T Wave abnormalities: normal  Conduction Disutrbances:none  Narrative Interpretation: Incomplete right bundle branch block  Old EKG Reviewed: unchanged   MDM   1. Hypertension    Patient with chest pain. No sign of end organ damage. Recently had change in her medication. Will restart hydrochlorothiazide. Discharge home to follow with her PCP as planned    [name]. Rubin Payor, MD 01/12/13 [phone]

## 2013-01-12 NOTE — ED Notes (Signed)
Pt went to [facility] because she is a pt there and when they took her vital signs her blood pressure was elevated and she was told to come here for further evaluation. Pt states she has HTN and takes lisinipril. In July/Aug she had a dose change but has not been checking her pressure to see if it has been elevated. Pt denies any headache, dizziness, or blurred vision.

## 2013-01-12 NOTE — ED Notes (Signed)
Pt. Called for a room twice with no answer

## 2013-01-12 NOTE — ED Notes (Signed)
Pt. Came back to desk and wanted to know how many people were in front of her and I told her we had called her a couple of times and she did not answer.  Pt. Pulled out of discharge and placed back in waiting room to be placed in a room.

## 2013-01-15 ENCOUNTER — Other Ambulatory Visit: Payer: Self-pay | Admitting: Nurse Practitioner

## 2013-01-15 DIAGNOSIS — R928 Other abnormal and inconclusive findings on diagnostic imaging of breast: Secondary | ICD-10-CM

## 2013-01-15 LAB — POCT I-STAT TROPONIN I: Troponin i, poc: 0 ng/mL (ref 0.00–0.08)

## 2013-01-17 ENCOUNTER — Other Ambulatory Visit: Payer: Self-pay | Admitting: Family Medicine

## 2013-01-30 ENCOUNTER — Ambulatory Visit (HOSPITAL_COMMUNITY)
Admission: RE | Admit: 2013-01-30 | Discharge: 2013-01-30 | Disposition: A | Discharge status: Home / Self Care | Payer: Self-pay | Source: Ambulatory Visit | Attending: Obstetrics and Gynecology | Admitting: Obstetrics and Gynecology

## 2013-01-30 ENCOUNTER — Encounter (HOSPITAL_COMMUNITY): Payer: Self-pay

## 2013-01-30 ENCOUNTER — Other Ambulatory Visit: Payer: Self-pay

## 2013-01-30 VITALS — BP 118/86 | Temp 98.4°F | Ht 63.0 in | Wt 209.0 lb

## 2013-01-30 DIAGNOSIS — Z1239 Encounter for other screening for malignant neoplasm of breast: Secondary | ICD-10-CM

## 2013-01-31 ENCOUNTER — Ambulatory Visit
Admission: RE | Admit: 2013-01-31 | Discharge: 2013-01-31 | Disposition: A | Discharge status: Home / Self Care | Payer: Self-pay | Source: Ambulatory Visit | Attending: Nurse Practitioner | Admitting: Nurse Practitioner

## 2013-01-31 DIAGNOSIS — R928 Other abnormal and inconclusive findings on diagnostic imaging of breast: Secondary | ICD-10-CM

## 2013-01-31 NOTE — Patient Instructions (Signed)
Taught Jacqueline Berry how to perform BSE and gave educational materials to take home. Patient did not need a Pap smear today due to last Pap smear was 05/08/2010. Let her know [facility] will cover Pap smears every 3 years unless has a history of abnormal Pap smears. Reminded patient that her next Pap smear is due January 2015 and that she can have done through [employer].  Referred patient to the [facility] for right breast diagnostic mammogram per recommendation. Appointment scheduled for Wednesday, January 31, 2013 at 1000. Patient aware of appointment and will be there. Smoking cessation discussed with patient.  Jacqueline Berry verbalized understanding.  Kam Kushnir, Kathaleen Maser, RN 11:57 AM

## 2013-01-31 NOTE — Progress Notes (Signed)
Patient referred to [facility] by the [facility] due to needing additional imaging of the right breast. Screening mammogram completed at [facility] on 01/12/2013.  Pap Smear:    Pap smear not completed today. Last Pap smear was 05/08/2010 and normal. Per patient has no history of an abnormal Pap smear. Last Pap smear result is in EPIC.  Physical exam: Breasts Right breast larger than left breast due to patient has a history of a left breast mastectomy due to breast cancer. No skin abnormalities bilateral breasts. No nipple retraction right breast. No nipple discharge right breast. No lymphadenopathy. No lumps palpated bilateral breasts. No complaints of pain or tenderness on exam. Patient escorted to mammography for a screening mammogram. Referred patient to the [facility] for right breast diagnostic mammogram per recommendation. Appointment scheduled for Wednesday, January 31, 2013 at 1000.  Pelvic/Bimanual No Pap smear completed today since last Pap smear was 05/08/2010. Pap smear not indicated per BCCCP guidelines.

## 2013-05-15 ENCOUNTER — Encounter (INDEPENDENT_AMBULATORY_CARE_PROVIDER_SITE_OTHER): Payer: Self-pay

## 2013-05-15 ENCOUNTER — Ambulatory Visit (HOSPITAL_COMMUNITY)
Admission: RE | Admit: 2013-05-15 | Discharge: 2013-05-15 | Disposition: A | Discharge status: Home / Self Care | Payer: Self-pay | Source: Ambulatory Visit | Attending: Obstetrics and Gynecology | Admitting: Obstetrics and Gynecology

## 2013-05-15 ENCOUNTER — Encounter (HOSPITAL_COMMUNITY): Payer: Self-pay

## 2013-05-15 VITALS — BP 110/76 | Temp 98.2°F | Ht 63.0 in | Wt 205.6 lb

## 2013-05-15 DIAGNOSIS — N898 Other specified noninflammatory disorders of vagina: Secondary | ICD-10-CM

## 2013-05-15 DIAGNOSIS — Z01419 Encounter for gynecological examination (general) (routine) without abnormal findings: Secondary | ICD-10-CM

## 2013-05-15 NOTE — Patient Instructions (Signed)
Let her know [facility] will cover Pap smears every 3 years unless has a history of abnormal Pap smears. Let patient know will follow up with her within the next couple weeks with results by phone. Jacqueline Berry verbalized understanding.  Janasha Barkalow, Arvil Chaco, RN 12:50 PM

## 2013-05-15 NOTE — Progress Notes (Signed)
No complaints today.  Pap Smear:  Pap smear completed today. Patients last Pap smear was 05/08/2010 and normal. Per patient has no history of an abnormal Pap smear. Last Pap smear result is in EPIC.    Pelvic/Bimanual   Ext Genitalia No lesions, no swelling and no discharge observed on external genitalia.         Vagina Vagina pink and normal texture. No lesions and white frothy discharge observed in vagina. Wet prep completed.         Cervix Cervix is present. Cervix pink and of normal texture. No discharge observed.     Uterus Uterus is present and palpable. Uterus is retroverted and normal size.        Adnexae Bilateral ovaries present and palpable. No tenderness on palpation.          Rectovaginal No rectal exam completed today since patient had no rectal complaints. No skin abnormalities observed on exam.

## 2013-05-17 ENCOUNTER — Other Ambulatory Visit (HOSPITAL_COMMUNITY): Payer: Self-pay | Admitting: *Deleted

## 2013-05-17 ENCOUNTER — Telehealth (HOSPITAL_COMMUNITY): Payer: Self-pay | Admitting: *Deleted

## 2013-05-17 DIAGNOSIS — B379 Candidiasis, unspecified: Secondary | ICD-10-CM

## 2013-05-17 MED ORDER — FLUCONAZOLE 150 MG PO TABS: 150.0000 mg | ORAL_TABLET | Freq: Once | ORAL | Status: DC

## 2013-05-17 NOTE — Telephone Encounter (Signed)
Telephoned patient and discussed negative pap smear results. Next pap smear due in 3 years.  Advised patient wet prep did show yeast infection and medication was called in to pharmacy. Patient voiced understanding.

## 2013-06-01 ENCOUNTER — Ambulatory Visit (HOSPITAL_BASED_OUTPATIENT_CLINIC_OR_DEPARTMENT_OTHER): Payer: Self-pay

## 2013-06-01 ENCOUNTER — Other Ambulatory Visit: Payer: Self-pay

## 2013-06-01 VITALS — BP 124/94 | HR 72 | Temp 98.2°F | Resp 24 | Ht 62.0 in | Wt 203.6 lb

## 2013-06-01 DIAGNOSIS — Z0000 Encounter for general adult medical examination without abnormal findings: Secondary | ICD-10-CM

## 2013-06-01 LAB — GLUCOSE (CC13): Glucose: 90 mg/dl (ref 70–140)

## 2013-06-01 LAB — LIPID PANEL
Cholesterol: 136 mg/dL (ref 0–200)
HDL: 31 mg/dL — ABNORMAL LOW (ref >39)
LDL Cholesterol: 84 mg/dL (ref 0–99)
Total CHOL/HDL Ratio: 4.4 Ratio
Triglycerides: 104 mg/dL (ref <150)
VLDL: 21 mg/dL (ref 0–40)

## 2013-06-01 LAB — HEMOGLOBIN A1C
HEMOGLOBIN A1C: 5.6 % (ref <5.7)
MEAN PLASMA GLUCOSE: 114 mg/dL (ref <117)

## 2013-06-01 NOTE — Progress Notes (Signed)
Patient is a new patient to the [facility] program and is currently a [facility] patient effective 01/30/2013.   Clinical Measurements: Patient is 5 ft. 2 inches, weight 203.6 lbs, waist circumference 39 inches, and hip circumference 43.75 inches.   Medical History: Patient is unsure if she has any history of high cholesterol and doesn't take any medications for it. Patient does have a history of hypertension that takes Lisinopril 20 mg daily and .Per patient takes her medication everyday. Per patient has no history of diabetes or takes any medications for diabetes. Per patient no diagnosed history of coronary heart disease, heart attack, heart failure, stroke/TIA, vascular disease or congenital heart defects.   Blood Pressure, Self-measurement:  Patient states she has never been told to check BP and does not know how to check. She states Kevan Ny is her doctor and she saw him last week and BP was normal. Cuff size today was adult and needed large adult for accurate reading.  Nutrition Assessment: Patient stated eats fruit once a daily. Patient states she does not  vegetables daily. She eats maybe every other day.Patient states does eat 3 or more ounces of whole grains daily. Patient doesn't eat two or more servings of fish weekly. Patient does state she drinks more than 36 ounces or 450 calories of beverages with added sugars weekly. Patient states she drinks  Drinks coffee daily with a lot of sugar and few juices and 2-3 sodas a week.. Patient stated she does watch her salt intake not adding salt to foods due to her history of hypertension.   Physical Activity Assessment: Patient stated she does around 210 minutes of moderate exercise weekly including walking and household chores. Patient stated she does not do any vigorous physical activity on a regular basis.   Smoking Status: Patient is a current every day smoker who smokes 6 to 7 cigarettes daily. Patient states she would like to quit. Have signed  her up for the upcoming free smoking cessation classes offered by the [facility] starting on June 18, 2013. Patient given all of the dates, times, and location of the upcoming classes.   Quality of Life Assessment: In assessing patient's quality of life she stated that out of the past 30 days that she has felt her health was good all of them. Patient also stated that in the past 30 days that her mental health was not good  2-3 days including stress, depression and problems with emotions. Patient did stated that nothing kept her from doing her usual activities including self-care, work or recreation.   Plan: Lab work will be done today including a lipid panel, glucose and Hgb A1C.  Appointment will be scheduled for. Patient scheduled to attend smoking cessation classes starting June 18, 2013. Will follow up with patient in 30-60 days by phone or in person.

## 2013-06-01 NOTE — Patient Instructions (Signed)
Discussed health assessment with patient. Talked with patient about smoking cessation and gave resources. Signed patient up for the next smoking cessation class at the [facility] starting Monday, June 18, 2013. Gave patient all the dates, times and location.  Let patient know that will call her to follow up and if have any questions to call me. After get lab results will call because patient voiced need to lose weight and eat properly.  Patient verbalized understanding.

## 2013-06-04 ENCOUNTER — Telehealth: Payer: Self-pay

## 2013-06-04 NOTE — Telephone Encounter (Signed)
Called patient lab results: Bld. Glucose - 90, HgbA1C - 5.6, Cholesterol 136, HDL - 31, LDL - 84, Triglycerides - 104. Discussed the importance of exercise.good oils and fiber to improve HDL's. Stated she understood.  PLAN: Made appointment for health coaching for March 12 th at 10 AM for Nutrition/weight loss. Will give handout on improving HDL's.

## 2013-06-28 ENCOUNTER — Ambulatory Visit: Payer: Self-pay

## 2013-06-29 ENCOUNTER — Ambulatory Visit: Payer: Self-pay

## 2013-07-13 ENCOUNTER — Ambulatory Visit (HOSPITAL_BASED_OUTPATIENT_CLINIC_OR_DEPARTMENT_OTHER): Payer: Self-pay

## 2013-07-13 ENCOUNTER — Other Ambulatory Visit: Payer: Self-pay

## 2013-07-13 DIAGNOSIS — Z0000 Encounter for general adult medical examination without abnormal findings: Secondary | ICD-10-CM

## 2013-07-13 LAB — HEMOGLOBIN A1C
HEMOGLOBIN A1C: 5.6 % (ref <5.7)
MEAN PLASMA GLUCOSE: 114 mg/dL (ref <117)

## 2013-07-13 LAB — LIPID PANEL
Cholesterol: 142 mg/dL (ref 0–200)
HDL: 30 mg/dL — ABNORMAL LOW (ref >39)
LDL Cholesterol: 85 mg/dL (ref 0–99)
Total CHOL/HDL Ratio: 4.7 Ratio
Triglycerides: 137 mg/dL (ref <150)
VLDL: 27 mg/dL (ref 0–40)

## 2013-07-13 LAB — GLUCOSE (CC13): GLUCOSE: 108 mg/dL (ref 70–140)

## 2013-07-13 NOTE — Patient Instructions (Signed)
Discussed health assessment with patient. Talked with patient about smoking cessation. Discussed Health Coaching options.  Patient verbalized understanding.

## 2013-07-13 NOTE — Progress Notes (Signed)
Patient is a new patient to the [facility] program and is currently a [facility] patient effective 01/30/2013.   Clinical Measurements: Patient is 5 ft. 1 inches, weight 201 lbs, waist circumference 40 inches, and hip circumference 43.5 inches.   Medical History: Patient has no history of high cholesterol. Patient does not have a history of diabetes.Patient does have a history of High Blood pressure ans takes lisinopril and HCTZ. Per patient takes medication everyday. Per patient no diagnosed history of,heart failure, stroke/TIA, vascular disease or congenital heart defects. Patient stated that a doctor told her that she had had two silent heart attacks.                                                                                                                                                                                                                       Blood Pressure, Self-measurement: Patient states has not checked Blood pressure because does not have equipment.  Nutrition Assessment: Patient stated that eats only one banana a week. Patient stated that does not eat vegetables everyday.Patient does eat 3 or more ounces of whole grains daily. Patient doesn't eat two or more servings of fish weekly.  Patient states she does drink more than 36 ounces or 450 calories of beverages with added sugars weekly. Patient stated she does not watch her salt intake.    Physical Activity Assessment: Patient stated she does not do vigorous acticity. Per patient cleans and household activities for a around 480 minutes of moderate activity.  Smoking Status: Patient is a current smoker and had signed up for a [employer] smoking cessation class. She did not show up for the class. Patient stated that wants to quit smoking. Patient is not exposed to smoke.  Quality of Life Assessment: In assessing patient's quality of life she stated that out of the past 30 days that she has felt her health is good all of them.  Patient also stated that in the past 30 days that her mental health is not good including stress, depression and problems with emotions for 3 days. Patient did state that out of the past 30 days she felt her physical or mental health had not kept her from doing her usual activities including self-care, work or recreation.   Plan: Lab work will be done today including a lipid panel, blood glucose, and Hgb A1C. Will call lab results when they are finished. Patient wants to return for Health Coaching regarding activity and smoking Cessation.

## 2014-01-10 ENCOUNTER — Other Ambulatory Visit (HOSPITAL_COMMUNITY): Payer: Self-pay | Admitting: Internal Medicine

## 2014-02-11 ENCOUNTER — Other Ambulatory Visit: Payer: Self-pay | Admitting: Obstetrics and Gynecology

## 2014-02-11 DIAGNOSIS — Z1231 Encounter for screening mammogram for malignant neoplasm of breast: Secondary | ICD-10-CM

## 2014-02-12 ENCOUNTER — Ambulatory Visit (HOSPITAL_COMMUNITY): Payer: Self-pay | Attending: Obstetrics and Gynecology

## 2014-02-15 ENCOUNTER — Ambulatory Visit (HOSPITAL_COMMUNITY): Payer: Self-pay

## 2014-02-18 ENCOUNTER — Encounter (HOSPITAL_COMMUNITY): Payer: Self-pay

## 2014-04-23 ENCOUNTER — Other Ambulatory Visit: Payer: Self-pay

## 2014-04-23 DIAGNOSIS — Z1231 Encounter for screening mammogram for malignant neoplasm of breast: Secondary | ICD-10-CM

## 2014-05-07 ENCOUNTER — Ambulatory Visit
Admission: RE | Admit: 2014-05-07 | Discharge: 2014-05-07 | Disposition: A | Discharge status: Home / Self Care | Payer: Commercial Managed Care - HMO | Source: Ambulatory Visit

## 2014-05-07 DIAGNOSIS — Z1231 Encounter for screening mammogram for malignant neoplasm of breast: Secondary | ICD-10-CM

## 2014-05-16 ENCOUNTER — Other Ambulatory Visit (INDEPENDENT_AMBULATORY_CARE_PROVIDER_SITE_OTHER): Payer: Commercial Managed Care - HMO

## 2014-05-16 ENCOUNTER — Encounter: Payer: Self-pay | Admitting: Internal Medicine

## 2014-05-16 ENCOUNTER — Ambulatory Visit (INDEPENDENT_AMBULATORY_CARE_PROVIDER_SITE_OTHER): Payer: Commercial Managed Care - HMO | Admitting: Internal Medicine

## 2014-05-16 VITALS — BP 120/84 | Temp 97.7°F | Wt 190.0 lb

## 2014-05-16 DIAGNOSIS — F17200 Nicotine dependence, unspecified, uncomplicated: Secondary | ICD-10-CM

## 2014-05-16 DIAGNOSIS — E039 Hypothyroidism, unspecified: Secondary | ICD-10-CM

## 2014-05-16 DIAGNOSIS — F200 Paranoid schizophrenia: Secondary | ICD-10-CM

## 2014-05-16 DIAGNOSIS — E669 Obesity, unspecified: Secondary | ICD-10-CM

## 2014-05-16 DIAGNOSIS — Z720 Tobacco use: Secondary | ICD-10-CM

## 2014-05-16 DIAGNOSIS — I10 Essential (primary) hypertension: Secondary | ICD-10-CM

## 2014-05-16 LAB — COMPREHENSIVE METABOLIC PANEL
ALK PHOS: 86 U/L (ref 39–117)
ALT: 20 U/L (ref 0–35)
AST: 20 U/L (ref 0–37)
Albumin: 4 g/dL (ref 3.5–5.2)
BUN: 8 mg/dL (ref 6–23)
CO2: 28 meq/L (ref 19–32)
CREATININE: 0.77 mg/dL (ref 0.40–1.20)
Calcium: 9.5 mg/dL (ref 8.4–10.5)
Chloride: 105 mEq/L (ref 96–112)
GFR: 100.3 mL/min (ref >60.00)
Glucose, Bld: 85 mg/dL (ref 70–99)
Potassium: 3.6 mEq/L (ref 3.5–5.1)
SODIUM: 143 meq/L (ref 135–145)
TOTAL PROTEIN: 7.3 g/dL (ref 6.0–8.3)
Total Bilirubin: 0.4 mg/dL (ref 0.2–1.2)

## 2014-05-16 LAB — HEMOGLOBIN A1C: HEMOGLOBIN A1C: 5.9 % (ref 4.6–6.5)

## 2014-05-16 LAB — TSH: TSH: 0.36 u[IU]/mL (ref 0.35–4.50)

## 2014-05-16 NOTE — Progress Notes (Signed)
Pre visit review using our clinic review tool, if applicable. No additional management support is needed unless otherwise documented below in the visit note. 

## 2014-05-16 NOTE — Patient Instructions (Signed)
We will check your blood work today and call you back with the results.   Your blood pressure is doing good today, we will see you back in 6 months.  We will try to get the records from the colon cancer screening but it may be time for another screening. We can talk about it next time.  Health Maintenance Adopting a healthy lifestyle and getting preventive care can go a long way to promote health and wellness. Talk with your health care provider about what schedule of regular examinations is right for you. This is a good chance for you to check in with your provider about disease prevention and staying healthy. In between checkups, there are plenty of things you can do on your own. Experts have done a lot of research about which lifestyle changes and preventive measures are most likely to keep you healthy. Ask your health care provider for more information. WEIGHT AND DIET  Eat a healthy diet  Be sure to include plenty of vegetables, fruits, low-fat dairy products, and lean protein.  Do not eat a lot of foods high in solid fats, added sugars, or salt.  Get regular exercise. This is one of the most important things you can do for your health.  Most adults should exercise for at least 150 minutes each week. The exercise should increase your heart rate and make you sweat (moderate-intensity exercise).  Most adults should also do strengthening exercises at least twice a week. This is in addition to the moderate-intensity exercise.  Maintain a healthy weight  Body mass index (BMI) is a measurement that can be used to identify possible weight problems. It estimates body fat based on height and weight. Your health care provider can help determine your BMI and help you achieve or maintain a healthy weight.  For females 74 years of age and older:   A BMI below 18.5 is considered underweight.  A BMI of 18.5 to 24.9 is normal.  A BMI of 25 to 29.9 is considered overweight.  A BMI of 30 and  above is considered obese.  Watch levels of cholesterol and blood lipids  You should start having your blood tested for lipids and cholesterol at 55 years of age, then have this test every 5 years.  You may need to have your cholesterol levels checked more often if:  Your lipid or cholesterol levels are high.  You are older than 55 years of age.  You are at high risk for heart disease.  CANCER SCREENING   Lung Cancer  Lung cancer screening is recommended for adults 53-62 years old who are at high risk for lung cancer because of a history of smoking.  A yearly low-dose CT scan of the lungs is recommended for people who:  Currently smoke.  Have quit within the past 15 years.  Have at least a 30-pack-year history of smoking. A pack year is smoking an average of one pack of cigarettes a day for 1 year.  Yearly screening should continue until it has been 15 years since you quit.  Yearly screening should stop if you develop a health problem that would prevent you from having lung cancer treatment.  Breast Cancer  Practice breast self-awareness. This means understanding how your breasts normally appear and feel.  It also means doing regular breast self-exams. Let your health care provider know about any changes, no matter how small.  If you are in your 20s or 30s, you should have a clinical breast exam (  CBE) by a health care provider every 1-3 years as part of a regular health exam.  If you are 40 or older, have a CBE every year. Also consider having a breast X-ray (mammogram) every year.  If you have a family history of breast cancer, talk to your health care provider about genetic screening.  If you are at high risk for breast cancer, talk to your health care provider about having an MRI and a mammogram every year.  Breast cancer gene (BRCA) assessment is recommended for women who have family members with BRCA-related cancers. BRCA-related cancers  include:  Breast.  Ovarian.  Tubal.  Peritoneal cancers.  Results of the assessment will determine the need for genetic counseling and BRCA1 and BRCA2 testing. Cervical Cancer Routine pelvic examinations to screen for cervical cancer are no longer recommended for nonpregnant women who are considered low risk for cancer of the pelvic organs (ovaries, uterus, and vagina) and who do not have symptoms. A pelvic examination may be necessary if you have symptoms including those associated with pelvic infections. Ask your health care provider if a screening pelvic exam is right for you.   The Pap test is the screening test for cervical cancer for women who are considered at risk.  If you had a hysterectomy for a problem that was not cancer or a condition that could lead to cancer, then you no longer need Pap tests.  If you are older than 65 years, and you have had normal Pap tests for the past 10 years, you no longer need to have Pap tests.  If you have had past treatment for cervical cancer or a condition that could lead to cancer, you need Pap tests and screening for cancer for at least 20 years after your treatment.  If you no longer get a Pap test, assess your risk factors if they change (such as having a new sexual partner). This can affect whether you should start being screened again.  Some women have medical problems that increase their chance of getting cervical cancer. If this is the case for you, your health care provider may recommend more frequent screening and Pap tests.  The human papillomavirus (HPV) test is another test that may be used for cervical cancer screening. The HPV test looks for the virus that can cause cell changes in the cervix. The cells collected during the Pap test can be tested for HPV.  The HPV test can be used to screen women 30 years of age and older. Getting tested for HPV can extend the interval between normal Pap tests from three to five years.  An HPV  test also should be used to screen women of any age who have unclear Pap test results.  After 55 years of age, women should have HPV testing as often as Pap tests.  Colorectal Cancer  This type of cancer can be detected and often prevented.  Routine colorectal cancer screening usually begins at 55 years of age and continues through 55 years of age.  Your health care provider may recommend screening at an earlier age if you have risk factors for colon cancer.  Your health care provider may also recommend using home test kits to check for hidden blood in the stool.  A small camera at the end of a tube can be used to examine your colon directly (sigmoidoscopy or colonoscopy). This is done to check for the earliest forms of colorectal cancer.  Routine screening usually begins at age 50.    Direct examination of the colon should be repeated every 5-10 years through 55 years of age. However, you may need to be screened more often if early forms of precancerous polyps or small growths are found. Skin Cancer  Check your skin from head to toe regularly.  Tell your health care provider about any new moles or changes in moles, especially if there is a change in a mole's shape or color.  Also tell your health care provider if you have a mole that is larger than the size of a pencil eraser.  Always use sunscreen. Apply sunscreen liberally and repeatedly throughout the day.  Protect yourself by wearing long sleeves, pants, a wide-brimmed hat, and sunglasses whenever you are outside. HEART DISEASE, DIABETES, AND HIGH BLOOD PRESSURE   Have your blood pressure checked at least every 1-2 years. High blood pressure causes heart disease and increases the risk of stroke.  If you are between 83 years and 38 years old, ask your health care provider if you should take aspirin to prevent strokes.  Have regular diabetes screenings. This involves taking a blood sample to check your fasting blood sugar  level.  If you are at a normal weight and have a low risk for diabetes, have this test once every three years after 55 years of age.  If you are overweight and have a high risk for diabetes, consider being tested at a younger age or more often. PREVENTING INFECTION  Hepatitis B  If you have a higher risk for hepatitis B, you should be screened for this virus. You are considered at high risk for hepatitis B if:  You were born in a country where hepatitis B is common. Ask your health care provider which countries are considered high risk.  Your parents were born in a high-risk country, and you have not been immunized against hepatitis B (hepatitis B vaccine).  You have HIV or AIDS.  You use needles to inject street drugs.  You live with someone who has hepatitis B.  You have had sex with someone who has hepatitis B.  You get hemodialysis treatment.  You take certain medicines for conditions, including cancer, organ transplantation, and autoimmune conditions. Hepatitis C  Blood testing is recommended for:  Everyone born from [birth year] through 1966.  Anyone with known risk factors for hepatitis C. Sexually transmitted infections (STIs)  You should be screened for sexually transmitted infections (STIs) including gonorrhea and chlamydia if:  You are sexually active and are younger than 55 years of age.  You are older than 55 years of age and your health care provider tells you that you are at risk for this type of infection.  Your sexual activity has changed since you were last screened and you are at an increased risk for chlamydia or gonorrhea. Ask your health care provider if you are at risk.  If you do not have HIV, but are at risk, it may be recommended that you take a prescription medicine daily to prevent HIV infection. This is called pre-exposure prophylaxis (PrEP). You are considered at risk if:  You are sexually active and do not regularly use condoms or know the HIV status  of your partner(s).  You take drugs by injection.  You are sexually active with a partner who has HIV. Talk with your health care provider about whether you are at high risk of being infected with HIV. If you choose to begin PrEP, you should first be tested for HIV. You should then be  tested every 3 months for as long as you are taking PrEP.  PREGNANCY   If you are premenopausal and you may become pregnant, ask your health care provider about preconception counseling.  If you may become pregnant, take 400 to 800 micrograms (mcg) of folic acid every day.  If you want to prevent pregnancy, talk to your health care provider about birth control (contraception). OSTEOPOROSIS AND MENOPAUSE   Osteoporosis is a disease in which the bones lose minerals and strength with aging. This can result in serious bone fractures. Your risk for osteoporosis can be identified using a bone density scan.  If you are 65 years of age or older, or if you are at risk for osteoporosis and fractures, ask your health care provider if you should be screened.  Ask your health care provider whether you should take a calcium or vitamin D supplement to lower your risk for osteoporosis.  Menopause may have certain physical symptoms and risks.  Hormone replacement therapy may reduce some of these symptoms and risks. Talk to your health care provider about whether hormone replacement therapy is right for you.  HOME CARE INSTRUCTIONS   Schedule regular health, dental, and eye exams.  Stay current with your immunizations.   Do not use any tobacco products including cigarettes, chewing tobacco, or electronic cigarettes.  If you are pregnant, do not drink alcohol.  If you are breastfeeding, limit how much and how often you drink alcohol.  Limit alcohol intake to no more than 1 drink per day for nonpregnant women. One drink equals 12 ounces of beer, 5 ounces of wine, or 1 ounces of hard liquor.  Do not use street  drugs.  Do not share needles.  Ask your health care provider for help if you need support or information about quitting drugs.  Tell your health care provider if you often feel depressed.  Tell your health care provider if you have ever been abused or do not feel safe at home. Document Released: 10/19/2010 Document Revised: 08/20/2013 Document Reviewed: 03/07/2013 [employer] Patient Information 2015 [employer]. This information is not intended to replace advice given to you by your health care provider. Make sure you discuss any questions you have with your health care provider.  

## 2014-05-18 ENCOUNTER — Encounter: Payer: Self-pay | Admitting: Internal Medicine

## 2014-05-18 NOTE — Assessment & Plan Note (Signed)
Unclear if she is in remission but follows with [facility] and gets IM haldol at their office as well as haldol and cogentin daily. Will check LFTs, HgA1c, lipid panel as treatment long term has been associated with increased risk of impaired sugars, cholesterol.

## 2014-05-18 NOTE — Assessment & Plan Note (Signed)
BP well controlled on current regimen. Check BMP and adjust as needed. She is on lisinopril/hctz.

## 2014-05-18 NOTE — Progress Notes (Signed)
   Subjective:    Patient ID: Jacqueline Berry, female    DOB: Jan 28, 1961, 54 y.o.   MRN: [id number]  HPI The patient is a new patient who is here for preventative care. She has PMH of schizophrenia (well controlled on haldol (IM and PO) and cogentin), hypertension (well controlled, on lisinopril/hctz), hypothyroidism (well controlled on levothyroxine). She is not having any new complaints but wants to get refills and established here. She is also a smoker and has not thought about quitting before.   PMH, [facility], [place] reviewed and updated today. Allergies and problem list and medication list reviewed and updated today.  Review of Systems  Constitutional: Negative for fever, chills, activity change, appetite change and unexpected weight change.  HENT: Negative.   Eyes: Negative.   Respiratory: Negative for cough, chest tightness, shortness of breath and wheezing.   Cardiovascular: Negative for chest pain, palpitations and leg swelling.  Gastrointestinal: Negative for abdominal pain, diarrhea, constipation and abdominal distention.  Endocrine: Negative.   Musculoskeletal: Negative.   Skin: Negative.   Neurological: Negative.   Psychiatric/Behavioral: Negative.       Objective:   Physical Exam  Constitutional: She is oriented to person, place, and time. She appears well-developed and well-nourished.  HENT:  Head: Normocephalic and atraumatic.  Eyes: EOM are normal.  Neck: Normal range of motion.  Cardiovascular: Normal rate and regular rhythm.   Pulmonary/Chest: Effort normal and breath sounds normal. No respiratory distress. She has no wheezes.  Abdominal: Soft. Bowel sounds are normal. She exhibits no distension. There is no tenderness. There is no rebound.  Musculoskeletal: She exhibits no edema.  Neurological: She is alert and oriented to person, place, and time. Coordination normal.  Skin: Skin is warm and dry.  Psychiatric: She has a normal mood and affect.   Filed Vitals:   05/16/14 0953  BP: 120/84  Temp: 97.7 F (36.5 C)  TempSrc: Oral  Weight: 190 lb (86.183 kg)      Assessment & Plan:

## 2014-05-18 NOTE — Assessment & Plan Note (Signed)
Does not wish to quit at this time. Talked with her about the risks and harms of tobacco smoke.

## 2014-05-18 NOTE — Assessment & Plan Note (Signed)
Check TSH and adjust levothyroxine as needed.

## 2014-05-18 NOTE — Assessment & Plan Note (Signed)
Associated with treatment of mental illness and will check HgA1c and lipid panel.

## 2014-05-29 ENCOUNTER — Telehealth: Payer: Self-pay | Admitting: Internal Medicine

## 2014-05-29 ENCOUNTER — Other Ambulatory Visit: Payer: Self-pay | Admitting: Geriatric Medicine

## 2014-05-29 MED ORDER — LISINOPRIL 20 MG PO TABS: 20.0000 mg | ORAL_TABLET | Freq: Every day | ORAL | Status: DC

## 2014-05-29 MED ORDER — LEVOTHYROXINE SODIUM 112 MCG PO TABS: 112.0000 ug | ORAL_TABLET | Freq: Every day | ORAL | Status: DC

## 2014-05-29 NOTE — Telephone Encounter (Signed)
Sent to pharmacy 

## 2014-05-29 NOTE — Telephone Encounter (Signed)
Patient is requesting a script refill on lisinopril and levothyroxine.  Patient uses [facility]ediatric nurse at [facility].

## 2014-06-05 ENCOUNTER — Other Ambulatory Visit: Payer: Self-pay | Admitting: Geriatric Medicine

## 2014-06-05 ENCOUNTER — Telehealth: Payer: Self-pay | Admitting: Internal Medicine

## 2014-06-05 MED ORDER — HYDROCHLOROTHIAZIDE 25 MG PO TABS: 25.0000 mg | ORAL_TABLET | Freq: Every day | ORAL | Status: DC

## 2014-06-05 NOTE — Telephone Encounter (Signed)
Sent to pharmacy 

## 2014-06-05 NOTE — Telephone Encounter (Signed)
Pt called request refill for hydrochlorothiazide (HYDRODIURIL) 25 MG to be send into [facility] on [address].

## 2014-08-11 NOTE — H&P (Signed)
PATIENT NAME:  Jacqueline Berry, Jacqueline Berry MR#:  [id number] DATE OF BIRTH:  1961-03-29  DATE OF ADMISSION:  10/21/2011  REFERRING PHYSICIAN: Marjean Donna, MD    ADMITTING PHYSICIAN: Cephus Shelling, MD   REASON FOR ADMISSION: Paranoid and delusional thoughts.   IDENTIFYING INFORMATION: Jacqueline Berry is a 54 year old married African American female with a prior diagnosis of schizophrenia, paranoid type, who has been living with her husband in the [place] area. She has been working at [employer] since March of this year. She has three grown children, age 60, 55 and 54.   HISTORY OF PRESENT ILLNESS: Jacqueline Berry is a 54 year old married African American female with a prior diagnosis of paranoid schizophrenia who was brought to the Emergency Room by Police under involuntary commitment after endorsing paranoid and delusional thoughts when at [place]. The patient had told the police officer that she believed that somebody was putting money into her bank account at [facility] and also telling the officer and that she had died. The patient accused the officer of tazing her  and killing her. Collateral information from the patient's husband and sister indicate that the patient has been struggling with schizophrenia since her early 6s and had been maintained on Haldol Decanoate injection in the past through [facility]. The patient's husband states that when [facility] changed to [facility] she was switched to an oral antipsychotic. He is unsure which antipsychotic she was switched to. He states she has been noncompliant with the medication and approximately one week ago disappeared and left their household in [place]. The patient's sister said that she was "talking out of her head" and came to [facility] to stay with her niece. The patient had expressed thoughts that her husband was the devil and that it was not safe for her to stay there. Her husband states that she was hollering and telling  people that she was "healed" last week before leaving the house. Her husband also said that she was getting very paranoid that he was having affairs with other women just if he was talking to a female. Both substantiated the fact that the patient had auditory hallucinations that God was speaking to her and also visual hallucinations of seeing people do things that had not occurred. The patient herself was very guarded during the interview and reluctant to talk. She was noncooperative initially with giving a history or completing the physical exam. Insight and judgment were poor. When the patient initially came to the Emergency Room, she refused any medications and was given an IM injection of Haldol, Ativan and Benadryl and placed in restraints. Since that time, the patient has been more calm and cooperative with nursing staff but still guarded and reluctant to give history.   PAST PSYCHIATRIC HISTORY: Per the patient's husband and sister, she was diagnosed with schizophrenia in her early 18s and has been hospitalized multiple times in the past at [facility] as well as [facility]. She has expressed suicidal thoughts in the past but denies any history of any prior suicide attempts. The only medication the patient's family could identify her having been on in the past was Haldol. She is currently followed by [facility] but prior to that was followed by [facility] for over 20 years. The patient herself is currently denying any suicidal thoughts.   SUBSTANCE ABUSE HISTORY: The patient's family indicates that there is no history of any alcohol use or illicit drug use. Toxicology screen in the Emergency Room was negative  for all substances, and ethanol was less than 3.0.   FAMILY PSYCHIATRIC HISTORY: The patient's family denies that there is any history of any mental illness or substance abuse in the family.   PAST MEDICAL HISTORY:  1. Hypertension. 2. Hypothyroidism. 3. Left breast mastectomy with  history of breast cancer. 4. There is no history of any prior TBI or seizures, per the patient, as well as collateral information from her family.   OUTPATIENT MEDICATIONS:  1. Synthroid 0.112 mg p.o. daily. 2. Lisinopril 20 mg p.o. daily. 3. Hydrochlorothiazide 12.5 mg p.o. daily.   ALLERGIES: No known drug allergies.   SOCIAL HISTORY: The patient was born and raised in the [place] area by her father primarily. She says her mother passed away when she was 36 years old. She says she does not know how her mother died. She has three brothers and three sisters. One sister works at [employer] as a Quarry manager. The patient graduated high school and attended some classes at [employer]. Her husband reports that she has had a long history of job instability as she does tend to get paranoid on the job, believing that her coworkers are doing things to her and then ends up quitting or losing her job. She has been working at [employer] since March of this year. She has been married for 20 years and has three grown children, a 64 year old and a 45 year old daughter and a 71 year old son. She and her husband were living in the [place] area until the patient left the household about one week ago, and she has been staying with her niece in the [place] area since.   LEGAL HISTORY: The patient was arrested in 1991 for shoplifting. No current pending charges.   MENTAL STATUS EXAM: Jacqueline Berry is a 54 year old obese African American female who was wearing burgundy scrub pants and a lime green shirt. She was curled up in the fetal position on her stretcher in the Emergency Room. Eye contact was poor. The patient was guarded and reluctant to talk to this Probation officer. Speech was regular rate and rhythm but at times became loud and demanding. Affect was guarded. Mood was described as being "not good." Insight and judgment were poor. Attention and  concentration were poor. The patient was not wanting to answer questions with regards to memory and recall. She denied any current suicidal thoughts or homicidal thoughts. She denied any current auditory or visual hallucinations. She was endorsing paranoid thoughts and delusions, believing that she had died and that the devil was in her house in the [place] area. She also believed that people were taking money from her bank account at [facility].   SUICIDE RISK ASSESSMENT: At this time, due to psychosis, the patient remains at a moderately elevated risk of harm to self and others. She has been noncompliant with outpatient psychotropic medications and is currently endorsing psychotic symptoms. Per her husband, she does not have access to guns.   REVIEW OF SYSTEMS: CONSTITUTIONAL: She denies any weakness, fatigue or weight changes. She denies any fever, chills, or night sweats. HEAD: She denies any headaches or dizziness. EYES: She denies any diplopia or blurred vision. ENT: She denies any hearing loss, neck pain, or throat pain. RESPIRATORY: She denies any shortness of breath or cough. CARDIOVASCULAR: She denies any chest pain or orthopnea. GASTROINTESTINAL: She denies any nausea, vomiting, or abdominal pain. She denies any change in bowel movements. GENITOURINARY: She denies incontinence or problems with frequency  of urine. ENDOCRINE: She denies any heat or cold intolerance. LYMPHATIC: She denies anemia or easy bruising. MUSCULOSKELETAL: She denies any muscle or joint pain. NEUROLOGICAL: She denies any weakness, tingling or numbness. PSYCHIATRIC: Please see history of present illness.     PHYSICAL EXAMINATION:  VITAL SIGNS: Blood pressure 132/101, respirations 16, pulse 83, temperature 95.3, pulse oximetry 97% on room air.   HEENT: Normocephalic, atraumatic. Pupils are equal, round and reactive to light and accommodation. Extraocular movements are intact. Oral mucosa was moist.   NECK: The patient  refused to allow her neck to be examined.   LUNGS: Clear to auscultation bilaterally. No crackles, rales, or rhonchi.   CARDIAC: S1, S2, present. Regular rate and rhythm. No murmurs, rubs, or gallops.   ABDOMEN: The patient refused to allow her abdomen to be examined.  EXTREMITIES: Pedal pulses +2 bilaterally. No visible rashes, clubbing, or edema.   NEUROLOGIC: Cranial nerves II through XII were grossly intact. Gait was normal and steady. The patient would not allow reflexes to be tested.   LABORATORY, DIAGNOSTIC AND RADIOLOGICAL DATA:  Sodium 139, potassium 3.2, chloride 103, CO2 27, BUN 8, creatinine 1.10, glucose 121. Ethanol level less than 3.0.  Alkaline phosphatase, AST and ALT within normal limits.  Total bilirubin 1.1.  Urine tox screen negative for all substances.  TSH 39, free thyroxine 0.69.  CBC within normal limits.  Acetaminophen and salicylate level were unremarkable.   DIAGNOSES:  AXIS I: Schizophrenia, paranoid type.   AXIS II: Deferred.   AXIS III:  1. Hypertension. 2. Hypothyroidism.  3. Obesity.  AXIS IV: Severe--noncompliance with medications, unstable job history, some marital conflict.   AXIS V: Global Assessment of Functioning score at present equals 25.   ASSESSMENT AND TREATMENT RECOMMENDATIONS: Ms. Morash is a 54 year old African American female with a history of paranoid schizophrenia who has recently been noncompliant with oral antipsychotic medications. She was brought to the Emergency Room with paranoid and delusional thoughts, no current suicidal thoughts. We will admit to Inpatient Psychiatry for medication management, safety, and stabilization and place on close observation.   1. Paranoid schizophrenia: We will plan to start Risperdal 2 mg p.o. b.i.d. for now with the plan to transition to Invega injection. We will check EKG to rule out QTc prolongation as well as lipid panel in the a.m. We will also check I20 and folic acid level.   2. Hypertension: Blood pressure is currently elevated. We will plan to restart the patient on lisinopril 20 mg p.o. daily and hydrochlorothiazide 12.5 mg p.o. daily. We will monitor vital signs.  3. Hypothyroidism: TSH is currently elevated and free thyroxine is low at 0.69. We will plan to restart Synthroid at 0.112 mg p.o. daily. The patient has most likely been noncompliant with medications as an outpatient.  4. Disposition: The patient has a stable living situation with her husband. We will need to arrange for psychotropic medication management and follow-up appointment with [facility].   The risks, benefits, and alternatives to treatment were discussed with the patient, and she consented to the treatment plan. She will remain under involuntary commitment at this time.   TIME SPENT: 80 minutes  ____________________________ Steva Colder. Nicolasa Ducking, MD akk:cbb D: 10/21/2011 14:50:19 ET T: 10/22/2011 09:00:38 ET JOB#: [id number]  cc: Aarti K. Nicolasa Ducking, MD, <Dictator> Chauncey Mann MD ELECTRONICALLY SIGNED 10/22/2011 20:55

## 2014-10-14 ENCOUNTER — Ambulatory Visit (INDEPENDENT_AMBULATORY_CARE_PROVIDER_SITE_OTHER): Payer: Commercial Managed Care - HMO | Admitting: Internal Medicine

## 2014-10-14 ENCOUNTER — Encounter: Payer: Self-pay | Admitting: Internal Medicine

## 2014-10-14 ENCOUNTER — Other Ambulatory Visit (INDEPENDENT_AMBULATORY_CARE_PROVIDER_SITE_OTHER): Payer: Self-pay

## 2014-10-14 VITALS — BP 130/76 | HR 70 | Temp 98.4°F | Resp 14 | Ht 63.0 in | Wt 195.0 lb

## 2014-10-14 DIAGNOSIS — R202 Paresthesia of skin: Secondary | ICD-10-CM

## 2014-10-14 DIAGNOSIS — I10 Essential (primary) hypertension: Secondary | ICD-10-CM

## 2014-10-14 DIAGNOSIS — E039 Hypothyroidism, unspecified: Secondary | ICD-10-CM

## 2014-10-14 LAB — TSH: TSH: 0.21 u[IU]/mL — ABNORMAL LOW (ref 0.35–4.50)

## 2014-10-14 MED ORDER — LEVOTHYROXINE SODIUM 112 MCG PO TABS: 112.0000 ug | ORAL_TABLET | Freq: Every day | ORAL | Status: DC

## 2014-10-14 MED ORDER — LISINOPRIL-HYDROCHLOROTHIAZIDE 20-25 MG PO TABS: 1.0000 | ORAL_TABLET | Freq: Every day | ORAL | Status: DC

## 2014-10-14 NOTE — Patient Instructions (Signed)
We have sent in the combination blood pressure pill to make it easier. Just take 1 pill a day.   We are checking the blood work today to see if the pain in the foot is caused by a vitamin problem.   You are doing good so keep up the good work.

## 2014-10-14 NOTE — Progress Notes (Signed)
Pre visit review using our clinic review tool, if applicable. No additional management support is needed unless otherwise documented below in the visit note. 

## 2014-10-15 LAB — FOLATE: FOLATE: 7.9 ng/mL (ref >5.9)

## 2014-10-15 LAB — VITAMIN B12: Vitamin B-12: 324 pg/mL (ref 211–911)

## 2014-10-16 NOTE — Progress Notes (Signed)
   Subjective:    Patient ID: Jacqueline Berry, female    DOB: 06-03-1960, 54 y.o.   MRN: [id number]  HPI The patient is a 54 YO female who is coming in for follow up of her blood pressure. Stable for some time, still taking her medications daily. No side effects. She has also noticed new problem of rare toe tingling. She is not sure if it is related to shoes, sometimes in the day and sometimes at night. No numbness and no sores or cuts. Also can describe as slight burning. No color change, not cold. Not daily, going on for 3 months or so, not worsening.   Review of Systems  Constitutional: Negative for fever, chills, activity change, appetite change and unexpected weight change.  Respiratory: Negative for cough, chest tightness, shortness of breath and wheezing.   Cardiovascular: Negative for chest pain, palpitations and leg swelling.  Gastrointestinal: Negative for abdominal pain, diarrhea, constipation and abdominal distention.  Musculoskeletal: Negative.   Skin: Negative.   Neurological: Negative.        Tingling in toes      Objective:   Physical Exam  Constitutional: She is oriented to person, place, and time. She appears well-developed and well-nourished.  HENT:  Head: Normocephalic and atraumatic.  Eyes: EOM are normal.  Neck: Normal range of motion.  Cardiovascular: Normal rate and regular rhythm.   Pulmonary/Chest: Effort normal and breath sounds normal. No respiratory distress. She has no wheezes.  Abdominal: Soft. Bowel sounds are normal. She exhibits no distension. There is no tenderness. There is no rebound.  Musculoskeletal: She exhibits no edema.  Neurological: She is alert and oriented to person, place, and time. Coordination normal.  Skin: Skin is warm and dry.  PT and DP pulses in bilateral feet equal and 2+  Psychiatric: She has a normal mood and affect.   Filed Vitals:   10/14/14 1511  BP: 130/76  Pulse: 70  Temp: 98.4 F (36.9 C)  TempSrc: Oral  Resp: 14    Height: 5\' 3"  (1.6 m)  Weight: 195 lb (88.451 kg)  SpO2: 96%      Assessment & Plan:

## 2014-10-17 ENCOUNTER — Telehealth: Payer: Self-pay | Admitting: Internal Medicine

## 2014-10-17 DIAGNOSIS — R202 Paresthesia of skin: Secondary | ICD-10-CM | POA: Insufficient documentation

## 2014-10-17 NOTE — Assessment & Plan Note (Signed)
BP at goal on lisinopril/hctz. No indication for labs today reviewed old without complications.

## 2014-10-17 NOTE — Assessment & Plan Note (Addendum)
At this time mild and does not require treatment. Checking folate, B12, TSH for secondary cause. New problem today.

## 2014-10-17 NOTE — Telephone Encounter (Signed)
Patient is returning your call.  

## 2014-11-01 ENCOUNTER — Ambulatory Visit: Payer: Self-pay | Admitting: Internal Medicine

## 2014-12-12 NOTE — Progress Notes (Signed)
Patient received medical insurance 05/16/2014 and has not been seen in [place] in over a year. Patient's status is now inactive with [facility] effective 12/12/2014.

## 2015-04-15 ENCOUNTER — Ambulatory Visit: Payer: Self-pay | Admitting: Internal Medicine

## 2015-04-29 ENCOUNTER — Other Ambulatory Visit: Payer: Self-pay

## 2015-04-29 ENCOUNTER — Other Ambulatory Visit: Payer: Self-pay | Admitting: Internal Medicine

## 2015-04-29 DIAGNOSIS — Z1231 Encounter for screening mammogram for malignant neoplasm of breast: Secondary | ICD-10-CM

## 2015-05-09 ENCOUNTER — Ambulatory Visit
Admission: RE | Admit: 2015-05-09 | Discharge: 2015-05-09 | Disposition: A | Discharge status: Home / Self Care | Payer: Medicare Other | Source: Ambulatory Visit

## 2015-05-09 DIAGNOSIS — Z1231 Encounter for screening mammogram for malignant neoplasm of breast: Secondary | ICD-10-CM

## 2015-05-29 ENCOUNTER — Ambulatory Visit (INDEPENDENT_AMBULATORY_CARE_PROVIDER_SITE_OTHER): Payer: Medicare Other | Admitting: Internal Medicine

## 2015-05-29 ENCOUNTER — Encounter: Payer: Self-pay | Admitting: Internal Medicine

## 2015-05-29 ENCOUNTER — Other Ambulatory Visit (INDEPENDENT_AMBULATORY_CARE_PROVIDER_SITE_OTHER): Payer: Medicare Other

## 2015-05-29 VITALS — BP 106/70 | HR 79 | Temp 98.1°F | Resp 12 | Ht 63.0 in | Wt 192.0 lb

## 2015-05-29 DIAGNOSIS — E669 Obesity, unspecified: Secondary | ICD-10-CM

## 2015-05-29 DIAGNOSIS — F17200 Nicotine dependence, unspecified, uncomplicated: Secondary | ICD-10-CM

## 2015-05-29 DIAGNOSIS — I10 Essential (primary) hypertension: Secondary | ICD-10-CM | POA: Diagnosis not present

## 2015-05-29 DIAGNOSIS — E039 Hypothyroidism, unspecified: Secondary | ICD-10-CM

## 2015-05-29 DIAGNOSIS — Z0000 Encounter for general adult medical examination without abnormal findings: Secondary | ICD-10-CM

## 2015-05-29 LAB — LIPID PANEL
CHOLESTEROL: 163 mg/dL (ref 0–200)
HDL: 35.4 mg/dL — ABNORMAL LOW (ref >39.00)
LDL CALC: 106 mg/dL — AB (ref 0–99)
NonHDL: 127.18
TRIGLYCERIDES: 104 mg/dL (ref 0.0–149.0)
Total CHOL/HDL Ratio: 5
VLDL: 20.8 mg/dL (ref 0.0–40.0)

## 2015-05-29 LAB — COMPREHENSIVE METABOLIC PANEL
ALBUMIN: 4.1 g/dL (ref 3.5–5.2)
ALT: 20 U/L (ref 0–35)
AST: 18 U/L (ref 0–37)
Alkaline Phosphatase: 90 U/L (ref 39–117)
BILIRUBIN TOTAL: 0.5 mg/dL (ref 0.2–1.2)
BUN: 13 mg/dL (ref 6–23)
CALCIUM: 9.8 mg/dL (ref 8.4–10.5)
CHLORIDE: 104 meq/L (ref 96–112)
CO2: 33 mEq/L — ABNORMAL HIGH (ref 19–32)
CREATININE: 0.84 mg/dL (ref 0.40–1.20)
GFR: 90.37 mL/min (ref >60.00)
Glucose, Bld: 96 mg/dL (ref 70–99)
Potassium: 3.3 mEq/L — ABNORMAL LOW (ref 3.5–5.1)
SODIUM: 143 meq/L (ref 135–145)
TOTAL PROTEIN: 7.5 g/dL (ref 6.0–8.3)

## 2015-05-29 LAB — TSH: TSH: 1.04 u[IU]/mL (ref 0.35–4.50)

## 2015-05-29 LAB — HEMOGLOBIN A1C: HEMOGLOBIN A1C: 5.6 % (ref 4.6–6.5)

## 2015-05-29 NOTE — Assessment & Plan Note (Signed)
BP at goal on lisinopril/hctz and checking labs. Adjust as needed.

## 2015-05-29 NOTE — Assessment & Plan Note (Signed)
Checking TSH and adjust as needed. Taking 112 mcg synthroid daily.

## 2015-05-29 NOTE — Progress Notes (Signed)
Pre visit review using our clinic review tool, if applicable. No additional management support is needed unless otherwise documented below in the visit note. 

## 2015-05-29 NOTE — Assessment & Plan Note (Signed)
Counseled about weight loss, she is down 3 pounds since last visit and congratulated her on that.

## 2015-05-29 NOTE — Assessment & Plan Note (Signed)
Still smoking and does not feel able to quit at this time. Failed chantix and patches in the past.

## 2015-05-29 NOTE — Progress Notes (Signed)
   Subjective:    Patient ID: Jacqueline Berry, female    DOB: 17-Jun-1960, 55 y.o.   MRN: [id number]  HPI The patient is a 55 YO female coming in for follow up of her thyroid and her blood pressure. She is still taking her medicines and doing well. Denies chest pains, SOB, headaches. She denies constipation, diarrhea, tremors. She is still seeing psych and doing well on her meds with that. No flare in many years of that. No change to her medicines.   Review of Systems  Constitutional: Negative for fever, chills, activity change, appetite change and unexpected weight change.  Respiratory: Negative for cough, chest tightness, shortness of breath and wheezing.   Cardiovascular: Negative for chest pain, palpitations and leg swelling.  Gastrointestinal: Negative for abdominal pain, diarrhea, constipation and abdominal distention.  Musculoskeletal: Negative.   Skin: Negative.   Neurological: Negative.   Psychiatric/Behavioral: Negative.       Objective:   Physical Exam  Constitutional: She is oriented to person, place, and time. She appears well-developed and well-nourished.  HENT:  Head: Normocephalic and atraumatic.  Eyes: EOM are normal.  Neck: Normal range of motion.  Cardiovascular: Normal rate and regular rhythm.   Pulmonary/Chest: Effort normal and breath sounds normal. No respiratory distress. She has no wheezes.  Abdominal: Soft. Bowel sounds are normal. She exhibits no distension. There is no tenderness. There is no rebound.  Musculoskeletal: She exhibits no edema.  Neurological: She is alert and oriented to person, place, and time. Coordination normal.  Skin: Skin is warm and dry.  Psychiatric: She has a normal mood and affect.   Filed Vitals:   05/29/15 1026  BP: 106/70  Pulse: 79  Temp: 98.1 F (36.7 C)  TempSrc: Oral  Resp: 12  Height: 5\' 3"  (1.6 m)  Weight: 192 lb (87.091 kg)  SpO2: 96%      Assessment & Plan:

## 2015-05-29 NOTE — Patient Instructions (Addendum)
   We will check the blood work today and call you back with the results.   Come back in about 6 months. Please feel free to call us back with any questions or problems if you need [place] sooner.     Jacqueline Berry , Thank you for taking time to come for your Medicare Wellness Visit. I appreciate your ongoing commitment to your health goals. Please review the following plan we discussed and let me know if I can assist you in the future.   These are the goals we discussed:  Call for assistance; Jacqueline Berry [phone]  1. [email or web address] Put zip code in   Recommeded calling [employer] for assistance with medications based on the 3 levels given   Will discuss with Dtr; a day of the week she can take her for colonoscopy Can book 2 weeks out? If she has a date; let the office know. She can call [phone]; ask for Jacqueline Schwartz or Amy;   Smoking;  Educated to avoid secondary smoke Smoking cessation at [facility]: [phone] Classes offered a couple of times a month;  Will work with the patient as far as registration and location  1-800-QUIT-NOW ([phone]). Call call just to get information   Meds may help; chatix (Varenicline); Zyban (Bupropion SR); Nicotine Replacement (gum; lozenges; patches; etc.)  30 pack yr smoking hx: Educated regarding LDCT; To discuss with MD at next fup.    to call [place] healthcare to inquire regarding eye exam benefit  Discussed [place] offers free advance directive forms, as well as assistance in completing the forms themselves.  For assistance, contact the Spiritual Care Department at [phone], or the Clinical Social Work Department at [phone].      Goals    None      This is a list of the screening recommended for you and due dates:  Health Maintenance  Topic Date Due  .  Hepatitis C: One time screening is recommended by Center for Disease Control  (CDC) for  adults born from [birth year] through 1966.   [birth year]  . HIV  Screening  12/09/1974  . Flu Shot  05/28/2016*  . Colon Cancer Screening  05/28/2016*  . Pap Smear  05/15/2016  . Tetanus Vaccine  04/19/2017  . Mammogram  05/08/2017  *Topic was postponed. The date shown is not the original due date.

## 2015-05-29 NOTE — Progress Notes (Signed)
Subjective:   Jacqueline Berry is a 55 y.o. female who presents for Medicare Annual (Subsequent) preventive examination.  Review of Systems:  HRA assessment completed during visit;  The Patient was informed that this wellness visit is to identify risk and educate on how to reduce risk for increase disease through lifestyle changes.   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   SAFETY/ lives with spouse; lives in one level home  Falls; no falls Removal of clutter clearing paths through the home discussed  Bathroom safety; shower is separate from the tub Community safety; yes Smoke detectors; no smoke detectors ; referred to the fire department for assistance;  Firearms safety/ no firearms Driving accidents and seatbelt/ no Sun protection/ no; not out in the sun  Stressors; 1-5; about a 3 most days What can we do to bring it to a 2; problems with mother in law who lives with her and spouse. States she speaks to her in ugly fashion;  States she has access to counseling; Discussed thinking about talking to someone to develop strategies for managing this situation.  Still smoking; chantix does not work due to taking other medicine Given resources from [place] and [place] quit line  Medication review completed by Dr. Sharlet Salina today Can afford all of meds currently;   Mobilization and Functional losses in the last year: no functional losses Sleep patterns/ yes  Urinary or fecal incontinence reviewed/ no  Counseling: Colonoscopy mother had colon cancer when patient was 7;  Will discuss with the dtr who has a new job as a Technical brewer; May try to schedule a day when the daughter can take her and will  call the office to schedule if possible.   EKG: 12/2012 the patient states she was told she had 2 MI's at one time, can't state when or  who told her that; no c./o of chest pain; sob; given education on s/s of MI in women;  Hearing: 4000 hz both ears Dexa n/a  Mammagram 04/2015 neg per the patient Ophthalmology exam; no eye exam; some redness in eyes; Will call [employer] and inquire as to eye exam benefit   Immunizations Due / none today Current Care Team reviewed and updated  Cardiac Risk Factors include: hypertension;obesity (BMI >30kg/m2)    Objective:     Vitals: BP 106/70 mmHg  Pulse 79  Temp(Src) 98.1 F (36.7 C) (Oral)  Resp 12  Ht 5\' 3"  (1.6 m)  Wt 192 lb (87.091 kg)  BMI 34.02 kg/m2  SpO2 96%  Tobacco History  Smoking status  . Current Every Day Smoker -- 0.50 packs/day for 37 years  . Types: Cigarettes  Smokeless tobacco  . Not on file     Ready to quit: Not Answered Counseling given: Not Answered  Given resources  Smoking;  Educated to avoid secondary smoke Smoking cessation at [facility]: [phone] Classes offered a couple of times a month;  Will work with the patient as far as registration and location  Meds may help; chatix (Varenicline); Zyban (Bupropion SR); Nicotine Replacement (gum; lozenges; patches; etc.)  Also given number of [place] quit line   30 pack yr smoking hx: Educated regarding LDCT;  To discuss with MD at next fup.   Past Medical History  Diagnosis Date  . Bipolar disorder ([place])   . Breast cancer ([place])     with chemo - 1997  . Hypertension   . Thyroid disease   . Syphilis   . Thyroid disease    Past Surgical History  Procedure Laterality Date  . Mastectomy      left breat mastectomy 1997  . Tubal ligation      1992  . Breast biopsy  1997   Family History  Problem Relation Age of Onset  . Stroke Father   . Diabetes Brother   . Coronary artery disease Brother   . Cancer Mother   . Breast cancer Sister   . Alcohol abuse Other     . Arthritis Other   . Lung cancer Other   . Heart disease Other   . Sudden death    . Diabetes Other    History  Sexual Activity  . Sexual Activity: Yes  . Birth Control/ Protection: Surgical    Outpatient Encounter Prescriptions as of 05/29/2015  Medication Sig  . benztropine (COGENTIN) 0.5 MG tablet Take 0.5 mg by mouth daily.   . haloperidol (HALDOL) 2 MG tablet Take 2.5 mg by mouth daily.   . haloperidol decanoate (HALDOL DECANOATE) 50 MG/ML injection Inject 100 mg into the muscle every 28 (twenty-eight) days.  Marland Kitchen levothyroxine (SYNTHROID, LEVOTHROID) 112 MCG tablet Take 1 tablet (112 mcg total) by mouth daily before breakfast.  . lisinopril-hydrochlorothiazide (PRINZIDE,ZESTORETIC) 20-25 MG per tablet Take 1 tablet by mouth daily.  . [DISCONTINUED] diphenhydrAMINE (SOMINEX) 25 MG tablet Take 25 mg by mouth at bedtime as needed for sleep. Reported on 05/29/2015   No facility-administered encounter medications on file as of 05/29/2015.    Activities of Daily Living In your present state of health, do you have any difficulty performing the following activities: 05/29/2015  Hearing? N  Vision? (No Data)  Difficulty concentrating or making decisions? N  Walking or climbing stairs? N  Dressing or bathing? N  Doing errands, shopping? N  Preparing Food and eating ? N  Using the Toilet? N  In the past six months, have you accidently leaked urine? N  Do you have problems with loss of bowel control? N  Managing your Medications? N  Managing your Finances? N  Housekeeping or managing your Housekeeping? N    Patient Care Team: Hoyt Koch, MD as PCP - General (Internal Medicine) Rogers Blocker, MD (Internal Medicine)    Assessment:    Assessment   Patient presents for yearly preventative medicine examination. Medicare questionnaire screening were completed, i.e. Functional; fall risk; depression, memory loss and hearing all unremarkable today  All immunizations and health  maintenance protocols were reviewed and is up to date Education provided for laboratory screens;    Medication reconciliation, past medical history, social history, problem list and allergies were reviewed in detail with the patient  Goals were established with regard to weight loss, exercise, and diet in compliance with medications based on the patient individualized risk;   End of life planning was discussed and given resources   Exercise Activities and Dietary recommendations Current Exercise Habits:: Home exercise routine, Type of exercise: walking, Time (Minutes): > 60, Frequency (Times/Week): 2 (states she walks 5 miles on Sat and sunday), Weekly Exercise (Minutes/Week): 0  Goals    . patient     Wants to wake up and get up earlier. Will set the alarm  for 10am; try for 7 days       Fall Risk Fall Risk  05/29/2015 05/29/2015  Falls in the past year? No No   Depression Screen PHQ 2/9 Scores 05/29/2015  PHQ - 2 Score 0     Cognitive Testing No flowsheet data found.   Ad8 Score 0 ;   Immunization History  Administered Date(s) Administered  . Td 04/20/2007   Screening Tests Health Maintenance  Topic Date Due  . Hepatitis C Screening  1961-04-13  . HIV Screening  12/09/1974  . INFLUENZA VACCINE  05/28/2016 (Originally 11/18/2014)  . COLONOSCOPY  05/28/2016 (Originally 12/08/2009)  . PAP SMEAR  05/15/2016  . TETANUS/TDAP  04/19/2017  . MAMMOGRAM  05/08/2017      Plan:   Resources given  Will check with dtr regarding transporting her for colonoscopy; Will fup in 30 days; The patient to call the office if she determines a date.  During the course of the visit the patient was educated and counseled about the following appropriate screening and preventive services:   Vaccines to include Pneumoccal, Influenza, Hepatitis B, Td, Zostavax, HCV; zostavax / not 60 yet  Electrocardiogram 12/2012  Cardiovascular Disease/ deferred to md  Colorectal cancer screening/ assist  to problem solve/ family hx  Bone density screening/ not 65   Diabetes screening/ A1c 5.6  Glaucoma screening/ TBS; will fup with insurer   Mammography/ 05/12/2015 neg  Nutrition counseling / limited due to time  Patient Instructions (the written plan) was given to the patient.   Wynetta Fines, RN  05/29/2015

## 2015-05-30 ENCOUNTER — Telehealth: Payer: Self-pay

## 2015-05-30 NOTE — Telephone Encounter (Signed)
Follow up call for smoke detector; Can call te[facility] [phone]; will give her a site to go to

## 2015-05-30 NOTE — Progress Notes (Signed)
Medical screening examination/treatment/procedure(s) were performed by non-physician practitioner and as supervising physician I was immediately available for consultation/collaboration. I agree with above. Geron Mulford A Senaida Chilcote, MD 

## 2015-06-12 ENCOUNTER — Encounter: Payer: Self-pay | Admitting: Obstetrics & Gynecology

## 2015-06-30 ENCOUNTER — Encounter: Payer: Self-pay | Admitting: Obstetrics & Gynecology

## 2015-10-01 ENCOUNTER — Other Ambulatory Visit: Payer: Self-pay | Admitting: Internal Medicine

## 2015-11-20 ENCOUNTER — Encounter: Payer: Self-pay | Admitting: Internal Medicine

## 2015-12-25 ENCOUNTER — Encounter: Payer: Self-pay | Admitting: Internal Medicine

## 2016-01-06 ENCOUNTER — Encounter: Payer: Self-pay | Admitting: Internal Medicine

## 2016-02-22 ENCOUNTER — Encounter (HOSPITAL_COMMUNITY): Payer: Self-pay | Admitting: Emergency Medicine

## 2016-02-22 ENCOUNTER — Emergency Department (HOSPITAL_COMMUNITY)
Admission: EM | Admit: 2016-02-22 | Discharge: 2016-02-22 | Disposition: A | Discharge status: Home / Self Care | Payer: Medicare Other | Attending: Emergency Medicine | Admitting: Emergency Medicine

## 2016-02-22 DIAGNOSIS — Z79899 Other long term (current) drug therapy: Secondary | ICD-10-CM | POA: Insufficient documentation

## 2016-02-22 DIAGNOSIS — Z853 Personal history of malignant neoplasm of breast: Secondary | ICD-10-CM | POA: Diagnosis not present

## 2016-02-22 DIAGNOSIS — Z91040 Latex allergy status: Secondary | ICD-10-CM | POA: Diagnosis not present

## 2016-02-22 DIAGNOSIS — I10 Essential (primary) hypertension: Secondary | ICD-10-CM | POA: Diagnosis not present

## 2016-02-22 DIAGNOSIS — F17210 Nicotine dependence, cigarettes, uncomplicated: Secondary | ICD-10-CM | POA: Diagnosis not present

## 2016-02-22 DIAGNOSIS — F22 Delusional disorders: Secondary | ICD-10-CM | POA: Diagnosis not present

## 2016-02-22 DIAGNOSIS — G4700 Insomnia, unspecified: Secondary | ICD-10-CM | POA: Diagnosis not present

## 2016-02-22 LAB — URINALYSIS, ROUTINE W REFLEX MICROSCOPIC
Bilirubin Urine: NEGATIVE
GLUCOSE, UA: NEGATIVE mg/dL
Hgb urine dipstick: NEGATIVE
Ketones, ur: NEGATIVE mg/dL
LEUKOCYTES UA: NEGATIVE
NITRITE: NEGATIVE
PH: 5.5 (ref 5.0–8.0)
PROTEIN: NEGATIVE mg/dL
Specific Gravity, Urine: 1.016 (ref 1.005–1.030)

## 2016-02-22 LAB — RAPID URINE DRUG SCREEN, HOSP PERFORMED
Amphetamines: NOT DETECTED
BARBITURATES: NOT DETECTED
Benzodiazepines: NOT DETECTED
COCAINE: NOT DETECTED
OPIATES: NOT DETECTED
Tetrahydrocannabinol: NOT DETECTED

## 2016-02-22 NOTE — Discharge Instructions (Signed)
Take your medications as prescribed. Call your psychiatrist at [facility] arrange the next available appointment. If you feel that you may want to harm yourself or someone else, call 911 immediately

## 2016-02-22 NOTE — ED Triage Notes (Signed)
Pt states she feels like she's been poisoned on her job due to not being able to sleep. Denies any other associated symptoms. Pt states that her food tasted funny as well as her cigarettes tasted funny like someone was spraying something on them. Pt states she took some medicine tonight. Pt states she took 2 allergy pills and 1 tab of 2 mg of haldol. Pt wants to make a record of this so that she can turn it into the police. Pt states this has been going on for approx 7-8 months. Pt states she can in today because she is getting tired it and she is thinking about suing the company. A&O x4.

## 2016-02-22 NOTE — ED Provider Notes (Signed)
[facility] DEPT Provider Note   CSN: [id number] Arrival date & time: 02/22/16  0450     History   Chief Complaint Chief Complaint  Patient presents with  . Insomnia  . Poisoning    suspected    HPI Jacqueline Berry is a 55 y.o. female.  HPI  patient states that for the past 8 months she's had trouble sleeping when she goes to work. She feels as if someone at work his "spraying something to poison me, and I want to be tested for poison". She has gone to her supervisor who asked her to file an incident report, which she has not. She denies point to harm herself or anyone else. She admits to noncompliance with benztropine but states she is taking her other medications. No other associated symptoms and nothing makes symptoms better or worse.  Past Medical History:  Diagnosis Date  . Bipolar disorder ([place])   . Breast cancer ([place])    with chemo - 1997  . Hypertension   . Syphilis   . Thyroid disease   . Thyroid disease     Patient Active Problem List   Diagnosis Date Noted  . Paresthesia of both feet 10/17/2014  . Obesity 10/16/2009  . Hypothyroidism 07/11/2009  . Paranoid schizophrenia in remission ([place]) 06/24/2009  . TOBACCO ABUSE 06/24/2009  . Essential hypertension 06/24/2009  . HX, PERSONAL, MALIGNANCY, BREAST 12/07/2006    Past Surgical History:  Procedure Laterality Date  . BREAST BIOPSY  1997  . MASTECTOMY     left breat mastectomy 1997  . TUBAL LIGATION     1992    OB History    Gravida Para Term Preterm AB Living   5 3 3   2 3    SAB TAB Ectopic Multiple Live Births     2             Home Medications    Prior to Admission medications   Medication Sig Start Date End Date Taking? Authorizing Provider  benztropine (COGENTIN) 0.5 MG tablet Take 0.5 mg by mouth daily.    Yes Historical Provider, MD  diphenhydrAMINE (BENADRYL) 25 MG tablet Take 50 mg by mouth every 6 (six) hours as needed for sleep.    Yes Historical Provider, MD  haloperidol  (HALDOL) 2 MG tablet Take 2 mg by mouth daily.    Yes Historical Provider, MD  haloperidol decanoate (HALDOL DECANOATE) 50 MG/ML injection Inject 100 mg into the muscle every 28 (twenty-eight) days.   Yes Historical Provider, MD  levothyroxine (SYNTHROID, LEVOTHROID) 112 MCG tablet TAKE ONE TABLET BY MOUTH  DAILY BEFORE BREAKFAST 10/01/15  Yes Hoyt Koch, MD  lisinopril-hydrochlorothiazide (PRINZIDE,ZESTORETIC) 20-25 MG tablet TAKE ONE TABLET BY MOUTH  DAILY 10/01/15  Yes Hoyt Koch, MD    Family History Family History  Problem Relation Age of Onset  . Stroke Father   . Diabetes Brother   . Coronary artery disease Brother   . Cancer Mother   . Breast cancer Sister   . Alcohol abuse Other   . Arthritis Other   . Lung cancer Other   . Heart disease Other   . Sudden death    . Diabetes Other     Social History Social History  Substance Use Topics  . Smoking status: Current Every Day Smoker    Packs/day: 0.50    Years: 37.00    Types: Cigarettes  . Smokeless tobacco: Never Used  . Alcohol use No  Comment: Pt denies     Allergies   Latex   Review of Systems Review of Systems  Constitutional: Negative.   HENT: Negative.   Respiratory: Negative.   Cardiovascular: Negative.   Gastrointestinal: Negative.   Musculoskeletal: Negative.   Skin: Negative.   Neurological: Negative.   Psychiatric/Behavioral: Positive for dysphoric mood and sleep disturbance.  All other systems reviewed and are negative.    Physical Exam Updated Vital Signs BP 122/67   Pulse 63   Temp 97.9 F (36.6 C) (Oral)   Resp 16   Ht 5\' 3"  (1.6 m)   Wt 192 lb (87.1 kg)   SpO2 100%   BMI 34.01 kg/m   Physical Exam  Constitutional: She appears well-developed and well-nourished. No distress.  HENT:  Head: Normocephalic and atraumatic.  Eyes: Conjunctivae are normal. Pupils are equal, round, and reactive to light.  Neck: Neck supple. No tracheal deviation present. No  thyromegaly present.  Cardiovascular: Normal rate and regular rhythm.   No murmur heard. Pulmonary/Chest: Effort normal and breath sounds normal.  Abdominal: Soft. Bowel sounds are normal. She exhibits no distension. There is no tenderness.  Obese  Musculoskeletal: Normal range of motion. She exhibits no edema or tenderness.  Neurological: She is alert. Coordination normal.  Skin: Skin is warm and dry. No rash noted.  Psychiatric: She has a normal mood and affect.  Nursing note and vitals reviewed.    ED Treatments / Results  Labs (all labs ordered are listed, but only abnormal results are displayed) Labs Reviewed  RAPID URINE DRUG SCREEN, HOSP PERFORMED  URINALYSIS, ROUTINE W REFLEX MICROSCOPIC (NOT AT [facility])    EKG  EKG Interpretation None       Radiology No results found.  Procedures Procedures (including critical care time)  Medications Ordered in ED Medications - No data to display   Initial Impression / Assessment and Plan / ED Course  I have reviewed the triage vital signs and the nursing notes.  Pertinent labs & imaging results that were available during my care of the patient were reviewed by me and considered in my medical decision making (see chart for details).  Clinical Course   8:30 AM patient is asymptomatic. Results for orders placed or performed during the hospital encounter of 02/22/16  Rapid urine drug screen (hospital performed)  Result Value Ref Range   Opiates NONE DETECTED NONE DETECTED   Cocaine NONE DETECTED NONE DETECTED   Benzodiazepines NONE DETECTED NONE DETECTED   Amphetamines NONE DETECTED NONE DETECTED   Tetrahydrocannabinol NONE DETECTED NONE DETECTED   Barbiturates NONE DETECTED NONE DETECTED  Urinalysis, Routine w reflex microscopic (not at [facility])  Result Value Ref Range   Color, Urine YELLOW YELLOW   APPearance CLEAR CLEAR   Specific Gravity, Urine 1.016 1.005 - 1.030   pH 5.5 5.0 - 8.0   Glucose, UA NEGATIVE NEGATIVE  mg/dL   Hgb urine dipstick NEGATIVE NEGATIVE   Bilirubin Urine NEGATIVE NEGATIVE   Ketones, ur NEGATIVE NEGATIVE mg/dL   Protein, ur NEGATIVE NEGATIVE mg/dL   Nitrite NEGATIVE NEGATIVE   Leukocytes, UA NEGATIVE NEGATIVE   No results found. With history of paranoid schizophrenia I strongly doubt poisoning. Patient exhibiting symptoms of paranoia she has no suicidal or homicidal ideation. Symptoms are chronic. I suggest that the patient should follow-up with her psychiatrist Final Clinical Impressions(s) / ED Diagnoses  Diagnosis #1 insomnia #2 paranoia Final diagnoses:  None    New Prescriptions New Prescriptions   No medications on file  Orlie Dakin, MD 02/22/16 [phone]

## 2016-02-22 NOTE — ED Notes (Signed)
RN offered for pt to talk to [employer] that is here on site. Pt refused. RN informed the pt to let her know if she changes her mind.

## 2016-03-18 ENCOUNTER — Encounter (HOSPITAL_COMMUNITY): Payer: Self-pay | Admitting: *Deleted

## 2016-03-18 DIAGNOSIS — E039 Hypothyroidism, unspecified: Secondary | ICD-10-CM | POA: Insufficient documentation

## 2016-03-18 DIAGNOSIS — F17210 Nicotine dependence, cigarettes, uncomplicated: Secondary | ICD-10-CM | POA: Diagnosis not present

## 2016-03-18 DIAGNOSIS — R079 Chest pain, unspecified: Secondary | ICD-10-CM | POA: Diagnosis not present

## 2016-03-18 DIAGNOSIS — R0789 Other chest pain: Secondary | ICD-10-CM | POA: Insufficient documentation

## 2016-03-18 DIAGNOSIS — Z91040 Latex allergy status: Secondary | ICD-10-CM | POA: Insufficient documentation

## 2016-03-18 DIAGNOSIS — I10 Essential (primary) hypertension: Secondary | ICD-10-CM | POA: Diagnosis not present

## 2016-03-18 DIAGNOSIS — Z853 Personal history of malignant neoplasm of breast: Secondary | ICD-10-CM | POA: Insufficient documentation

## 2016-03-18 NOTE — ED Triage Notes (Signed)
Pt c/o L sided chest pain since last night with radiation to back and sob

## 2016-03-19 ENCOUNTER — Emergency Department (HOSPITAL_COMMUNITY)
Admission: EM | Admit: 2016-03-19 | Discharge: 2016-03-19 | Disposition: A | Discharge status: Home / Self Care | Payer: Medicare Other | Attending: Emergency Medicine | Admitting: Emergency Medicine

## 2016-03-19 ENCOUNTER — Emergency Department (HOSPITAL_COMMUNITY): Payer: Medicare Other

## 2016-03-19 DIAGNOSIS — R079 Chest pain, unspecified: Secondary | ICD-10-CM | POA: Diagnosis not present

## 2016-03-19 DIAGNOSIS — R0789 Other chest pain: Secondary | ICD-10-CM

## 2016-03-19 LAB — I-STAT TROPONIN, ED
Troponin i, poc: 0 ng/mL (ref 0.00–0.08)
Troponin i, poc: 0 ng/mL (ref 0.00–0.08)

## 2016-03-19 LAB — BASIC METABOLIC PANEL
Anion gap: 8 (ref 5–15)
BUN: 9 mg/dL (ref 6–20)
CO2: 29 mmol/L (ref 22–32)
Calcium: 9 mg/dL (ref 8.9–10.3)
Chloride: 104 mmol/L (ref 101–111)
Creatinine, Ser: 0.92 mg/dL (ref 0.44–1.00)
GFR calc Af Amer: >60 mL/min (ref >60)
GLUCOSE: 100 mg/dL — AB (ref 65–99)
POTASSIUM: 3.1 mmol/L — AB (ref 3.5–5.1)
Sodium: 141 mmol/L (ref 135–145)

## 2016-03-19 LAB — CBC
HCT: 39.9 % (ref 36.0–46.0)
Hemoglobin: 13.3 g/dL (ref 12.0–15.0)
MCH: 28.5 pg (ref 26.0–34.0)
MCHC: 33.3 g/dL (ref 30.0–36.0)
MCV: 85.4 fL (ref 78.0–100.0)
Platelets: 249 K/uL (ref 150–400)
RBC: 4.67 MIL/uL (ref 3.87–5.11)
RDW: 14.2 % (ref 11.5–15.5)
WBC: 8.3 K/uL (ref 4.0–10.5)

## 2016-03-19 NOTE — ED Provider Notes (Signed)
[facility] DEPT Provider Note   CSN: [id number] Arrival date & time: 03/18/16  2353  By signing my name below, I, Hansel Feinstein, attest that this documentation has been prepared under the direction and in the presence of Everlene Balls, MD. Electronically Signed: Hansel Feinstein, ED Scribe. 03/19/16. 3:58 AM.     History   Chief Complaint Chief Complaint  Patient presents with  . Chest Pain    HPI Jacqueline Berry is a 55 y.o. female who presents to the Emergency Department complaining of an episode of moderate, sharp left-sided CP with radiation to the mid back that began last night at 11 pm at rest. Per pt, her pain has currently resolved. Pt reports similar episodes of CP for the last 2 nights in a row, but none prior to that. Pt states the episode this evening was more severe and this brought her to the ED. She reports all episodes resolved on their own and she did not try any treatments. No worsening factors noted. No h/o PE/DVT, MI, heart burn, gastritis. She denies SOB, leg swelling, diaphoresis, nausea, vomiting, rhinorrhea, fever, recent illness.   The history is provided by the patient. No language interpreter was used.    Past Medical History:  Diagnosis Date  . Bipolar disorder ([place])   . Breast cancer ([place])    with chemo - 1997  . Hypertension   . Syphilis   . Thyroid disease   . Thyroid disease     Patient Active Problem List   Diagnosis Date Noted  . Paresthesia of both feet 10/17/2014  . Obesity 10/16/2009  . Hypothyroidism 07/11/2009  . Paranoid schizophrenia in remission ([place]) 06/24/2009  . TOBACCO ABUSE 06/24/2009  . Essential hypertension 06/24/2009  . HX, PERSONAL, MALIGNANCY, BREAST 12/07/2006    Past Surgical History:  Procedure Laterality Date  . BREAST BIOPSY  1997  . MASTECTOMY     left breat mastectomy 1997  . TUBAL LIGATION     1992    OB History    Gravida Para Term Preterm AB Living   5 3 3   2 3    SAB TAB Ectopic Multiple Live Births     2             Home Medications    Prior to Admission medications   Medication Sig Start Date End Date Taking? Authorizing Provider  benztropine (COGENTIN) 0.5 MG tablet Take 0.5 mg by mouth daily.     Historical Provider, MD  diphenhydrAMINE (BENADRYL) 25 MG tablet Take 50 mg by mouth every 6 (six) hours as needed for sleep.     Historical Provider, MD  haloperidol (HALDOL) 2 MG tablet Take 2 mg by mouth daily.     Historical Provider, MD  haloperidol decanoate (HALDOL DECANOATE) 50 MG/ML injection Inject 100 mg into the muscle every 28 (twenty-eight) days.    Historical Provider, MD  levothyroxine (SYNTHROID, LEVOTHROID) 112 MCG tablet TAKE ONE TABLET BY MOUTH  DAILY BEFORE BREAKFAST 10/01/15   Hoyt Koch, MD  lisinopril-hydrochlorothiazide [facility]) 20-25 MG tablet TAKE ONE TABLET BY MOUTH  DAILY 10/01/15   Hoyt Koch, MD    Family History Family History  Problem Relation Age of Onset  . Stroke Father   . Diabetes Brother   . Coronary artery disease Brother   . Cancer Mother   . Breast cancer Sister   . Alcohol abuse Other   . Arthritis Other   . Lung cancer Other   . Heart disease  Other   . Sudden death    . Diabetes Other     Social History Social History  Substance Use Topics  . Smoking status: Current Every Day Smoker    Packs/day: 0.50    Years: 37.00    Types: Cigarettes  . Smokeless tobacco: Never Used  . Alcohol use No     Comment: Pt denies     Allergies   Latex   Review of Systems Review of Systems A complete 10 system review of systems was obtained and all systems are negative except as noted in the HPI and PMH.    Physical Exam Updated Vital Signs BP 137/89   Pulse (!) 57   Temp 97.8 F (36.6 C) (Oral)   Resp 20   SpO2 100%   Physical Exam  Constitutional: She is oriented to person, place, and time. She appears well-developed and well-nourished. No distress.  HENT:  Head: Normocephalic and atraumatic.    Nose: Nose normal.  Mouth/Throat: Oropharynx is clear and moist. No oropharyngeal exudate.  Eyes: Conjunctivae and EOM are normal. Pupils are equal, round, and reactive to light. No scleral icterus.  Neck: Normal range of motion. Neck supple. No JVD present. No tracheal deviation present. No thyromegaly present.  Cardiovascular: Normal rate, regular rhythm and normal heart sounds.  Exam reveals no gallop and no friction rub.   No murmur heard. Pulmonary/Chest: Effort normal and breath sounds normal. No respiratory distress. She has no wheezes. She exhibits no tenderness.  Abdominal: Soft. Bowel sounds are normal. She exhibits no distension and no mass. There is no tenderness. There is no rebound and no guarding.  Musculoskeletal: Normal range of motion. She exhibits no edema or tenderness.  Lymphadenopathy:    She has no cervical adenopathy.  Neurological: She is alert and oriented to person, place, and time. No cranial nerve deficit. She exhibits normal muscle tone.  Skin: Skin is warm and dry. No rash noted. No erythema. No pallor.  Nursing note and vitals reviewed.    ED Treatments / Results   DIAGNOSTIC STUDIES: Oxygen Saturation is 100% on RA, normal by my interpretation.    COORDINATION OF CARE: 3:56 AM Discussed treatment plan with pt at bedside which includes lab work, CXR and pt agreed to plan.    Labs (all labs ordered are listed, but only abnormal results are displayed) Labs Reviewed  BASIC METABOLIC PANEL - Abnormal; Notable for the following:       Result Value   Potassium 3.1 (*)    Glucose, Bld 100 (*)    All other components within normal limits  CBC  I-STAT TROPOININ, ED  I-STAT TROPOININ, ED    EKG  EKG Interpretation  Date/Time:  Thursday March 18 2016 23:55:53 EST Ventricular Rate:  70 PR Interval:  174 QRS Duration: 94 QT Interval:  380 QTC Calculation: 410 R Axis:   -63 Text Interpretation:  Normal sinus rhythm Left axis deviation Inferior  infarct , age undetermined Anterolateral infarct , age undetermined Abnormal ECG No significant change since last tracing Confirmed by [facility] Octave [phone]) on 03/19/2016 4:16:39 AM       Radiology Dg Chest 2 View  Result Date: 03/19/2016 CLINICAL DATA:  Left-sided chest pain EXAM: CHEST  2 VIEW COMPARISON:  Chest radiograph 03/17/2008 FINDINGS: Unchanged left axillary surgical clips. Status post left mastectomy. Unchanged, normal cardiomediastinal contours. No focal airspace consolidation or pulmonary edema. No pneumothorax or pleural effusion. IMPRESSION: No active cardiopulmonary disease. Electronically Signed   By:  Ulyses Jarred M.D.   On: 03/19/2016 00:58    Procedures Procedures (including critical care time)  Medications Ordered in ED Medications - No data to display   Initial Impression / Assessment and Plan / ED Course  I have reviewed the triage vital signs and the nursing notes.  Pertinent labs & imaging results that were available during my care of the patient were reviewed by me and considered in my medical decision making (see chart for details).  Clinical Course     Patient presents to the ED for CP .  She is low risk for ACS.  HEART score is less than 3.  Initial troponin and EKG do not show ischemia, will repeat in 3 hours.  She is currently asymptomatic with no CP   4:50 AM Repeat troponin is negative. EKG unchanged.  PCP fu advised within 3 days.  HEART score remains less than 3. She appears well and in NAD. VS remain within her normal limits and she is safe for DC.  Final Clinical Impressions(s) / ED Diagnoses   Final diagnoses:  None    New Prescriptions New Prescriptions   No medications on file      I personally performed the services described in this documentation, which was scribed in my presence. The recorded information has been reviewed and is accurate.      Everlene Balls, MD 03/19/16 [phone]

## 2016-04-15 ENCOUNTER — Other Ambulatory Visit: Payer: Self-pay | Admitting: Internal Medicine

## 2016-04-15 DIAGNOSIS — Z1231 Encounter for screening mammogram for malignant neoplasm of breast: Secondary | ICD-10-CM

## 2016-05-14 ENCOUNTER — Ambulatory Visit
Admission: RE | Admit: 2016-05-14 | Discharge: 2016-05-14 | Disposition: A | Discharge status: Home / Self Care | Payer: Medicare Other | Source: Ambulatory Visit | Attending: Internal Medicine | Admitting: Internal Medicine

## 2016-05-14 DIAGNOSIS — Z1231 Encounter for screening mammogram for malignant neoplasm of breast: Secondary | ICD-10-CM | POA: Diagnosis not present

## 2016-09-14 ENCOUNTER — Encounter: Payer: Self-pay | Admitting: Internal Medicine

## 2016-09-23 ENCOUNTER — Encounter: Payer: Self-pay | Admitting: Internal Medicine

## 2016-09-28 ENCOUNTER — Ambulatory Visit (INDEPENDENT_AMBULATORY_CARE_PROVIDER_SITE_OTHER): Payer: Medicare Other | Admitting: Nurse Practitioner

## 2016-09-28 ENCOUNTER — Other Ambulatory Visit (INDEPENDENT_AMBULATORY_CARE_PROVIDER_SITE_OTHER): Payer: Medicare Other

## 2016-09-28 ENCOUNTER — Encounter: Payer: Self-pay | Admitting: Internal Medicine

## 2016-09-28 ENCOUNTER — Encounter: Payer: Self-pay | Admitting: Nurse Practitioner

## 2016-09-28 VITALS — BP 106/74 | HR 68 | Temp 98.3°F | Ht 63.0 in | Wt 192.0 lb

## 2016-09-28 DIAGNOSIS — Z0000 Encounter for general adult medical examination without abnormal findings: Secondary | ICD-10-CM | POA: Diagnosis not present

## 2016-09-28 DIAGNOSIS — R739 Hyperglycemia, unspecified: Secondary | ICD-10-CM | POA: Diagnosis not present

## 2016-09-28 DIAGNOSIS — I10 Essential (primary) hypertension: Secondary | ICD-10-CM

## 2016-09-28 DIAGNOSIS — Z13220 Encounter for screening for lipoid disorders: Secondary | ICD-10-CM

## 2016-09-28 DIAGNOSIS — E039 Hypothyroidism, unspecified: Secondary | ICD-10-CM

## 2016-09-28 DIAGNOSIS — Z136 Encounter for screening for cardiovascular disorders: Secondary | ICD-10-CM | POA: Diagnosis not present

## 2016-09-28 LAB — LIPID PANEL
CHOL/HDL RATIO: 5
CHOLESTEROL: 148 mg/dL (ref 0–200)
HDL: 32.4 mg/dL — ABNORMAL LOW (ref >39.00)
NONHDL: 115.62
TRIGLYCERIDES: 218 mg/dL — AB (ref 0.0–149.0)
VLDL: 43.6 mg/dL — AB (ref 0.0–40.0)

## 2016-09-28 LAB — CBC
HEMATOCRIT: 40.6 % (ref 36.0–46.0)
Hemoglobin: 13.3 g/dL (ref 12.0–15.0)
MCHC: 32.7 g/dL (ref 30.0–36.0)
MCV: 86.1 fl (ref 78.0–100.0)
PLATELETS: 253 10*3/uL (ref 150.0–400.0)
RBC: 4.72 Mil/uL (ref 3.87–5.11)
RDW: 14.3 % (ref 11.5–15.5)
WBC: 7.3 10*3/uL (ref 4.0–10.5)

## 2016-09-28 LAB — HEMOGLOBIN A1C: Hgb A1c MFr Bld: 5.8 % (ref 4.6–6.5)

## 2016-09-28 LAB — TSH: TSH: 0.23 u[IU]/mL — ABNORMAL LOW (ref 0.35–4.50)

## 2016-09-28 LAB — LDL CHOLESTEROL, DIRECT: LDL DIRECT: 91 mg/dL

## 2016-09-28 LAB — T4, FREE: FREE T4: 1.06 ng/dL (ref 0.60–1.60)

## 2016-09-28 NOTE — Progress Notes (Signed)
Subjective:  Patient ID: Jacqueline Berry, female    DOB: 14-Jan-1961  Age: 56 y.o. MRN: [id number]  CC: Annual Exam (CPE)   HPI Denies any acute complains. Needs medications lisinopril and levothyroxine refilled.  HTN: Controlled with lisinopril HCTZ.  Hypothyroidism: Stable to levothyroxine.  Schizophrenia: managed by [facility] mental [facility]. Current use of haldol and cogentin. No change in medications in last 5months. Denies any manic episodes or hallucinations.  Depression screen PHQ 2/9 09/28/2016  Decreased Interest 0  Down, Depressed, Hopeless 0  PHQ - 2 Score 0   Past Medical History:  Diagnosis Date  . Bipolar disorder ([place])   . Breast cancer ([place])    with chemo - 1997  . Hypertension   . Syphilis   . Thyroid disease   . Thyroid disease    Outpatient Medications Prior to Visit  Medication Sig Dispense Refill  . benztropine (COGENTIN) 0.5 MG tablet Take 0.5 mg by mouth daily.     . diphenhydrAMINE (BENADRYL) 25 MG tablet Take 50 mg by mouth every 6 (six) hours as needed for sleep.     . haloperidol (HALDOL) 2 MG tablet Take 2 mg by mouth daily.     . haloperidol decanoate (HALDOL DECANOATE) 50 MG/ML injection Inject 100 mg into the muscle every 28 (twenty-eight) days.    Marland Kitchen levothyroxine (SYNTHROID, LEVOTHROID) 112 MCG tablet TAKE ONE TABLET BY MOUTH  DAILY BEFORE BREAKFAST 90 tablet 3  . lisinopril-hydrochlorothiazide (PRINZIDE,ZESTORETIC) 20-25 MG tablet TAKE ONE TABLET BY MOUTH  DAILY 90 tablet 3   No facility-administered medications prior to visit.     ROS Review of Systems  Constitutional: Negative.   HENT: Negative.   Eyes: Negative for blurred vision.  Respiratory: Negative.   Cardiovascular: Negative.   Gastrointestinal: Negative.   Genitourinary: Negative.   Musculoskeletal: Negative.   Skin: Negative.   Neurological: Negative for dizziness, tingling, tremors, sensory change, speech change, loss of consciousness and headaches.    Endo/Heme/Allergies: Negative for environmental allergies and polydipsia.  Psychiatric/Behavioral: Negative for depression and memory loss. The patient is not nervous/anxious and does not have insomnia.      Objective:  BP 106/74   Pulse 68   Temp 98.3 F (36.8 C)   Ht 5\' 3"  (1.6 m)   Wt 192 lb (87.1 kg)   SpO2 98%   BMI 34.01 kg/m   BP Readings from Last 3 Encounters:  09/28/16 106/74  03/19/16 137/89  02/22/16 131/91    Wt Readings from Last 3 Encounters:  09/28/16 192 lb (87.1 kg)  02/22/16 192 lb (87.1 kg)  05/29/15 192 lb (87.1 kg)    Physical Exam  Constitutional: She is oriented to person, place, and time. No distress.  Declined breast and pelvic exam  HENT:  Right Ear: External ear normal.  Left Ear: External ear normal.  Nose: Nose normal.  Mouth/Throat: No oropharyngeal exudate.  Eyes: No scleral icterus.  Neck: Normal range of motion. Neck supple.  Cardiovascular: Normal rate, regular rhythm and normal heart sounds.   Pulmonary/Chest: Effort normal and breath sounds normal. No respiratory distress.  Abdominal: Soft. She exhibits no distension.  Musculoskeletal: Normal range of motion. She exhibits no edema.  Lymphadenopathy:    She has no cervical adenopathy.  Neurological: She is alert and oriented to person, place, and time.  Skin: Skin is warm and dry.  Psychiatric: She has a normal mood and affect. Her behavior is normal.    Lab Results  Component Value Date  WBC 7.3 09/28/2016   HGB 13.3 09/28/2016   HCT 40.6 09/28/2016   PLT 253.0 09/28/2016   GLUCOSE 100 (H) 03/18/2016   CHOL 148 09/28/2016   TRIG 218.0 (H) 09/28/2016   HDL 32.40 (L) 09/28/2016   LDLDIRECT 91.0 09/28/2016   LDLCALC 106 (H) 05/29/2015   ALT 20 05/29/2015   AST 18 05/29/2015   NA 141 03/18/2016   K 3.1 (L) 03/18/2016   CL 104 03/18/2016   CREATININE 0.92 03/18/2016   BUN 9 03/18/2016   CO2 29 03/18/2016   TSH 0.23 (L) 09/28/2016   HGBA1C 5.8 09/28/2016    MICROALBUR 3.59 (H) 05/06/2010    Mm Digital Screening Unilat R  Result Date: 05/14/2016 CLINICAL DATA:  Screening. History of right breast cancer in 1997 status post mastectomy. EXAM: DIGITAL SCREENING UNILATERAL RIGHT MAMMOGRAM WITH CAD COMPARISON:  Previous exam(s). ACR Breast Density Category b: There are scattered areas of fibroglandular density. FINDINGS: There are no findings suspicious for malignancy within the left breast. Patient is status post right mastectomy. Images were processed with CAD. IMPRESSION: No mammographic evidence of malignancy. A result letter of this screening mammogram will be mailed directly to the patient. RECOMMENDATION: Screening mammogram in one year. (Code:[id number]-01Y) BI-RADS CATEGORY  1: Negative. Electronically Signed   By: Franki Cabot M.D.   On: 05/14/2016 13:20    Assessment & Plan:   Jacqueline Berry was seen today for annual exam.  Diagnoses and all orders for this visit:  Encounter for preventative adult health care examination -     Comprehensive metabolic panel -     CBC; Future -     Lipid panel; Future  Hypothyroidism, unspecified type -     TSH; Future -     T4, free; Future  Essential hypertension  Hyperglycemia -     Hemoglobin A1c; Future  Encounter for lipid screening for cardiovascular disease -     Lipid panel; Future   I am having Jacqueline Berry maintain her haloperidol decanoate, benztropine, haloperidol, diphenhydrAMINE, and haloperidol decanoate.  Meds ordered this encounter  Medications  . haloperidol decanoate (HALDOL DECANOATE) 100 MG/ML injection    Follow-up: Return in about 6 months (around 03/30/2017) for HTN and hypothyroidism.  Wilfred Lacy, NP

## 2016-09-28 NOTE — Patient Instructions (Signed)
Go to basement for blood draw. Will review labs prior to refilling medications (lisinopril and levothyroxine).  Health Maintenance, Female Adopting a healthy lifestyle and getting preventive care can go a long way to promote health and wellness. Talk with your health care provider about what schedule of regular examinations is right for you. This is a good chance for you to check in with your provider about disease prevention and staying healthy. In between checkups, there are plenty of things you can do on your own. Experts have done a lot of research about which lifestyle changes and preventive measures are most likely to keep you healthy. Ask your health care provider for more information. Weight and diet Eat a healthy diet  Be sure to include plenty of vegetables, fruits, low-fat dairy products, and lean protein.  Do not eat a lot of foods high in solid fats, added sugars, or salt.  Get regular exercise. This is one of the most important things you can do for your health. ? Most adults should exercise for at least 150 minutes each week. The exercise should increase your heart rate and make you sweat (moderate-intensity exercise). ? Most adults should also do strengthening exercises at least twice a week. This is in addition to the moderate-intensity exercise.  Maintain a healthy weight  Body mass index (BMI) is a measurement that can be used to identify possible weight problems. It estimates body fat based on height and weight. Your health care provider can help determine your BMI and help you achieve or maintain a healthy weight.  For females 57 years of age and older: ? A BMI below 18.5 is considered underweight. ? A BMI of 18.5 to 24.9 is normal. ? A BMI of 25 to 29.9 is considered overweight. ? A BMI of 30 and above is considered obese.  Watch levels of cholesterol and blood lipids  You should start having your blood tested for lipids and cholesterol at 57 years of age, then have  this test every 5 years.  You may need to have your cholesterol levels checked more often if: ? Your lipid or cholesterol levels are high. ? You are older than 57 years of age. ? You are at high risk for heart disease.  Cancer screening Lung Cancer  Lung cancer screening is recommended for adults 37-55 years old who are at high risk for lung cancer because of a history of smoking.  A yearly low-dose CT scan of the lungs is recommended for people who: ? Currently smoke. ? Have quit within the past 15 years. ? Have at least a 30-pack-year history of smoking. A pack year is smoking an average of one pack of cigarettes a day for 1 year.  Yearly screening should continue until it has been 15 years since you quit.  Yearly screening should stop if you develop a health problem that would prevent you from having lung cancer treatment.  Breast Cancer  Practice breast self-awareness. This means understanding how your breasts normally appear and feel.  It also means doing regular breast self-exams. Let your health care provider know about any changes, no matter how small.  If you are in your 20s or 30s, you should have a clinical breast exam (CBE) by a health care provider every 1-3 years as part of a regular health exam.  If you are 57 or older, have a CBE every year. Also consider having a breast X-ray (mammogram) every year.  If you have a family history of breast  cancer, talk to your health care provider about genetic screening.  If you are at high risk for breast cancer, talk to your health care provider about having an MRI and a mammogram every year.  Breast cancer gene (BRCA) assessment is recommended for women who have family members with BRCA-related cancers. BRCA-related cancers include: ? Breast. ? Ovarian. ? Tubal. ? Peritoneal cancers.  Results of the assessment will determine the need for genetic counseling and BRCA1 and BRCA2 testing.  Cervical Cancer Your health care  provider may recommend that you be screened regularly for cancer of the pelvic organs (ovaries, uterus, and vagina). This screening involves a pelvic examination, including checking for microscopic changes to the surface of your cervix (Pap test). You may be encouraged to have this screening done every 3 years, beginning at age 57.  For women ages 11-65, health care providers may recommend pelvic exams and Pap testing every 3 years, or they may recommend the Pap and pelvic exam, combined with testing for human papilloma virus (HPV), every 5 years. Some types of HPV increase your risk of cervical cancer. Testing for HPV may also be done on women of any age with unclear Pap test results.  Other health care providers may not recommend any screening for nonpregnant women who are considered low risk for pelvic cancer and who do not have symptoms. Ask your health care provider if a screening pelvic exam is right for you.  If you have had past treatment for cervical cancer or a condition that could lead to cancer, you need Pap tests and screening for cancer for at least 20 years after your treatment. If Pap tests have been discontinued, your risk factors (such as having a new sexual partner) need to be reassessed to determine if screening should resume. Some women have medical problems that increase the chance of getting cervical cancer. In these cases, your health care provider may recommend more frequent screening and Pap tests.  Colorectal Cancer  This type of cancer can be detected and often prevented.  Routine colorectal cancer screening usually begins at 57 years of age and continues through 57 years of age.  Your health care provider may recommend screening at an earlier age if you have risk factors for colon cancer.  Your health care provider may also recommend using home test kits to check for hidden blood in the stool.  A small camera at the end of a tube can be used to examine your colon  directly (sigmoidoscopy or colonoscopy). This is done to check for the earliest forms of colorectal cancer.  Routine screening usually begins at age 57.  Direct examination of the colon should be repeated every 5-10 years through 57 years of age. However, you may need to be screened more often if early forms of precancerous polyps or small growths are found.  Skin Cancer  Check your skin from head to toe regularly.  Tell your health care provider about any new moles or changes in moles, especially if there is a change in a mole's shape or color.  Also tell your health care provider if you have a mole that is larger than the size of a pencil eraser.  Always use sunscreen. Apply sunscreen liberally and repeatedly throughout the day.  Protect yourself by wearing long sleeves, pants, a wide-brimmed hat, and sunglasses whenever you are outside.  Heart disease, diabetes, and high blood pressure  High blood pressure causes heart disease and increases the risk of stroke. High blood pressure  is more likely to develop in: ? People who have blood pressure in the high end of the normal range (130-139/85-89 mm Hg). ? People who are overweight or obese. ? People who are African American.  If you are 32-59 years of age, have your blood pressure checked every 3-5 years. If you are 25 years of age or older, have your blood pressure checked every year. You should have your blood pressure measured twice-once when you are at a hospital or clinic, and once when you are not at a hospital or clinic. Record the average of the two measurements. To check your blood pressure when you are not at a hospital or clinic, you can use: ? An automated blood pressure machine at a pharmacy. ? A home blood pressure monitor.  If you are between 39 years and 53 years old, ask your health care provider if you should take aspirin to prevent strokes.  Have regular diabetes screenings. This involves taking a blood sample to  check your fasting blood sugar level. ? If you are at a normal weight and have a low risk for diabetes, have this test once every three years after 57 years of age. ? If you are overweight and have a high risk for diabetes, consider being tested at a younger age or more often. Preventing infection Hepatitis B  If you have a higher risk for hepatitis B, you should be screened for this virus. You are considered at high risk for hepatitis B if: ? You were born in a country where hepatitis B is common. Ask your health care provider which countries are considered high risk. ? Your parents were born in a high-risk country, and you have not been immunized against hepatitis B (hepatitis B vaccine). ? You have HIV or AIDS. ? You use needles to inject street drugs. ? You live with someone who has hepatitis B. ? You have had sex with someone who has hepatitis B. ? You get hemodialysis treatment. ? You take certain medicines for conditions, including cancer, organ transplantation, and autoimmune conditions.  Hepatitis C  Blood testing is recommended for: ? Everyone born from [birth year] through 1966. ? Anyone with known risk factors for hepatitis C.  Sexually transmitted infections (STIs)  You should be screened for sexually transmitted infections (STIs) including gonorrhea and chlamydia if: ? You are sexually active and are younger than 57 years of age. ? You are older than 57 years of age and your health care provider tells you that you are at risk for this type of infection. ? Your sexual activity has changed since you were last screened and you are at an increased risk for chlamydia or gonorrhea. Ask your health care provider if you are at risk.  If you do not have HIV, but are at risk, it may be recommended that you take a prescription medicine daily to prevent HIV infection. This is called pre-exposure prophylaxis (PrEP). You are considered at risk if: ? You are sexually active and do not regularly  use condoms or know the HIV status of your partner(s). ? You take drugs by injection. ? You are sexually active with a partner who has HIV.  Talk with your health care provider about whether you are at high risk of being infected with HIV. If you choose to begin PrEP, you should first be tested for HIV. You should then be tested every 3 months for as long as you are taking PrEP. Pregnancy  If you are premenopausal and  you may become pregnant, ask your health care provider about preconception counseling.  If you may become pregnant, take 400 to 800 micrograms (mcg) of folic acid every day.  If you want to prevent pregnancy, talk to your health care provider about birth control (contraception). Osteoporosis and menopause  Osteoporosis is a disease in which the bones lose minerals and strength with aging. This can result in serious bone fractures. Your risk for osteoporosis can be identified using a bone density scan.  If you are 26 years of age or older, or if you are at risk for osteoporosis and fractures, ask your health care provider if you should be screened.  Ask your health care provider whether you should take a calcium or vitamin D supplement to lower your risk for osteoporosis.  Menopause may have certain physical symptoms and risks.  Hormone replacement therapy may reduce some of these symptoms and risks. Talk to your health care provider about whether hormone replacement therapy is right for you. Follow these instructions at home:  Schedule regular health, dental, and eye exams.  Stay current with your immunizations.  Do not use any tobacco products including cigarettes, chewing tobacco, or electronic cigarettes.  If you are pregnant, do not drink alcohol.  If you are breastfeeding, limit how much and how often you drink alcohol.  Limit alcohol intake to no more than 1 drink per day for nonpregnant women. One drink equals 12 ounces of beer, 5 ounces of wine, or 1 ounces  of hard liquor.  Do not use street drugs.  Do not share needles.  Ask your health care provider for help if you need support or information about quitting drugs.  Tell your health care provider if you often feel depressed.  Tell your health care provider if you have ever been abused or do not feel safe at home. This information is not intended to replace advice given to you by your health care provider. Make sure you discuss any questions you have with your health care provider. Document Released: 10/19/2010 Document Revised: 09/11/2015 Document Reviewed: 01/07/2015 Elsevier Interactive Patient Education  [employer].

## 2016-09-30 ENCOUNTER — Other Ambulatory Visit: Payer: Self-pay | Admitting: Internal Medicine

## 2016-10-01 ENCOUNTER — Telehealth: Payer: Self-pay | Admitting: Internal Medicine

## 2016-10-01 DIAGNOSIS — I10 Essential (primary) hypertension: Secondary | ICD-10-CM

## 2016-10-01 DIAGNOSIS — E039 Hypothyroidism, unspecified: Secondary | ICD-10-CM

## 2016-10-01 MED ORDER — LEVOTHYROXINE SODIUM 100 MCG PO TABS: 100.0000 ug | ORAL_TABLET | Freq: Every day | ORAL | 1 refills | Status: DC

## 2016-10-01 MED ORDER — LISINOPRIL-HYDROCHLOROTHIAZIDE 20-25 MG PO TABS: 1.0000 | ORAL_TABLET | Freq: Every day | ORAL | 1 refills | Status: DC

## 2016-10-01 NOTE — Telephone Encounter (Signed)
Please advise, okey to send in Levothyroxine and any refill? Pt request to send to [facility] on [address] ([facility] [address]--need to resend to [facility] on [address])

## 2016-10-01 NOTE — Telephone Encounter (Signed)
Pt states [facility] [address] does not have her rx sent yesterday. Can you resend? levothyroxine (SYNTHROID, LEVOTHROID) 112 MCG tablet  lisinopril-hydrochlorothiazide (PRINZIDE,ZESTORETIC) 20-25 MG tablet  Pt states she is out and will have a headache if she does not have. Thank you!

## 2016-10-01 NOTE — Telephone Encounter (Signed)
Prescriptions sent

## 2016-10-04 ENCOUNTER — Telehealth: Payer: Self-pay | Admitting: Nurse Practitioner

## 2016-10-04 NOTE — Telephone Encounter (Signed)
Pt returned your call regarding her lab results. I gave her [place]'s response. She expressed understanding and did not have any questions at this time.  °

## 2016-10-08 ENCOUNTER — Other Ambulatory Visit: Payer: Self-pay | Admitting: Internal Medicine

## 2016-10-08 NOTE — Telephone Encounter (Signed)
Pt left msg on triage stating her pharmacy did not received her Levothyroxine. Per chart [facility]. Pt has several pharmacy [facility] [address] spoke w/tech the rx for [facility]othyroxine [facility]. She state pt did pick -up her Lisinopril there but thyroid med went to [facility] location. Called pt back no answer LMOM thyroid med was sent to [facility] on [address]. Updated pharmacy list only need 1 local and mail service on pharmacy list.../lmb

## 2016-10-12 ENCOUNTER — Telehealth: Payer: Self-pay | Admitting: Internal Medicine

## 2016-10-12 DIAGNOSIS — N6310 Unspecified lump in the right breast, unspecified quadrant: Secondary | ICD-10-CM

## 2016-10-12 NOTE — Telephone Encounter (Signed)
Pease advise.

## 2016-10-12 NOTE — Telephone Encounter (Signed)
Pt called in and said that she felt a lump in her breast and would like a referral to get another mammogram.  Her last one was in [place] .  Right breast   [facility]

## 2016-10-13 NOTE — Telephone Encounter (Signed)
Spoke to [facility] and they have called pt and left msg for pt to call them back.

## 2016-10-13 NOTE — Telephone Encounter (Signed)
This was ordered stat - please let [facility]  / Cecille Rubin know this was ordered stat so they can help facilitate.

## 2016-10-18 ENCOUNTER — Ambulatory Visit
Admission: RE | Admit: 2016-10-18 | Discharge: 2016-10-18 | Disposition: A | Discharge status: Home / Self Care | Payer: Medicare Other | Source: Ambulatory Visit | Attending: Internal Medicine | Admitting: Internal Medicine

## 2016-10-18 DIAGNOSIS — N6310 Unspecified lump in the right breast, unspecified quadrant: Secondary | ICD-10-CM

## 2016-10-18 DIAGNOSIS — R928 Other abnormal and inconclusive findings on diagnostic imaging of breast: Secondary | ICD-10-CM | POA: Diagnosis not present

## 2016-10-18 DIAGNOSIS — N644 Mastodynia: Secondary | ICD-10-CM | POA: Diagnosis not present

## 2017-02-01 ENCOUNTER — Emergency Department (HOSPITAL_COMMUNITY)
Admission: EM | Admit: 2017-02-01 | Discharge: 2017-02-01 | Disposition: A | Discharge status: Home / Self Care | Payer: Medicare Other | Attending: Emergency Medicine | Admitting: Emergency Medicine

## 2017-02-01 ENCOUNTER — Encounter (HOSPITAL_COMMUNITY): Payer: Self-pay

## 2017-02-01 DIAGNOSIS — Z853 Personal history of malignant neoplasm of breast: Secondary | ICD-10-CM | POA: Diagnosis not present

## 2017-02-01 DIAGNOSIS — S29011A Strain of muscle and tendon of front wall of thorax, initial encounter: Secondary | ICD-10-CM | POA: Insufficient documentation

## 2017-02-01 DIAGNOSIS — I10 Essential (primary) hypertension: Secondary | ICD-10-CM | POA: Diagnosis not present

## 2017-02-01 DIAGNOSIS — Z91040 Latex allergy status: Secondary | ICD-10-CM | POA: Insufficient documentation

## 2017-02-01 DIAGNOSIS — Y999 Unspecified external cause status: Secondary | ICD-10-CM | POA: Diagnosis not present

## 2017-02-01 DIAGNOSIS — R0789 Other chest pain: Secondary | ICD-10-CM | POA: Diagnosis not present

## 2017-02-01 DIAGNOSIS — Y9389 Activity, other specified: Secondary | ICD-10-CM | POA: Insufficient documentation

## 2017-02-01 DIAGNOSIS — S299XXA Unspecified injury of thorax, initial encounter: Secondary | ICD-10-CM | POA: Diagnosis present

## 2017-02-01 DIAGNOSIS — E039 Hypothyroidism, unspecified: Secondary | ICD-10-CM | POA: Insufficient documentation

## 2017-02-01 DIAGNOSIS — Z9012 Acquired absence of left breast and nipple: Secondary | ICD-10-CM | POA: Diagnosis not present

## 2017-02-01 DIAGNOSIS — X500XXA Overexertion from strenuous movement or load, initial encounter: Secondary | ICD-10-CM | POA: Diagnosis not present

## 2017-02-01 DIAGNOSIS — Y929 Unspecified place or not applicable: Secondary | ICD-10-CM | POA: Insufficient documentation

## 2017-02-01 DIAGNOSIS — Z79899 Other long term (current) drug therapy: Secondary | ICD-10-CM | POA: Insufficient documentation

## 2017-02-01 DIAGNOSIS — T148XXA Other injury of unspecified body region, initial encounter: Secondary | ICD-10-CM

## 2017-02-01 DIAGNOSIS — F17210 Nicotine dependence, cigarettes, uncomplicated: Secondary | ICD-10-CM | POA: Diagnosis not present

## 2017-02-01 DIAGNOSIS — S46911A Strain of unspecified muscle, fascia and tendon at shoulder and upper arm level, right arm, initial encounter: Secondary | ICD-10-CM | POA: Diagnosis not present

## 2017-02-01 MED ORDER — IBUPROFEN 400 MG PO TABS
600.0000 mg | ORAL_TABLET | Freq: Once | ORAL | Status: AC
  Administered 2017-02-01: 600 mg via ORAL
  Filled 2017-02-01: qty 1

## 2017-02-01 MED ORDER — IBUPROFEN 600 MG PO TABS: 600.0000 mg | ORAL_TABLET | Freq: Four times a day (QID) | ORAL | 0 refills | Status: DC | PRN

## 2017-02-01 NOTE — ED Provider Notes (Signed)
[facility] Provider Note   CSN: [id number] Arrival date & time: 02/01/17  0755     History   Chief Complaint Chief Complaint  Patient presents with  . Chest Pain    possible pulled muscle    HPI  Jacqueline Berry is a 56 y.o. Female With a history of hypertension, hypothyroidism and hx of breast cancer, presents with pain under her left axilla after she tried to lift up her brother yesterday. Patient reports pain as a constant dull ache, made worse with movement, particularly reaching down to pick something up. She reports she took some Tylenol which helped a little bit, she reports pain is about the same as it was yesterday, not getting worse. She denies substernal chest pain or pressure, no shortness of breath, pain is not pleuritic. He reports some tenderness on  The left side of her neck, which is also worse with movement, pain with range of motion of the shoulder. No numbness tingling or shooting pain. Patient does not have any personal history of MI or coronary artery disease. Denies exogenous estrogen use, lower extremity pain or swelling, recent travel or immobilization, history of PE or DVT, family or personal history of bleeding or clotting disorders, cough or hemoptysis.       Past Medical History:  Diagnosis Date  . Bipolar disorder ([place])   . Breast cancer ([place])    with chemo - 1997  . Hypertension   . Syphilis   . Thyroid disease   . Thyroid disease     Patient Active Problem List   Diagnosis Date Noted  . Paresthesia of both feet 10/17/2014  . Obesity 10/16/2009  . Hypothyroidism 07/11/2009  . Paranoid schizophrenia in remission ([place]) 06/24/2009  . TOBACCO ABUSE 06/24/2009  . Essential hypertension 06/24/2009  . HX, PERSONAL, MALIGNANCY, BREAST 12/07/2006    Past Surgical History:  Procedure Laterality Date  . BREAST BIOPSY  1997  . MASTECTOMY     left breat mastectomy 1997  . TUBAL LIGATION     1992    OB  History    Gravida Para Term Preterm AB Living   5 3 3   2 3    SAB TAB Ectopic Multiple Live Births     2             Home Medications    Prior to Admission medications   Medication Sig Start Date End Date Taking? Authorizing Provider  benztropine (COGENTIN) 0.5 MG tablet Take 0.5 mg by mouth daily.     [provider]  diphenhydrAMINE (BENADRYL) 25 MG tablet Take 50 mg by mouth every 6 (six) hours as needed for sleep.     [provider]  haloperidol (HALDOL) 2 MG tablet Take 2 mg by mouth daily.     [provider]  haloperidol decanoate (HALDOL DECANOATE) 100 MG/ML injection  08/10/16   [provider]  haloperidol decanoate (HALDOL DECANOATE) 50 MG/ML injection Inject 100 mg into the muscle every 28 (twenty-eight) days.    [provider]  levothyroxine (SYNTHROID, LEVOTHROID) 100 MCG tablet Take 1 tablet (100 mcg total) by mouth daily before breakfast. 10/01/16   Nche, Charlene Brooke, NP  lisinopril-hydrochlorothiazide (PRINZIDE,ZESTORETIC) 20-25 MG tablet Take 1 tablet by mouth daily. 10/01/16   Nche, Charlene Brooke, NP    Family History Family History  Problem Relation Age of Onset  . Stroke Father   . Diabetes Brother   . Coronary artery disease Brother   .  Cancer Mother   . Breast cancer Sister   . Alcohol abuse Other   . Arthritis Other   . Lung cancer Other   . Heart disease Other   . Sudden death Unknown   . Diabetes Other     Social History Social History  Substance Use Topics  . Smoking status: Current Every Day Smoker    Packs/day: 0.50    Years: 37.00    Types: Cigarettes  . Smokeless tobacco: Never Used  . Alcohol use No     Comment: Pt denies     Allergies   Latex   Review of Systems Review of Systems  Constitutional: Negative for chills and fever.  Respiratory: Negative for cough, chest tightness and shortness of breath.   Cardiovascular: Positive for chest pain (chest wall tenderness at left axila .  Negative for palpitations and leg swelling.  Gastrointestinal: Negative for abdominal pain, nausea and vomiting.  Musculoskeletal: Positive for myalgias and neck pain. Negative for arthralgias, back pain and neck stiffness.  Skin: Negative for color change and rash.  Neurological: Negative for weakness and numbness.     Physical Exam Updated Vital Signs BP 124/73 (BP Location: Right Arm)   Pulse 79   Temp 97.9 F (36.6 C) (Oral)   Resp 17   LMP 02/18/2011   SpO2 100%   Physical Exam  Constitutional: She appears well-developed and well-nourished. No distress.  HENT:  Head: Normocephalic and atraumatic.  Eyes: Right eye exhibits no discharge. Left eye exhibits no discharge.  Neck: Normal range of motion. Neck supple.  Minimal TTP on left lateral neck  Cardiovascular: Normal rate, regular rhythm, normal heart sounds and intact distal pulses.   Pulmonary/Chest: Effort normal and breath sounds normal. No respiratory distress. She has no wheezes. She has no rales. She exhibits tenderness.  Tenderness to palpation on left lateral chest wall under axilla, NTTP over sternum  Abdominal: Soft. Bowel sounds are normal. She exhibits no distension. There is no tenderness. There is no guarding.  Musculoskeletal: She exhibits no edema or deformity.  Neurological: She is alert. Coordination normal.  Speech is clear, able to follow commands CN III-XII intact Normal strength in upper and lower extremities bilaterally including dorsiflexion and plantar flexion, strong and equal grip strength Sensation normal to light and sharp touch Moves extremities without ataxia, coordination intact  Skin: Skin is warm and dry. Capillary refill takes less than 2 seconds. She is not diaphoretic.  Psychiatric: She has a normal mood and affect. Her behavior is normal.  Nursing note and vitals reviewed.    ED Treatments / Results  Labs (all labs ordered are listed, but only abnormal results are  displayed) Labs Reviewed - No data to display  EKG  EKG Interpretation  Date/Time:  Tuesday February 01 2017 08:12:26 EDT Ventricular Rate:  88 PR Interval:  180 QRS Duration: 90 QT Interval:  358 QTC Calculation: 433 R Axis:   -51 Text Interpretation:  Normal sinus rhythm Left axis deviation Low voltage QRS Possible Inferior infarct , age undetermined Cannot rule out Anterior infarct , age undetermined No changes vs comparison Confirmed by Tanna Furry [phone]) on 02/01/2017 11:54:33 AM       Radiology No results found.  Procedures Procedures (including critical care time)  Medications Ordered in ED Medications  ibuprofen (ADVIL,MOTRIN) tablet 600 mg (600 mg Oral Given 02/01/17 1111)     Initial Impression / Assessment and Plan / ED Course  I have reviewed the triage vital signs and  the nursing notes.  Pertinent labs & imaging results that were available during my care of the patient were reviewed by me and considered in my medical decision making (see chart for details).  Pt presents with pain on the left lateral chest wall after trying to left her brother. Tenderness reproducible with palpation and made worse with shoulder ROM. Unlikely ACS, given story and is EKG unremarkable. Pt cannot be PERC negative due to age, but PE unlikely as vitals are normal, no SOB and no PE risk factors. Presentation likely secondary to muscle strain. Pt treated with ibuprofen in ED with significant improvement in pain. Pt stable for discharge home with ibuprofen for pain, pt to follow up with PCP. Return precautions discussed, Pt expresses understanding and agrees with plan.  Final Clinical Impressions(s) / ED Diagnoses   Final diagnoses:  Muscle strain  Chest wall pain    New Prescriptions Discharge Medication List as of 02/01/2017 12:12 PM    START taking these medications   Details  ibuprofen (ADVIL,MOTRIN) 600 MG tablet Take 1 tablet (600 mg total) by mouth every 6 (six) hours as  needed., Starting Tue 02/01/2017, Print         Jacqlyn Larsen, PA-C 02/01/17 2233    Tanna Furry, MD 02/06/17 2353

## 2017-02-01 NOTE — ED Triage Notes (Signed)
Patient complains of left arm, shoulder and neck pain that started 1 hour yesterday after lifting a family member. Pain worse with ROM, no other associated symptoms

## 2017-02-01 NOTE — ED Notes (Signed)
Patient verbalizes understanding of discharge instructions. Opportunity for questioning and answers were provided. 

## 2017-02-01 NOTE — Discharge Instructions (Signed)
Workup is reassuring today, pain is likely caused by a muscle strain, and should get better over the next few days, you can continue to use ibuprofen for pain, I recommend doing light stretching and range of motion of your shoulder, if not feeling better after several days of this, please follow-up with your primary doctor. If you developed worsening pain in the chest, shortness of breath or other new or concerning symptoms please return to the emergency department.

## 2017-02-02 ENCOUNTER — Other Ambulatory Visit: Payer: Self-pay

## 2017-02-02 NOTE — Patient Outreach (Signed)
Outreach patient after ED visit on 02/01/17 at [facility].  I spoke with patient and she verified PCP and she is scheduling a follow up appointment soon.  Patient stated that she knows how to reach Doctor after hours.  I explained [place] and 24 Hour Nurse Advice Line.  I asked if she would like a follow up call from one of my team members if so I had a few questions to go over and she said no not at this time.  I explained that once she receives the letter from of to feel free to give [place] a call should she need our services, contact information is included.

## 2017-03-24 ENCOUNTER — Other Ambulatory Visit: Payer: Self-pay | Admitting: Internal Medicine

## 2017-03-24 DIAGNOSIS — I10 Essential (primary) hypertension: Secondary | ICD-10-CM

## 2017-03-24 DIAGNOSIS — E039 Hypothyroidism, unspecified: Secondary | ICD-10-CM

## 2017-03-24 NOTE — Telephone Encounter (Signed)
Requesting refill on   levothyroxine (SYNTHROID, LEVOTHROID) 100 MCG tablet and lisinopril-hydrochlorothiazide (PRINZIDE,ZESTORETIC) 20-25 MG tablet. [place]  03/24/17.

## 2017-03-25 MED ORDER — LISINOPRIL-HYDROCHLOROTHIAZIDE 20-25 MG PO TABS: 1.0000 | ORAL_TABLET | Freq: Every day | ORAL | 1 refills | Status: DC

## 2017-03-25 MED ORDER — LEVOTHYROXINE SODIUM 100 MCG PO TABS: 100.0000 ug | ORAL_TABLET | Freq: Every day | ORAL | 1 refills | Status: DC

## 2017-03-25 NOTE — Telephone Encounter (Signed)
OV: 09/28/16

## 2017-03-29 ENCOUNTER — Ambulatory Visit: Payer: Self-pay | Admitting: Internal Medicine

## 2017-03-30 ENCOUNTER — Encounter (HOSPITAL_COMMUNITY): Payer: Self-pay

## 2017-05-19 ENCOUNTER — Other Ambulatory Visit: Payer: Self-pay | Admitting: Internal Medicine

## 2017-05-19 DIAGNOSIS — Z139 Encounter for screening, unspecified: Secondary | ICD-10-CM

## 2017-06-07 ENCOUNTER — Ambulatory Visit
Admission: RE | Admit: 2017-06-07 | Discharge: 2017-06-07 | Disposition: A | Discharge status: Home / Self Care | Payer: Medicare Other | Source: Ambulatory Visit | Attending: Internal Medicine | Admitting: Internal Medicine

## 2017-06-07 DIAGNOSIS — Z139 Encounter for screening, unspecified: Secondary | ICD-10-CM

## 2017-06-07 DIAGNOSIS — Z1231 Encounter for screening mammogram for malignant neoplasm of breast: Secondary | ICD-10-CM | POA: Diagnosis not present

## 2017-07-14 DIAGNOSIS — L219 Seborrheic dermatitis, unspecified: Secondary | ICD-10-CM | POA: Diagnosis not present

## 2017-07-14 DIAGNOSIS — L821 Other seborrheic keratosis: Secondary | ICD-10-CM | POA: Diagnosis not present

## 2017-08-31 ENCOUNTER — Other Ambulatory Visit: Payer: Self-pay | Admitting: Internal Medicine

## 2017-08-31 DIAGNOSIS — I10 Essential (primary) hypertension: Secondary | ICD-10-CM

## 2017-08-31 DIAGNOSIS — E039 Hypothyroidism, unspecified: Secondary | ICD-10-CM

## 2017-08-31 NOTE — Telephone Encounter (Signed)
LOV: 09/28/2016 for annual with Nche NOV: 09/29/2017 for annual visit

## 2017-09-29 ENCOUNTER — Ambulatory Visit (INDEPENDENT_AMBULATORY_CARE_PROVIDER_SITE_OTHER): Payer: Medicare Other | Admitting: Internal Medicine

## 2017-09-29 ENCOUNTER — Encounter: Payer: Self-pay | Admitting: Internal Medicine

## 2017-09-29 ENCOUNTER — Other Ambulatory Visit (INDEPENDENT_AMBULATORY_CARE_PROVIDER_SITE_OTHER): Payer: Medicare Other

## 2017-09-29 VITALS — BP 122/84 | HR 76 | Temp 98.5°F | Ht 63.0 in | Wt 199.0 lb

## 2017-09-29 DIAGNOSIS — F17200 Nicotine dependence, unspecified, uncomplicated: Secondary | ICD-10-CM

## 2017-09-29 DIAGNOSIS — E039 Hypothyroidism, unspecified: Secondary | ICD-10-CM

## 2017-09-29 DIAGNOSIS — R32 Unspecified urinary incontinence: Secondary | ICD-10-CM

## 2017-09-29 DIAGNOSIS — Z1211 Encounter for screening for malignant neoplasm of colon: Secondary | ICD-10-CM

## 2017-09-29 DIAGNOSIS — Z6835 Body mass index (BMI) 35.0-35.9, adult: Secondary | ICD-10-CM | POA: Diagnosis not present

## 2017-09-29 DIAGNOSIS — Z0000 Encounter for general adult medical examination without abnormal findings: Secondary | ICD-10-CM | POA: Diagnosis not present

## 2017-09-29 DIAGNOSIS — I10 Essential (primary) hypertension: Secondary | ICD-10-CM | POA: Diagnosis not present

## 2017-09-29 LAB — CBC
HEMATOCRIT: 39.9 % (ref 36.0–46.0)
Hemoglobin: 13.5 g/dL (ref 12.0–15.0)
MCHC: 33.8 g/dL (ref 30.0–36.0)
MCV: 85.4 fl (ref 78.0–100.0)
PLATELETS: 237 10*3/uL (ref 150.0–400.0)
RBC: 4.67 Mil/uL (ref 3.87–5.11)
RDW: 14.2 % (ref 11.5–15.5)
WBC: 6.5 10*3/uL (ref 4.0–10.5)

## 2017-09-29 LAB — VITAMIN B12: Vitamin B-12: 268 pg/mL (ref 211–911)

## 2017-09-29 LAB — COMPREHENSIVE METABOLIC PANEL
ALT: 18 U/L (ref 0–35)
AST: 16 U/L (ref 0–37)
Albumin: 4 g/dL (ref 3.5–5.2)
Alkaline Phosphatase: 86 U/L (ref 39–117)
BUN: 11 mg/dL (ref 6–23)
CALCIUM: 9.5 mg/dL (ref 8.4–10.5)
CHLORIDE: 104 meq/L (ref 96–112)
CO2: 31 meq/L (ref 19–32)
Creatinine, Ser: 0.93 mg/dL (ref 0.40–1.20)
GFR: 79.69 mL/min (ref >60.00)
Glucose, Bld: 84 mg/dL (ref 70–99)
POTASSIUM: 3.2 meq/L — AB (ref 3.5–5.1)
Sodium: 141 mEq/L (ref 135–145)
Total Bilirubin: 0.4 mg/dL (ref 0.2–1.2)
Total Protein: 7.2 g/dL (ref 6.0–8.3)

## 2017-09-29 LAB — LIPID PANEL
CHOL/HDL RATIO: 5
CHOLESTEROL: 164 mg/dL (ref 0–200)
HDL: 31.9 mg/dL — ABNORMAL LOW (ref >39.00)
NonHDL: 132.38
TRIGLYCERIDES: 208 mg/dL — AB (ref 0.0–149.0)
VLDL: 41.6 mg/dL — AB (ref 0.0–40.0)

## 2017-09-29 LAB — T4, FREE: FREE T4: 1.12 ng/dL (ref 0.60–1.60)

## 2017-09-29 LAB — HEMOGLOBIN A1C: Hgb A1c MFr Bld: 5.8 % (ref 4.6–6.5)

## 2017-09-29 LAB — TSH: TSH: 0.75 u[IU]/mL (ref 0.35–4.50)

## 2017-09-29 LAB — LDL CHOLESTEROL, DIRECT: Direct LDL: 97 mg/dL

## 2017-09-29 MED ORDER — MIRABEGRON ER 25 MG PO TB24: 25.0000 mg | ORAL_TABLET | Freq: Every day | ORAL | 1 refills | Status: DC

## 2017-09-29 NOTE — Patient Instructions (Addendum)
We have sent in myrbetriq to take for the bladder to help. It is 1 pill daily and after 1 month if you do not notice any benefit let us know and stop taking it.   Health Maintenance, Female Adopting a healthy lifestyle and getting preventive care can go a long way to promote health and wellness. Talk with your health care provider about what schedule of regular examinations is right for you. This is a good chance for you to check in with your provider about disease prevention and staying healthy. In between checkups, there are plenty of things you can do on your own. Experts have done a lot of research about which lifestyle changes and preventive measures are most likely to keep you healthy. Ask your health care provider for more information. Weight and diet Eat a healthy diet  Be sure to include plenty of vegetables, fruits, low-fat dairy products, and lean protein.  Do not eat a lot of foods high in solid fats, added sugars, or salt.  Get regular exercise. This is one of the most important things you can do for your health. ? Most adults should exercise for at least 150 minutes each week. The exercise should increase your heart rate and make you sweat (moderate-intensity exercise). ? Most adults should also do strengthening exercises at least twice a week. This is in addition to the moderate-intensity exercise.  Maintain a healthy weight  Body mass index (BMI) is a measurement that can be used to identify possible weight problems. It estimates body fat based on height and weight. Your health care provider can help determine your BMI and help you achieve or maintain a healthy weight.  For females 24 years of age and older: ? A BMI below 18.5 is considered underweight. ? A BMI of 18.5 to 24.9 is normal. ? A BMI of 25 to 29.9 is considered overweight. ? A BMI of 30 and above is considered obese.  Watch levels of cholesterol and blood lipids  You should start having your blood tested for  lipids and cholesterol at 58 years of age, then have this test every 5 years.  You may need to have your cholesterol levels checked more often if: ? Your lipid or cholesterol levels are high. ? You are older than 58 years of age. ? You are at high risk for heart disease.  Cancer screening Lung Cancer  Lung cancer screening is recommended for adults 45-27 years old who are at high risk for lung cancer because of a history of smoking.  A yearly low-dose CT scan of the lungs is recommended for people who: ? Currently smoke. ? Have quit within the past 15 years. ? Have at least a 30-pack-year history of smoking. A pack year is smoking an average of one pack of cigarettes a day for 1 year.  Yearly screening should continue until it has been 15 years since you quit.  Yearly screening should stop if you develop a health problem that would prevent you from having lung cancer treatment.  Breast Cancer  Practice breast self-awareness. This means understanding how your breasts normally appear and feel.  It also means doing regular breast self-exams. Let your health care provider know about any changes, no matter how small.  If you are in your 20s or 30s, you should have a clinical breast exam (CBE) by a health care provider every 1-3 years as part of a regular health exam.  If you are 6 or older, have a CBE  every year. Also consider having a breast X-ray (mammogram) every year.  If you have a family history of breast cancer, talk to your health care provider about genetic screening.  If you are at high risk for breast cancer, talk to your health care provider about having an MRI and a mammogram every year.  Breast cancer gene (BRCA) assessment is recommended for women who have family members with BRCA-related cancers. BRCA-related cancers include: ? Breast. ? Ovarian. ? Tubal. ? Peritoneal cancers.  Results of the assessment will determine the need for genetic counseling and BRCA1 and  BRCA2 testing.  Cervical Cancer Your health care provider may recommend that you be screened regularly for cancer of the pelvic organs (ovaries, uterus, and vagina). This screening involves a pelvic examination, including checking for microscopic changes to the surface of your cervix (Pap test). You may be encouraged to have this screening done every 3 years, beginning at age 69.  For women ages 61-65, health care providers may recommend pelvic exams and Pap testing every 3 years, or they may recommend the Pap and pelvic exam, combined with testing for human papilloma virus (HPV), every 5 years. Some types of HPV increase your risk of cervical cancer. Testing for HPV may also be done on women of any age with unclear Pap test results.  Other health care providers may not recommend any screening for nonpregnant women who are considered low risk for pelvic cancer and who do not have symptoms. Ask your health care provider if a screening pelvic exam is right for you.  If you have had past treatment for cervical cancer or a condition that could lead to cancer, you need Pap tests and screening for cancer for at least 20 years after your treatment. If Pap tests have been discontinued, your risk factors (such as having a new sexual partner) need to be reassessed to determine if screening should resume. Some women have medical problems that increase the chance of getting cervical cancer. In these cases, your health care provider may recommend more frequent screening and Pap tests.  Colorectal Cancer  This type of cancer can be detected and often prevented.  Routine colorectal cancer screening usually begins at 58 years of age and continues through 58 years of age.  Your health care provider may recommend screening at an earlier age if you have risk factors for colon cancer.  Your health care provider may also recommend using home test kits to check for hidden blood in the stool.  A small camera at the  end of a tube can be used to examine your colon directly (sigmoidoscopy or colonoscopy). This is done to check for the earliest forms of colorectal cancer.  Routine screening usually begins at age 33.  Direct examination of the colon should be repeated every 5-10 years through 58 years of age. However, you may need to be screened more often if early forms of precancerous polyps or small growths are found.  Skin Cancer  Check your skin from head to toe regularly.  Tell your health care provider about any new moles or changes in moles, especially if there is a change in a mole's shape or color.  Also tell your health care provider if you have a mole that is larger than the size of a pencil eraser.  Always use sunscreen. Apply sunscreen liberally and repeatedly throughout the day.  Protect yourself by wearing long sleeves, pants, a wide-brimmed hat, and sunglasses whenever you are outside.  Heart disease, diabetes,  and high blood pressure  High blood pressure causes heart disease and increases the risk of stroke. High blood pressure is more likely to develop in: ? People who have blood pressure in the high end of the normal range (130-139/85-89 mm Hg). ? People who are overweight or obese. ? People who are African American.  If you are 48-19 years of age, have your blood pressure checked every 3-5 years. If you are 36 years of age or older, have your blood pressure checked every year. You should have your blood pressure measured twice-once when you are at a hospital or clinic, and once when you are not at a hospital or clinic. Record the average of the two measurements. To check your blood pressure when you are not at a hospital or clinic, you can use: ? An automated blood pressure machine at a pharmacy. ? A home blood pressure monitor.  If you are between 38 years and 67 years old, ask your health care provider if you should take aspirin to prevent strokes.  Have regular diabetes  screenings. This involves taking a blood sample to check your fasting blood sugar level. ? If you are at a normal weight and have a low risk for diabetes, have this test once every three years after 58 years of age. ? If you are overweight and have a high risk for diabetes, consider being tested at a younger age or more often. Preventing infection Hepatitis B  If you have a higher risk for hepatitis B, you should be screened for this virus. You are considered at high risk for hepatitis B if: ? You were born in a country where hepatitis B is common. Ask your health care provider which countries are considered high risk. ? Your parents were born in a high-risk country, and you have not been immunized against hepatitis B (hepatitis B vaccine). ? You have HIV or AIDS. ? You use needles to inject street drugs. ? You live with someone who has hepatitis B. ? You have had sex with someone who has hepatitis B. ? You get hemodialysis treatment. ? You take certain medicines for conditions, including cancer, organ transplantation, and autoimmune conditions.  Hepatitis C  Blood testing is recommended for: ? Everyone born from [birth year] through 1966. ? Anyone with known risk factors for hepatitis C.  Sexually transmitted infections (STIs)  You should be screened for sexually transmitted infections (STIs) including gonorrhea and chlamydia if: ? You are sexually active and are younger than 58 years of age. ? You are older than 58 years of age and your health care provider tells you that you are at risk for this type of infection. ? Your sexual activity has changed since you were last screened and you are at an increased risk for chlamydia or gonorrhea. Ask your health care provider if you are at risk.  If you do not have HIV, but are at risk, it may be recommended that you take a prescription medicine daily to prevent HIV infection. This is called pre-exposure prophylaxis (PrEP). You are considered at risk  if: ? You are sexually active and do not regularly use condoms or know the HIV status of your partner(s). ? You take drugs by injection. ? You are sexually active with a partner who has HIV.  Talk with your health care provider about whether you are at high risk of being infected with HIV. If you choose to begin PrEP, you should first be tested for HIV. You should then  be tested every 3 months for as long as you are taking PrEP. Pregnancy  If you are premenopausal and you may become pregnant, ask your health care provider about preconception counseling.  If you may become pregnant, take 400 to 800 micrograms (mcg) of folic acid every day.  If you want to prevent pregnancy, talk to your health care provider about birth control (contraception). Osteoporosis and menopause  Osteoporosis is a disease in which the bones lose minerals and strength with aging. This can result in serious bone fractures. Your risk for osteoporosis can be identified using a bone density scan.  If you are 16 years of age or older, or if you are at risk for osteoporosis and fractures, ask your health care provider if you should be screened.  Ask your health care provider whether you should take a calcium or vitamin D supplement to lower your risk for osteoporosis.  Menopause may have certain physical symptoms and risks.  Hormone replacement therapy may reduce some of these symptoms and risks. Talk to your health care provider about whether hormone replacement therapy is right for you. Follow these instructions at home:  Schedule regular health, dental, and eye exams.  Stay current with your immunizations.  Do not use any tobacco products including cigarettes, chewing tobacco, or electronic cigarettes.  If you are pregnant, do not drink alcohol.  If you are breastfeeding, limit how much and how often you drink alcohol.  Limit alcohol intake to no more than 1 drink per day for nonpregnant women. One drink  equals 12 ounces of beer, 5 ounces of wine, or 1 ounces of hard liquor.  Do not use street drugs.  Do not share needles.  Ask your health care provider for help if you need support or information about quitting drugs.  Tell your health care provider if you often feel depressed.  Tell your health care provider if you have ever been abused or do not feel safe at home. This information is not intended to replace advice given to you by your health care provider. Make sure you discuss any questions you have with your health care provider. Document Released: 10/19/2010 Document Revised: 09/11/2015 Document Reviewed: 01/07/2015 Elsevier Interactive Patient Education  [employer].

## 2017-09-29 NOTE — Progress Notes (Signed)
   Subjective:    Patient ID: Jacqueline Berry, female    DOB: [birth year], 57 y.o.   MRN: [id number]  HPI Here for medicare wellness and physical, no new complaints. Please see A/P for status and treatment of chronic medical problems. Some new urinary incontinence.  Diet: heart healthy  Physical activity: sedentary Depression/mood screen: negative, see psych Hearing: intact to whispered voice Visual acuity: grossly normal, performs annual eye exam  ADLs: capable Fall risk: none Home safety: good Cognitive evaluation: intact to orientation, naming, recall and repetition EOL planning: adv directives discussed  I have personally reviewed and have noted 1. The patient's medical and social history - reviewed today no changes 2. Their use of alcohol, tobacco or illicit drugs 3. Their current medications and supplements 4. The patient's functional ability including ADL's, fall risks, home safety risks and hearing or visual impairment. 5. Diet and physical activities 6. Evidence for depression or mood disorders 7. Care team reviewed and updated (available in snapshot)  Review of Systems  Constitutional: Negative.   HENT: Negative.   Eyes: Negative.   Respiratory: Negative for cough, chest tightness and shortness of breath.   Cardiovascular: Negative for chest pain, palpitations and leg swelling.  Gastrointestinal: Negative for abdominal distention, abdominal pain, constipation, diarrhea, nausea and vomiting.  Musculoskeletal: Negative.   Skin: Negative.   Neurological: Negative.   Psychiatric/Behavioral: Negative.       Objective:   Physical Exam  Constitutional: She is oriented to person, place, and time. She appears well-developed and well-nourished.  overweight  HENT:  Head: Normocephalic and atraumatic.  Eyes: EOM are normal.  Neck: Normal range of motion.  Cardiovascular: Normal rate and regular rhythm.  Pulmonary/Chest: Effort normal and breath sounds normal. No  respiratory distress. She has no wheezes. She has no rales.  Abdominal: Soft. Bowel sounds are normal. She exhibits no distension. There is no tenderness. There is no rebound.  Musculoskeletal: She exhibits no edema.  Neurological: She is alert and oriented to person, place, and time. Coordination normal.  Skin: Skin is warm and dry.  Psychiatric: She has a normal mood and affect.   Vitals:   09/29/17 1523  BP: 122/84  Pulse: 76  Temp: 98.5 F (36.9 C)  TempSrc: Oral  SpO2: 96%  Weight: 199 lb (90.3 kg)  Height: 5\' 3"  (1.6 m)      Assessment & Plan:

## 2017-09-30 DIAGNOSIS — Z0000 Encounter for general adult medical examination without abnormal findings: Secondary | ICD-10-CM | POA: Insufficient documentation

## 2017-09-30 DIAGNOSIS — R32 Unspecified urinary incontinence: Secondary | ICD-10-CM | POA: Insufficient documentation

## 2017-09-30 MED ORDER — VARENICLINE TARTRATE 1 MG PO TABS: 1.0000 mg | ORAL_TABLET | Freq: Two times a day (BID) | ORAL | 2 refills | Status: DC

## 2017-09-30 MED ORDER — VARENICLINE TARTRATE 0.5 MG X 11 & 1 MG X 42 PO MISC: ORAL | 0 refills | Status: DC

## 2017-09-30 NOTE — Assessment & Plan Note (Signed)
BP at goal on lisinopril/hctz 20/25 mg daily. Checking CMP and adjust as needed.  ?

## 2017-09-30 NOTE — Assessment & Plan Note (Signed)
Checking TSH and free T4 and adjust synthroid 100 mcg daily if needed.  

## 2017-09-30 NOTE — Assessment & Plan Note (Signed)
Time spent counseling about tobacco usage: 4 minutes. I have asked about smoking and is smoking same as usual. The patient is advised to quit. The patient is willing to quit with chantix which is prescribed today. They would like to try to quit in the next 3 months. We will follow up with them in 6 months. Rx for chantix and advised on usage for maximum efficacy.

## 2017-09-30 NOTE — Assessment & Plan Note (Signed)
BMI 35 and complicated by hypertension, urinary incontinence

## 2017-09-30 NOTE — Assessment & Plan Note (Signed)
Rx for myrbetriq.

## 2017-09-30 NOTE — Assessment & Plan Note (Signed)
Flu shot yearly. Shingrix counseled. Tetanus up to date. Cologuard ordered. Mammogram up to date, pap smear up to date. Counseled about sun safety and mole surveillance. Counseled about the dangers of distracted driving. Given 10 year screening recommendations.

## 2017-10-03 ENCOUNTER — Encounter (HOSPITAL_COMMUNITY): Payer: Self-pay

## 2017-10-03 ENCOUNTER — Ambulatory Visit (HOSPITAL_COMMUNITY)
Admission: EM | Admit: 2017-10-03 | Discharge: 2017-10-03 | Disposition: A | Discharge status: Home / Self Care | Payer: Medicare Other | Attending: Urgent Care | Admitting: Urgent Care

## 2017-10-03 DIAGNOSIS — M62838 Other muscle spasm: Secondary | ICD-10-CM

## 2017-10-03 DIAGNOSIS — M542 Cervicalgia: Secondary | ICD-10-CM

## 2017-10-03 MED ORDER — CYCLOBENZAPRINE HCL 5 MG PO TABS: 5.0000 mg | ORAL_TABLET | Freq: Three times a day (TID) | ORAL | 0 refills | Status: DC | PRN

## 2017-10-03 MED ORDER — MELOXICAM 7.5 MG PO TABS: 7.5000 mg | ORAL_TABLET | Freq: Every day | ORAL | 0 refills | Status: DC

## 2017-10-03 NOTE — ED Provider Notes (Signed)
MRN: [id number] DOB: 19-Dec-1960  Subjective:   Jacqueline Berry is a 57 y.o. female presenting for 1 day history of right-sided neck discomfort.  Patient rates her pain 2 out of 10, states that it is aching in nature.  Reports that she thinks she slept wrong.  States that she has had a difficult time sleeping in a comfortable position.  Denies fever, trauma, falls, numbness or tingling, weakness, radicular pain.  She has not tried any medications for pain relief.  She does not hydrate with water, states that she only drinks it to take her pills.  No current facility-administered medications for this encounter.   Current Outpatient Medications:  .  benztropine (COGENTIN) 0.5 MG tablet, Take 0.5 mg by mouth daily. , Disp: , Rfl:  .  haloperidol (HALDOL) 2 MG tablet, Take 2 mg by mouth daily. , Disp: , Rfl:  .  haloperidol decanoate (HALDOL DECANOATE) 100 MG/ML injection, , Disp: , Rfl:  .  haloperidol decanoate (HALDOL DECANOATE) 50 MG/ML injection, Inject 100 mg into the muscle every 28 (twenty-eight) days., Disp: , Rfl:  .  levothyroxine (SYNTHROID, LEVOTHROID) 100 MCG tablet, TAKE 1 TABLET BY MOUTH EVERY DAY, Disp: 90 tablet, Rfl: 0 .  lisinopril-hydrochlorothiazide (PRINZIDE,ZESTORETIC) 20-25 MG tablet, TAKE 1 TABLET BY MOUTH ONCE DAILY, Disp: 90 tablet, Rfl: 0 .  diphenhydrAMINE (BENADRYL) 25 MG tablet, Take 50 mg by mouth every 6 (six) hours as needed for sleep. , Disp: , Rfl:  .  ibuprofen (ADVIL,MOTRIN) 600 MG tablet, Take 1 tablet (600 mg total) by mouth every 6 (six) hours as needed. (Patient not taking: Reported on 09/29/2017), Disp: 30 tablet, Rfl: 0 .  mirabegron ER (MYRBETRIQ) 25 MG TB24 tablet, Take 1 tablet (25 mg total) by mouth daily., Disp: 90 tablet, Rfl: 1 .  varenicline (CHANTIX CONTINUING MONTH PAK) 1 MG tablet, Take 1 tablet (1 mg total) by mouth 2 (two) times daily., Disp: 60 tablet, Rfl: 2 .  varenicline (CHANTIX STARTING MONTH PAK) 0.5 MG X 11 & 1 MG X 42 tablet, Take one 0.5  mg tablet by mouth once daily for 3 days, then increase to one 0.5 mg tablet twice daily for 4 days, then increase to one 1 mg tablet twice daily., Disp: 53 tablet, Rfl: 0   Allergies  Allergen Reactions  . Latex     REACTION: itchiong    Past Medical History:  Diagnosis Date  . Bipolar disorder ([place])   . Breast cancer ([place])    with chemo - 1997  . Hypertension   . Syphilis   . Thyroid disease   . Thyroid disease      Past Surgical History:  Procedure Laterality Date  . BREAST BIOPSY  1997  . MASTECTOMY     left breat mastectomy 1997  . TUBAL LIGATION     1992    Objective:   Vitals: BP (!) 137/95 (BP Location: Left Arm)   Pulse 66   Temp 98.3 F (36.8 C) (Oral)   Resp 19   LMP 02/18/2011   SpO2 100%   Physical Exam  Constitutional: She is oriented to person, place, and time. She appears well-developed and well-nourished.  Eyes: Pupils are equal, round, and reactive to light. EOM are normal.  Neck: Normal range of motion. Neck supple.  Cardiovascular: Normal rate.  Pulmonary/Chest: Effort normal.  Musculoskeletal:       Cervical back: She exhibits pain (by report only over right lateral neck) and spasm. She exhibits normal range of  motion, no tenderness, no bony tenderness, no swelling, no edema and no deformity.  Lymphadenopathy:    She has no cervical adenopathy.  Neurological: She is alert and oriented to person, place, and time.  Skin: Skin is warm and dry.  Psychiatric: She has a normal mood and affect.   Assessment and Plan :   Muscle spasms of neck  Neck pain  Will use conservative management with meloxicam and Flexeril.  Recommended patient hydrate well each day. Counseled patient on potential for adverse effects with medications prescribed today, patient verbalized understanding. Return-to-clinic precautions discussed, patient verbalized understanding.    Jaynee Eagles, PA-C 10/03/17 2010

## 2017-10-03 NOTE — Discharge Instructions (Signed)
Hydrate well with at least 2 liters (1 gallon) of water daily.  °

## 2017-10-03 NOTE — ED Triage Notes (Signed)
Pt says pain started last night in her neck when she laid down and it progressively got worse.

## 2017-10-03 NOTE — ED Notes (Signed)
Pt discharged by provider.

## 2017-10-06 DIAGNOSIS — Z1211 Encounter for screening for malignant neoplasm of colon: Secondary | ICD-10-CM | POA: Diagnosis not present

## 2017-10-06 LAB — COLOGUARD: COLOGUARD: NEGATIVE

## 2017-10-18 ENCOUNTER — Encounter: Payer: Self-pay | Admitting: Internal Medicine

## 2017-10-18 NOTE — Progress Notes (Signed)
Abstracted and sent to scan and patient informed

## 2017-11-02 ENCOUNTER — Other Ambulatory Visit: Payer: Self-pay | Admitting: Urgent Care

## 2017-11-03 ENCOUNTER — Other Ambulatory Visit: Payer: Self-pay

## 2017-11-03 ENCOUNTER — Emergency Department (HOSPITAL_COMMUNITY): Payer: Medicare Other

## 2017-11-03 ENCOUNTER — Encounter (HOSPITAL_COMMUNITY): Payer: Self-pay

## 2017-11-03 ENCOUNTER — Ambulatory Visit (HOSPITAL_COMMUNITY)
Admission: EM | Admit: 2017-11-03 | Discharge: 2017-11-03 | Disposition: A | Discharge status: Home / Self Care | Payer: Medicare Other | Source: Home / Self Care

## 2017-11-03 ENCOUNTER — Encounter (HOSPITAL_COMMUNITY): Payer: Self-pay | Admitting: Emergency Medicine

## 2017-11-03 ENCOUNTER — Emergency Department (HOSPITAL_COMMUNITY)
Admission: EM | Admit: 2017-11-03 | Discharge: 2017-11-03 | Disposition: A | Discharge status: Home / Self Care | Payer: Medicare Other | Attending: Emergency Medicine | Admitting: Emergency Medicine

## 2017-11-03 DIAGNOSIS — E039 Hypothyroidism, unspecified: Secondary | ICD-10-CM | POA: Insufficient documentation

## 2017-11-03 DIAGNOSIS — E876 Hypokalemia: Secondary | ICD-10-CM | POA: Diagnosis not present

## 2017-11-03 DIAGNOSIS — R9431 Abnormal electrocardiogram [ECG] [EKG]: Secondary | ICD-10-CM

## 2017-11-03 DIAGNOSIS — F17210 Nicotine dependence, cigarettes, uncomplicated: Secondary | ICD-10-CM | POA: Diagnosis not present

## 2017-11-03 DIAGNOSIS — M79602 Pain in left arm: Secondary | ICD-10-CM | POA: Diagnosis not present

## 2017-11-03 DIAGNOSIS — I10 Essential (primary) hypertension: Secondary | ICD-10-CM | POA: Diagnosis not present

## 2017-11-03 DIAGNOSIS — M5489 Other dorsalgia: Secondary | ICD-10-CM | POA: Diagnosis not present

## 2017-11-03 DIAGNOSIS — M62838 Other muscle spasm: Secondary | ICD-10-CM | POA: Insufficient documentation

## 2017-11-03 DIAGNOSIS — Z79899 Other long term (current) drug therapy: Secondary | ICD-10-CM | POA: Diagnosis not present

## 2017-11-03 LAB — BASIC METABOLIC PANEL
ANION GAP: 10 (ref 5–15)
BUN: 9 mg/dL (ref 6–20)
CHLORIDE: 105 mmol/L (ref 98–111)
CO2: 28 mmol/L (ref 22–32)
Calcium: 9.4 mg/dL (ref 8.9–10.3)
Creatinine, Ser: 0.93 mg/dL (ref 0.44–1.00)
GFR calc Af Amer: >60 mL/min (ref >60)
GFR calc non Af Amer: >60 mL/min (ref >60)
Glucose, Bld: 116 mg/dL — ABNORMAL HIGH (ref 70–99)
POTASSIUM: 2.9 mmol/L — AB (ref 3.5–5.1)
Sodium: 143 mmol/L (ref 135–145)

## 2017-11-03 LAB — CBC
HEMATOCRIT: 44.4 % (ref 36.0–46.0)
HEMOGLOBIN: 14.1 g/dL (ref 12.0–15.0)
MCH: 28 pg (ref 26.0–34.0)
MCHC: 31.8 g/dL (ref 30.0–36.0)
MCV: 88.3 fL (ref 78.0–100.0)
Platelets: 271 10*3/uL (ref 150–400)
RBC: 5.03 MIL/uL (ref 3.87–5.11)
RDW: 14.6 % (ref 11.5–15.5)
WBC: 6.2 10*3/uL (ref 4.0–10.5)

## 2017-11-03 LAB — I-STAT TROPONIN, ED
Troponin i, poc: 0 ng/mL (ref 0.00–0.08)
Troponin i, poc: 0 ng/mL (ref 0.00–0.08)

## 2017-11-03 LAB — MAGNESIUM: Magnesium: 1.8 mg/dL (ref 1.7–2.4)

## 2017-11-03 LAB — D-DIMER, QUANTITATIVE (NOT AT ARMC): D DIMER QUANT: 0.49 ug{FEU}/mL (ref 0.00–0.50)

## 2017-11-03 MED ORDER — POTASSIUM CHLORIDE CRYS ER 20 MEQ PO TBCR
40.0000 meq | EXTENDED_RELEASE_TABLET | Freq: Once | ORAL | Status: AC
  Administered 2017-11-03: 40 meq via ORAL
  Filled 2017-11-03: qty 2

## 2017-11-03 MED ORDER — POTASSIUM CHLORIDE CRYS ER 20 MEQ PO TBCR: 20.0000 meq | EXTENDED_RELEASE_TABLET | Freq: Every day | ORAL | 0 refills | Status: DC

## 2017-11-03 NOTE — ED Triage Notes (Signed)
Patient sent to ED from [place]. Patient complained of back and left arm spasm yesterday that resolved on its own. Patient denies complaints at this time. Patient reports she was told at Urgent Care they were referring her to ED because her EKG has changed from previous. Patient alert, oriented, and in no apparent distress at this time.

## 2017-11-03 NOTE — ED Notes (Signed)
Declined W/C at D/C and was escorted to lobby by RN. 

## 2017-11-03 NOTE — ED Provider Notes (Signed)
[facility] EMERGENCY DEPARTMENT Provider Note   CSN: [id number] Arrival date & time: 11/03/17  0915     History   Chief Complaint Chief Complaint  Patient presents with  . Spasms    HPI Jacqueline Berry is a 57 y.o. female.  HPI Patient states she sleeps with her left arm extended of her head.  She began having spasms to the left thoracic back extending into the back of her left arm throughout the night.  States that the symptoms have completely resolved.  She had no numbness or tingling at any point.  Denies any neck pain or extremity weakness.  She is now completely symptom-free. Past Medical History:  Diagnosis Date  . Bipolar disorder ([place])   . Breast cancer ([place])    with chemo - 1997  . Hypertension   . Syphilis   . Thyroid disease   . Thyroid disease     Patient Active Problem List   Diagnosis Date Noted  . Urinary incontinence 09/30/2017  . Routine general medical examination at a health care facility 09/30/2017  . Paresthesia of both feet 10/17/2014  . Obesity 10/16/2009  . Hypothyroidism 07/11/2009  . Paranoid schizophrenia in remission ([place]) 06/24/2009  . TOBACCO ABUSE 06/24/2009  . Essential hypertension 06/24/2009  . HX, PERSONAL, MALIGNANCY, BREAST 12/07/2006    Past Surgical History:  Procedure Laterality Date  . BREAST BIOPSY  1997  . MASTECTOMY     left breat mastectomy 1997  . TUBAL LIGATION     1992     OB History    Gravida  5   Para  3   Term  3   Preterm      AB  2   Living  3     SAB      TAB  2   Ectopic      Multiple      Live Births               Home Medications    Prior to Admission medications   Medication Sig Start Date End Date Taking? Authorizing Provider  benztropine (COGENTIN) 0.5 MG tablet Take 0.5 mg by mouth daily.    Yes [provider]  Chlorpheniramine Maleate (ALLERGY PO) Take 1 tablet by mouth daily.   Yes [provider]  cyclobenzaprine (FLEXERIL) 5  MG tablet Take 1 tablet (5 mg total) by mouth 3 (three) times daily as needed for muscle spasms. 10/03/17  Yes Jaynee Eagles, PA-C  haloperidol (HALDOL) 5 MG tablet Take 2.5 mg by mouth daily.    Yes [provider]  haloperidol decanoate (HALDOL DECANOATE) 50 MG/ML injection Inject 100 mg into the muscle every 28 (twenty-eight) days.   Yes [provider]  levothyroxine (SYNTHROID, LEVOTHROID) 100 MCG tablet TAKE 1 TABLET BY MOUTH EVERY DAY 09/01/17  Yes Hoyt Koch, MD  lisinopril-hydrochlorothiazide (PRINZIDE,ZESTORETIC) 20-25 MG tablet TAKE 1 TABLET BY MOUTH ONCE DAILY 09/01/17  Yes Hoyt Koch, MD  loratadine (ALLERGY) 10 MG tablet Take 10 mg by mouth daily.   Yes [provider]  meloxicam (MOBIC) 7.5 MG tablet Take 1 tablet (7.5 mg total) by mouth daily. Patient not taking: Reported on 11/03/2017 10/03/17   Jaynee Eagles, PA-C  mirabegron ER (MYRBETRIQ) 25 MG TB24 tablet Take 1 tablet (25 mg total) by mouth daily. Patient not taking: Reported on 11/03/2017 09/29/17   Hoyt Koch, MD  potassium chloride SA (K-DUR,KLOR-CON) 20 MEQ tablet Take 1 tablet (  20 mEq total) by mouth daily. 11/03/17   Julianne Rice, MD  varenicline (CHANTIX CONTINUING MONTH PAK) 1 MG tablet Take 1 tablet (1 mg total) by mouth 2 (two) times daily. Patient not taking: Reported on 11/03/2017 09/30/17   Hoyt Koch, MD  varenicline (CHANTIX STARTING MONTH PAK) 0.5 MG X 11 & 1 MG X 42 tablet Take one 0.5 mg tablet by mouth once daily for 3 days, then increase to one 0.5 mg tablet twice daily for 4 days, then increase to one 1 mg tablet twice daily. Patient not taking: Reported on 11/03/2017 09/30/17   Hoyt Koch, MD    Family History Family History  Problem Relation Age of Onset  . Stroke Father   . Diabetes Brother   . Coronary artery disease Brother   . Cancer Mother   . Breast cancer Sister   . Alcohol abuse Other   . Arthritis Other   . Lung  cancer Other   . Heart disease Other   . Sudden death Unknown   . Diabetes Other     Social History Social History   Tobacco Use  . Smoking status: Current Every Day Smoker    Packs/day: 0.50    Years: 37.00    Pack years: 18.50    Types: Cigarettes  . Smokeless tobacco: Never Used  Substance Use Topics  . Alcohol use: No    Comment: Pt denies  . Drug use: No    Comment: Pt denies      Allergies   Latex   Review of Systems Review of Systems  Constitutional: Negative for chills and fever.  Eyes: Negative for visual disturbance.  Respiratory: Negative for cough and shortness of breath.   Cardiovascular: Negative for chest pain, palpitations and leg swelling.  Gastrointestinal: Negative for abdominal pain, constipation, diarrhea, nausea and vomiting.  Genitourinary: Negative for dysuria, flank pain, frequency and hematuria.  Musculoskeletal: Positive for back pain and myalgias.  Skin: Negative for rash and wound.  Neurological: Negative for dizziness, weakness, light-headedness, numbness and headaches.  All other systems reviewed and are negative.    Physical Exam Updated Vital Signs BP (!) 144/104   Pulse (!) 42   Temp 98.9 F (37.2 C) (Oral)   Resp 18   Ht 5\' 3"  (1.6 m)   Wt 89.4 kg (197 lb)   LMP 02/18/2011   SpO2 92%   BMI 34.90 kg/m   Physical Exam  Constitutional: She is oriented to person, place, and time. She appears well-developed and well-nourished.  HENT:  Head: Normocephalic and atraumatic.  Mouth/Throat: Oropharynx is clear and moist.  Eyes: Pupils are equal, round, and reactive to light. EOM are normal.  Neck: Normal range of motion. Neck supple. No JVD present.  No posterior midline cervical tenderness to palpation.  No meningismus.  Cardiovascular: Normal rate and regular rhythm. Exam reveals no gallop and no friction rub.  No murmur heard. Pulmonary/Chest: Effort normal and breath sounds normal. No stridor. No respiratory distress. She  has no wheezes. She has no rales. She exhibits no tenderness.  Abdominal: Soft. Bowel sounds are normal. There is no tenderness. There is no rebound and no guarding.  Musculoskeletal: Normal range of motion. She exhibits no edema or tenderness.  Negative Tinel and Phalen sign.  No upper or lower extremity swelling, asymmetry or tenderness.  No midline thoracic or lumbar tenderness.  No CVA tenderness.  Lymphadenopathy:    She has no cervical adenopathy.  Neurological: She is alert and  oriented to person, place, and time.  Moves all extremities without focal deficit.  Sensation fully intact.  Skin: Skin is warm and dry. No rash noted. No erythema.  Psychiatric: She has a normal mood and affect. Her behavior is normal.  Nursing note and vitals reviewed.    ED Treatments / Results  Labs (all labs ordered are listed, but only abnormal results are displayed) Labs Reviewed  BASIC METABOLIC PANEL - Abnormal; Notable for the following components:      Result Value   Potassium 2.9 (*)    Glucose, Bld 116 (*)    All other components within normal limits  CBC  MAGNESIUM  D-DIMER, QUANTITATIVE (NOT AT [facility])  I-STAT TROPONIN, ED  I-STAT TROPONIN, ED    EKG EKG Interpretation  Date/Time:  Thursday November 03 2017 09:21:45 EDT Ventricular Rate:  100 PR Interval:  168 QRS Duration: 108 QT Interval:  366 QTC Calculation: 472 R Axis:   -71 Text Interpretation:  Normal sinus rhythm Left anterior fascicular block Left ventricular hypertrophy with repolarization abnormality Cannot rule out Septal infarct , age undetermined Possible Lateral infarct , age undetermined Abnormal ECG Confirmed by Julianne Rice [phone]) on 11/03/2017 12:15:01 PM   Radiology Dg Chest 2 View  Result Date: 11/03/2017 CLINICAL DATA:  Patient with left arm and upper back pain EXAM: CHEST - 2 VIEW COMPARISON:  Chest radiograph 03/19/2016. FINDINGS: Stable cardiac and mediastinal contours. Right basilar atelectasis. No  large area pulmonary consolidation. No pleural effusion or pneumothorax. Thoracic spine degenerative changes. Left axillary surgical clips. IMPRESSION: No acute cardiopulmonary process. Electronically Signed   By: Lovey Newcomer M.D.   On: 11/03/2017 10:28    Procedures Procedures (including critical care time)  Medications Ordered in ED Medications  potassium chloride SA (K-DUR,KLOR-CON) CR tablet 40 mEq (40 mEq Oral Given 11/03/17 1239)     Initial Impression / Assessment and Plan / ED Course  I have reviewed the triage vital signs and the nursing notes.  Pertinent labs & imaging results that were available during my care of the patient were reviewed by me and considered in my medical decision making (see chart for details).    Patient's symptoms have completely resolved.  She does have low potassium.  Started on oral potassium replacement.  Likely having muscle spasms related to this.  Upon x2 is normal.  She has a normal d-dimer.  She is advised to follow-up closely with her primary physician in several days for repeat potassium testing and strict return precautions given.  Final Clinical Impressions(s) / ED Diagnoses   Final diagnoses:  Muscle spasm  Hypokalemia    ED Discharge Orders        Ordered    potassium chloride SA (K-DUR,KLOR-CON) 20 MEQ tablet  Daily     11/03/17 1611       Julianne Rice, MD 11/03/17 1616

## 2017-11-03 NOTE — ED Triage Notes (Signed)
Pt presents with complaints of left arm and back pain x 1 day. Denies any associated symptoms. Per Dr. Meda Coffee patient does not meet STEMI criteria but does have some EKG changes and she needs to be taken to the ER by [facility] staff.

## 2017-11-07 ENCOUNTER — Telehealth: Payer: Self-pay | Admitting: Emergency Medicine

## 2017-11-07 NOTE — Telephone Encounter (Signed)
Copied from [place] [id number]. Topic: General - Other >> Nov 07, 2017  1:38 PM Jacqueline Berry R wrote: Pt called in requesting an order of labs to be drawn

## 2017-11-08 NOTE — Telephone Encounter (Signed)
LVM for patient to call back and let us know what labs she is wanting. I do see she was recently seen at the ED so patient may need to have a follow up with PCP.

## 2017-11-09 ENCOUNTER — Encounter: Payer: Self-pay | Admitting: Internal Medicine

## 2017-11-16 ENCOUNTER — Encounter: Payer: Self-pay | Admitting: Internal Medicine

## 2017-11-16 ENCOUNTER — Ambulatory Visit (INDEPENDENT_AMBULATORY_CARE_PROVIDER_SITE_OTHER): Payer: Medicare Other | Admitting: Internal Medicine

## 2017-11-16 ENCOUNTER — Other Ambulatory Visit (INDEPENDENT_AMBULATORY_CARE_PROVIDER_SITE_OTHER): Payer: Medicare Other

## 2017-11-16 VITALS — BP 140/84 | HR 80 | Temp 98.4°F | Ht 63.0 in | Wt 199.0 lb

## 2017-11-16 DIAGNOSIS — E876 Hypokalemia: Secondary | ICD-10-CM | POA: Diagnosis not present

## 2017-11-16 LAB — COMPREHENSIVE METABOLIC PANEL
ALK PHOS: 78 U/L (ref 39–117)
ALT: 18 U/L (ref 0–35)
AST: 14 U/L (ref 0–37)
Albumin: 3.8 g/dL (ref 3.5–5.2)
BILIRUBIN TOTAL: 0.5 mg/dL (ref 0.2–1.2)
BUN: 12 mg/dL (ref 6–23)
CALCIUM: 9.3 mg/dL (ref 8.4–10.5)
CO2: 31 meq/L (ref 19–32)
Chloride: 107 mEq/L (ref 96–112)
Creatinine, Ser: 0.82 mg/dL (ref 0.40–1.20)
GFR: 92.1 mL/min (ref >60.00)
Glucose, Bld: 104 mg/dL — ABNORMAL HIGH (ref 70–99)
Potassium: 3.1 mEq/L — ABNORMAL LOW (ref 3.5–5.1)
Sodium: 145 mEq/L (ref 135–145)
TOTAL PROTEIN: 7 g/dL (ref 6.0–8.3)

## 2017-11-16 NOTE — Assessment & Plan Note (Signed)
Checking BMP and adjust as needed. Likely due to diarrhea. Muscle spasms have since resolved. We talked about how this could be related to her lisinopril/hctz and if persistently low we may need to change medication or adjust dosing.

## 2017-11-16 NOTE — Patient Instructions (Signed)
We will check the labs today.  If the potassium is running low still we may need to adjust the dose of the blood pressure medicine and will call you to let you know.

## 2017-11-16 NOTE — Progress Notes (Signed)
   Subjective:    Patient ID: Jacqueline Berry, female    DOB: [birth year], 57 y.o.   MRN: [id number]  HPI The patient is a 57 YO female coming in for ER follow up (in for muscle pain and low K level, given some K in the ER and prescription). She is still having some muscle pain in her neck. Did notice a difference with taking her potassium. Denies fevers or chills. Denies chest pains or SOB. Was having diarrhea prior to the muscle cramps. Has had resolution of that and now denies abdominal problems including diarrhea, nausea, vomiting. Still taking medications as prescribed. Did not end up starting chantix and is still smoking.   Review of Systems  Constitutional: Negative.   HENT: Negative.   Eyes: Negative.   Respiratory: Negative for cough, chest tightness and shortness of breath.   Cardiovascular: Negative for chest pain, palpitations and leg swelling.  Gastrointestinal: Negative for abdominal distention, abdominal pain, constipation, diarrhea, nausea and vomiting.  Musculoskeletal: Negative.   Skin: Negative.   Neurological: Negative.   Psychiatric/Behavioral: Negative.       Objective:   Physical Exam  Constitutional: She is oriented to person, place, and time. She appears well-developed and well-nourished.  HENT:  Head: Normocephalic and atraumatic.  Eyes: EOM are normal.  Neck: Normal range of motion.  Cardiovascular: Normal rate and regular rhythm.  Pulmonary/Chest: Effort normal and breath sounds normal. No respiratory distress. She has no wheezes. She has no rales.  Abdominal: Soft. Bowel sounds are normal. She exhibits no distension. There is no tenderness. There is no rebound.  Musculoskeletal: She exhibits no edema.  Neurological: She is alert and oriented to person, place, and time. Coordination normal.  Skin: Skin is warm and dry.  Psychiatric: She has a normal mood and affect.   Vitals:   11/16/17 0956  BP: 140/84  Pulse: 80  Temp: 98.4 F (36.9 C)  TempSrc:  Oral  SpO2: 97%  Weight: 199 lb (90.3 kg)  Height: 5\' 3"  (1.6 m)      Assessment & Plan:

## 2017-11-17 MED ORDER — POTASSIUM CHLORIDE ER 10 MEQ PO CPCR: 10.0000 meq | ORAL_CAPSULE | Freq: Every day | ORAL | 0 refills | Status: DC

## 2017-11-17 MED ORDER — LISINOPRIL-HYDROCHLOROTHIAZIDE 20-12.5 MG PO TABS: 1.0000 | ORAL_TABLET | Freq: Every day | ORAL | 3 refills | Status: DC

## 2017-11-17 NOTE — Progress Notes (Signed)
Both would be best  Ok to d/c the lisinopril HCT 20-25 mg  Ok to start the lisinopril HCT 20-12.5 - 1 per day  OK for potassium 10 qd for 5 days  F/u Dr Sharlet Salina in 1-2 weeks for f/u BP and low K after med changes

## 2017-12-11 ENCOUNTER — Other Ambulatory Visit: Payer: Self-pay | Admitting: Internal Medicine

## 2017-12-11 DIAGNOSIS — E039 Hypothyroidism, unspecified: Secondary | ICD-10-CM

## 2018-02-21 ENCOUNTER — Other Ambulatory Visit (INDEPENDENT_AMBULATORY_CARE_PROVIDER_SITE_OTHER): Payer: Medicare Other

## 2018-02-21 ENCOUNTER — Ambulatory Visit (INDEPENDENT_AMBULATORY_CARE_PROVIDER_SITE_OTHER): Payer: Medicare Other | Admitting: Internal Medicine

## 2018-02-21 ENCOUNTER — Encounter: Payer: Self-pay | Admitting: Internal Medicine

## 2018-02-21 VITALS — BP 144/96 | HR 75 | Temp 98.7°F | Ht 63.0 in | Wt 199.0 lb

## 2018-02-21 DIAGNOSIS — E876 Hypokalemia: Secondary | ICD-10-CM

## 2018-02-21 DIAGNOSIS — I10 Essential (primary) hypertension: Secondary | ICD-10-CM

## 2018-02-21 LAB — BASIC METABOLIC PANEL
BUN: 8 mg/dL (ref 6–23)
CO2: 30 meq/L (ref 19–32)
CREATININE: 0.81 mg/dL (ref 0.40–1.20)
Calcium: 9.5 mg/dL (ref 8.4–10.5)
Chloride: 108 mEq/L (ref 96–112)
GFR: 93.33 mL/min (ref >60.00)
GLUCOSE: 100 mg/dL — AB (ref 70–99)
Potassium: 3.4 mEq/L — ABNORMAL LOW (ref 3.5–5.1)
Sodium: 143 mEq/L (ref 135–145)

## 2018-02-21 MED ORDER — AMLODIPINE BESYLATE 10 MG PO TABS: 10.0000 mg | ORAL_TABLET | Freq: Every day | ORAL | 3 refills | Status: DC

## 2018-02-21 NOTE — Patient Instructions (Signed)
We are going to check the labs today to make sure the potassium levels are back to normal.   We have sent in a new blood pressure medicine called amlodipine to take 1 pill daily and the lisinopril/hctz 20/12.5 mg 1 pill daily that you already have.

## 2018-02-21 NOTE — Progress Notes (Signed)
   Subjective:    Patient ID: Jacqueline Berry, female    DOB: 1960/10/13, 57 y.o.   MRN: [id number]  HPI The patient is a 57 YO female coming in for blood pressure follow up. She never came back for follow up after decreasing dose from 20/25 lisinopril/hctz to 20/12.5 after persistently low K levels. She then was seen last week at psych office and BP was 160/110 and she was told to come back. She has been feeling well overall. Denies headaches, chest pains, SOB. She does take her medicine everyday and make the change back in August to the lower dose medicine. Not taking potassium anymore and no muscle cramps.  Review of Systems  Constitutional: Negative.   HENT: Negative.   Eyes: Negative.   Respiratory: Negative for cough, chest tightness and shortness of breath.   Cardiovascular: Negative for chest pain, palpitations and leg swelling.  Gastrointestinal: Negative for abdominal distention, abdominal pain, constipation, diarrhea, nausea and vomiting.  Musculoskeletal: Negative.   Skin: Negative.   Neurological: Negative.   Psychiatric/Behavioral: Negative.       Objective:   Physical Exam  Constitutional: She is oriented to person, place, and time. She appears well-developed and well-nourished.  HENT:  Head: Normocephalic and atraumatic.  Eyes: EOM are normal.  Neck: Normal range of motion.  Cardiovascular: Normal rate and regular rhythm.  Pulmonary/Chest: Effort normal and breath sounds normal. No respiratory distress. She has no wheezes. She has no rales.  Abdominal: Soft. Bowel sounds are normal. She exhibits no distension. There is no tenderness. There is no rebound.  Musculoskeletal: She exhibits no edema.  Neurological: She is alert and oriented to person, place, and time. Coordination normal.  Skin: Skin is warm and dry.   Vitals:   02/21/18 1328  BP: (!) 144/96  Pulse: 75  Temp: 98.7 F (37.1 C)  TempSrc: Oral  SpO2: 97%  Weight: 199 lb (90.3 kg)  Height: 5\' 3"  (1.6 m)       Assessment & Plan:

## 2018-02-21 NOTE — Assessment & Plan Note (Signed)
Checking BMP again as she did not follow up on low levels previously.

## 2018-02-21 NOTE — Assessment & Plan Note (Signed)
BP is moderately elevated with decreased dosing. Will continue lisinopril/hctz 20/12.5 mg daily and add amlodipine 10 mg daily. Checking BMP today for potassium levels.

## 2018-02-23 ENCOUNTER — Emergency Department (HOSPITAL_COMMUNITY)
Admission: EM | Admit: 2018-02-23 | Discharge: 2018-02-23 | Disposition: A | Discharge status: Home / Self Care | Payer: Medicare Other | Attending: Emergency Medicine | Admitting: Emergency Medicine

## 2018-02-23 ENCOUNTER — Encounter (HOSPITAL_COMMUNITY): Payer: Self-pay | Admitting: Emergency Medicine

## 2018-02-23 DIAGNOSIS — F17210 Nicotine dependence, cigarettes, uncomplicated: Secondary | ICD-10-CM | POA: Diagnosis not present

## 2018-02-23 DIAGNOSIS — I10 Essential (primary) hypertension: Secondary | ICD-10-CM | POA: Diagnosis not present

## 2018-02-23 DIAGNOSIS — Z79899 Other long term (current) drug therapy: Secondary | ICD-10-CM | POA: Diagnosis not present

## 2018-02-23 DIAGNOSIS — S01111A Laceration without foreign body of right eyelid and periocular area, initial encounter: Secondary | ICD-10-CM | POA: Diagnosis not present

## 2018-02-23 DIAGNOSIS — Y999 Unspecified external cause status: Secondary | ICD-10-CM | POA: Insufficient documentation

## 2018-02-23 DIAGNOSIS — W19XXXA Unspecified fall, initial encounter: Secondary | ICD-10-CM | POA: Diagnosis not present

## 2018-02-23 DIAGNOSIS — Z23 Encounter for immunization: Secondary | ICD-10-CM | POA: Diagnosis not present

## 2018-02-23 DIAGNOSIS — W101XXA Fall (on)(from) sidewalk curb, initial encounter: Secondary | ICD-10-CM | POA: Diagnosis not present

## 2018-02-23 DIAGNOSIS — Y939 Activity, unspecified: Secondary | ICD-10-CM | POA: Diagnosis not present

## 2018-02-23 DIAGNOSIS — R58 Hemorrhage, not elsewhere classified: Secondary | ICD-10-CM | POA: Diagnosis not present

## 2018-02-23 DIAGNOSIS — E039 Hypothyroidism, unspecified: Secondary | ICD-10-CM | POA: Diagnosis not present

## 2018-02-23 DIAGNOSIS — Z743 Need for continuous supervision: Secondary | ICD-10-CM | POA: Diagnosis not present

## 2018-02-23 DIAGNOSIS — Y929 Unspecified place or not applicable: Secondary | ICD-10-CM | POA: Insufficient documentation

## 2018-02-23 MED ORDER — TETANUS-DIPHTH-ACELL PERTUSSIS 5-2.5-18.5 LF-MCG/0.5 IM SUSP
0.5000 mL | Freq: Once | INTRAMUSCULAR | Status: AC
  Administered 2018-02-23: 0.5 mL via INTRAMUSCULAR
  Filled 2018-02-23: qty 0.5

## 2018-02-23 NOTE — ED Provider Notes (Signed)
[facility] EMERGENCY DEPARTMENT Provider Note   CSN: [id number] Arrival date & time: 02/23/18  0156     History   Chief Complaint Chief Complaint  Patient presents with  . Fall  . Laceration    HPI Jacqueline Berry is a 57 y.o. female.  Patient presents to the emergency department with a chief complaint of fall.  She states that she tripped on a curb and lost her balance falling and striking her head.  She sustained a laceration over her right eyebrow.  She did not pass out.  She denies any vision changes, pain with eye movement, nausea, vomiting, amnesia, or seizures.  She has not taken anything prior to arrival.  Last tetanus shot is unknown.  She is not anticoagulated.  The history is provided by the patient. No language interpreter was used.    Past Medical History:  Diagnosis Date  . Bipolar disorder ([place])   . Breast cancer ([place])    with chemo - 1997  . Hypertension   . Syphilis   . Thyroid disease   . Thyroid disease     Patient Active Problem List   Diagnosis Date Noted  . Hypokalemia 11/16/2017  . Urinary incontinence 09/30/2017  . Routine general medical examination at a health care facility 09/30/2017  . Paresthesia of both feet 10/17/2014  . Obesity 10/16/2009  . Hypothyroidism 07/11/2009  . Paranoid schizophrenia in remission ([place]) 06/24/2009  . TOBACCO ABUSE 06/24/2009  . Essential hypertension 06/24/2009  . HX, PERSONAL, MALIGNANCY, BREAST 12/07/2006    Past Surgical History:  Procedure Laterality Date  . BREAST BIOPSY  1997  . MASTECTOMY     left breat mastectomy 1997  . TUBAL LIGATION     1992     OB History    Gravida  5   Para  3   Term  3   Preterm      AB  2   Living  3     SAB      TAB  2   Ectopic      Multiple      Live Births               Home Medications    Prior to Admission medications   Medication Sig Start Date End Date Taking? Authorizing Provider  amLODipine (NORVASC) 10 MG  tablet Take 1 tablet (10 mg total) by mouth daily. 02/21/18   Hoyt Koch, MD  benztropine (COGENTIN) 0.5 MG tablet Take 0.5 mg by mouth daily.     [provider]  Chlorpheniramine Maleate (ALLERGY PO) Take 1 tablet by mouth daily.    [provider]  cyclobenzaprine (FLEXERIL) 5 MG tablet Take 1 tablet (5 mg total) by mouth 3 (three) times daily as needed for muscle spasms. 10/03/17   Jaynee Eagles, PA-C  haloperidol (HALDOL) 5 MG tablet Take 2.5 mg by mouth daily.     [provider]  haloperidol decanoate (HALDOL DECANOATE) 50 MG/ML injection Inject 100 mg into the muscle every 28 (twenty-eight) days.    [provider]  levothyroxine (SYNTHROID, LEVOTHROID) 100 MCG tablet TAKE 1 TABLET BY MOUTH EVERY DAY 12/12/17   Hoyt Koch, MD  lisinopril-hydrochlorothiazide (ZESTORETIC) 20-12.5 MG tablet Take 1 tablet by mouth daily. 11/17/17   Biagio Borg, MD  loratadine (ALLERGY) 10 MG tablet Take 10 mg by mouth daily.    [provider]    Family History Family History  Problem Relation  Age of Onset  . Stroke Father   . Diabetes Brother   . Coronary artery disease Brother   . Cancer Mother   . Breast cancer Sister   . Alcohol abuse Other   . Arthritis Other   . Lung cancer Other   . Heart disease Other   . Sudden death Unknown   . Diabetes Other     Social History Social History   Tobacco Use  . Smoking status: Current Every Day Smoker    Packs/day: 0.50    Years: 37.00    Pack years: 18.50    Types: Cigarettes  . Smokeless tobacco: Never Used  Substance Use Topics  . Alcohol use: No    Comment: Pt denies  . Drug use: No    Comment: Pt denies      Allergies   Latex   Review of Systems Review of Systems  All other systems reviewed and are negative.    Physical Exam Updated Vital Signs BP (!) 178/107 (BP Location: Right Arm)   Pulse 64   Temp 98.1 F (36.7 C) (Oral)   Resp 18   Ht 5\' 2"  (1.575 m)    LMP 02/18/2011   SpO2 97%   BMI 36.40 kg/m   Physical Exam  Constitutional: She is oriented to person, place, and time. She appears well-developed and well-nourished.  HENT:  Head: Normocephalic and atraumatic.  3 cm laceration over right eyebrow, no foreign body, bleeding controlled  Eyes: Pupils are equal, round, and reactive to light. Conjunctivae and EOM are normal.  Neck: Normal range of motion. Neck supple.  Cardiovascular: Normal rate and regular rhythm. Exam reveals no gallop and no friction rub.  No murmur heard. Pulmonary/Chest: Effort normal and breath sounds normal. No respiratory distress. She has no wheezes. She has no rales. She exhibits no tenderness.  Abdominal: Soft. Bowel sounds are normal. She exhibits no distension and no mass. There is no tenderness. There is no rebound and no guarding.  Musculoskeletal: Normal range of motion. She exhibits no edema or tenderness.  Neurological: She is alert and oriented to person, place, and time.  Skin: Skin is warm and dry.  Psychiatric: She has a normal mood and affect. Her behavior is normal. Judgment and thought content normal.  Nursing note and vitals reviewed.    ED Treatments / Results  Labs (all labs ordered are listed, but only abnormal results are displayed) Labs Reviewed - No data to display  EKG None  Radiology No results found.  Procedures Procedures (including critical care time) LACERATION REPAIR Performed by: Mahalia Longest PA-S Authorized by: Montine Circle Consent: Verbal consent obtained. Risks and benefits: risks, benefits and alternatives were discussed Consent given by: patient Patient identity confirmed: provided demographic data Prepped and Draped in normal sterile fashion Wound explored  Laceration Location: right eyebrow  Laceration Length: 3cm  No Foreign Bodies seen or palpated  Anesthesia: local infiltration  Local anesthetic: lidocaine 1% with epinephrine  Anesthetic total:  3 ml  Irrigation method: syringe Amount of cleaning: standard  Skin closure: 6-0 prolene  Number of sutures: 7  Technique: interrupted  Patient tolerance: Patient tolerated the procedure well with no immediate complications.  Medications Ordered in ED Medications - No data to display   Initial Impression / Assessment and Plan / ED Course  I have reviewed the triage vital signs and the nursing notes.  Pertinent labs & imaging results that were available during my care of the patient were reviewed by me  and considered in my medical decision making (see chart for details).     Patient with fall and laceration.  She did not pass out.  No vomiting, seizures, or amnesia.  No tenderness to palpation or evidence of skull fracture.  Do not feel the patient warrants advanced imaging at this time.  Will repair laceration in the ED.  Will update tetanus shot.  Recommend follow-up with PCP for suture removal in 5 days.  Return precautions given.  Final Clinical Impressions(s) / ED Diagnoses   Final diagnoses:  Laceration of right eyebrow, initial encounter    ED Discharge Orders    None       Montine Circle, PA-C 20/25/42 [id number]    Delora Fuel, MD 37/62/83 2248

## 2018-02-23 NOTE — ED Triage Notes (Signed)
  Patient was at work and walking around outside.  It was dark and she tripped and hit her head on a guardrail approximately two hours ago.  No LOC.  Patient has 1.5 inch laceration on R eyebrow.  Bleeding controlled.  No pain.  Patient A&O x4.

## 2018-02-28 ENCOUNTER — Ambulatory Visit (INDEPENDENT_AMBULATORY_CARE_PROVIDER_SITE_OTHER): Payer: Medicare Other | Admitting: Internal Medicine

## 2018-02-28 ENCOUNTER — Encounter: Payer: Self-pay | Admitting: Internal Medicine

## 2018-02-28 VITALS — BP 120/84 | HR 68 | Temp 97.8°F | Ht 62.0 in | Wt 196.0 lb

## 2018-02-28 DIAGNOSIS — G44309 Post-traumatic headache, unspecified, not intractable: Secondary | ICD-10-CM | POA: Diagnosis not present

## 2018-02-28 DIAGNOSIS — F200 Paranoid schizophrenia: Secondary | ICD-10-CM

## 2018-02-28 DIAGNOSIS — Z124 Encounter for screening for malignant neoplasm of cervix: Secondary | ICD-10-CM

## 2018-02-28 DIAGNOSIS — R519 Headache, unspecified: Secondary | ICD-10-CM | POA: Insufficient documentation

## 2018-02-28 DIAGNOSIS — R51 Headache: Secondary | ICD-10-CM

## 2018-02-28 NOTE — Progress Notes (Signed)
   Subjective:    Patient ID: Jacqueline Berry, female    DOB: 17-Mar-1961, 56 y.o.   MRN: [id number]  HPI The patient is a 57 YO female coming in for stitch removal and pain from fall. She denies LOC or headaches. She is having some auditory hallucinations which she had last week. She got a recent injection from her mental health specialist and the voices went away a few days later. Threatening talk but not asking her to do anything. Denies visual hallucinations. Denies any SI/HI. Denies current problems. [place] with tripping on a curb in the dark and hitting a guardrail with laceration to right eyebrow.   Review of Systems  Constitutional: Negative.   HENT: Negative.   Eyes: Negative.   Respiratory: Negative for cough, chest tightness and shortness of breath.   Cardiovascular: Negative for chest pain, palpitations and leg swelling.  Gastrointestinal: Negative for abdominal distention, abdominal pain, constipation, diarrhea, nausea and vomiting.  Musculoskeletal: Negative.   Skin: Negative.   Neurological: Negative.   Psychiatric/Behavioral: Positive for hallucinations.      Objective:   Physical Exam  Constitutional: She is oriented to person, place, and time. She appears well-developed and well-nourished.  HENT:  Head: Normocephalic and atraumatic.  7 stitches removed from right eyebrow, good healing and no signs of infection or rash, no bruising on the head  Eyes: Pupils are equal, round, and reactive to light. EOM are normal.  Neck: Normal range of motion.  Cardiovascular: Normal rate and regular rhythm.  Pulmonary/Chest: Effort normal and breath sounds normal. No respiratory distress. She has no wheezes. She has no rales.  Abdominal: Soft. Bowel sounds are normal. She exhibits no distension. There is no tenderness. There is no rebound.  Musculoskeletal: She exhibits no edema.  Neurological: She is alert and oriented to person, place, and time. Coordination normal.  Skin: Skin is warm  and dry.   Vitals:   02/28/18 1509  BP: 120/84  Pulse: 68  Temp: 97.8 F (36.6 C)  TempSrc: Oral  SpO2: 98%  Weight: 196 lb (88.9 kg)  Height: 5\' 2"  (1.575 m)      Assessment & Plan:

## 2018-02-28 NOTE — Assessment & Plan Note (Signed)
She is having recent auditory hallucinations which were resolved several days after some shot with her psych provider. Is stable at this time but has been awhile since auditory hallucinations and she is asked to mention this to her mental health provider at next visit.

## 2018-02-28 NOTE — Assessment & Plan Note (Signed)
Right eyebrow with some pain but tolerated stitch removal well. Healing appropriately and all 7 stitches removed matching ER note for placement.

## 2018-02-28 NOTE — Patient Instructions (Signed)
We will have them call you to go in for pap smear.   You can use a band aid on the eye brow if needed otherwise just clean with soapy water and gentle wash cloth.

## 2018-03-03 ENCOUNTER — Other Ambulatory Visit: Payer: Self-pay

## 2018-03-03 NOTE — Patient Outreach (Signed)
[facility]) Care Management  84/06/7541  Jacqueline Berry [birth year] [id number]   Telephone Screen  Referral Date: 03/03/18 Referral Source:  [facility] Dept. Referral Reason: " member expressed concern for co-pay assistance for haloperidol dacanoate injection-once monthly" Insurance: [facility] Medicare   Incoming call from patient. Spoke with patient and discussed referral reason. She states that she is having about $41/month for monthly injections. She reports that she is not currently out of med. Patient voices that she is taking about seven meds total. She reports that she was told she could be possibly eligible for med assistance and wanted to look into this further. She voices that her injection is the most expensive med she is taking. She has not discussed not being able to afford med with MD but encouraged to do so. RN CM discussed [facility] pharmacy referral and possible assistance and explained referral process. She is interested in seeing if she is eligible for any type of assistance with her meds especially this one. Patient reports that she is independent with ADLs/IADLs. She is able to manage her care in the home. Per chart review, patient has PMH of bipolar disorder, breast CA s/p chemo and mastectomy(1997), HTN and thyroid disease. She denies needing any other education and/or support in managing chronic conditions. She was in the ED on 02/23/18 after accidentally "tripping on curb" and falling and sustaining a laceration to right eye. [facility] services reviewed and discussed with patient. Verbal consent for services given by patient and voices only needing pharmacy assistance at this time.       Plan: RN CM will send [facility] pharmacy referral for possible med assistance.  Enzo Montgomery, RN,BSN,CCM [place] Management Telephonic Care Management Coordinator Direct Phone: [phone] Toll Free: [phone] Fax: [phone]

## 2018-03-03 NOTE — Patient Outreach (Signed)
[facility]) Care Management  70/96/2836  Jacqueline Berry [birth year] [id number]   Telephone Screen  Referral Date: 03/03/18 Referral Source:  [facility] Dept. Referral Reason: " member expressed concern for co-pay assistance for haloperidol dacanoate injection-once monthly" Insurance: [facility] Medicare   Outreach attempt # 1 to patient. No answer at present. RN CM left HIPAA compliant voicemail message along with contact info.     Plan: RN CM will make outreach attempt to patient within 3-4 business days.  RN CM will send unsuccessful outreach letter to patient.    Enzo Montgomery, RN,BSN,CCM [place] Management Telephonic Care Management Coordinator Direct Phone: [phone] Toll Free: [phone] Fax: [phone]

## 2018-03-06 ENCOUNTER — Other Ambulatory Visit: Payer: Self-pay | Admitting: Pharmacist

## 2018-03-06 NOTE — Patient Outreach (Addendum)
[facility]) Care Management  [place]  32/99/2426  Jacqueline Berry 8/34/1963 [id number]   Reason for referral: Medication assistance with haloperidol injection (IM)  Referral source: [facility] department Current insurance:  [facility] Medicare  Unsuccessful telephone call attempt #1 to patient.   HIPAA compliant voicemail left requesting a return call  Plan:  Noted that unsuccessful outreach letter already mailed to patient on 03/03/2018.  Will f/u with patient again within 3-4 business days if I have not heard back yet.   Ralene Bathe, PharmD, [place] [phone]

## 2018-03-07 ENCOUNTER — Other Ambulatory Visit: Payer: Self-pay | Admitting: Pharmacist

## 2018-03-07 NOTE — Patient Outreach (Signed)
[facility]) Care Management  [place]   76/54/6503  Jacqueline Berry [birth year] [id number]  Reason for referral: medication assistance   Referral source: [facility] department Referral medication(s): Haloperidol IM injection Current insurance:[employer] Medicare Complete Plan 2 (HMO)  PMHx: paranoid schizophrenia in remission, HTN  Outreach: Incoming call and voicemail received from Ms. Mazzaferro.  Return call placed to patient.  HIPAA identifiers verified. Patient reports she pays $41 / month for haloperidol decanoate.  She states she applied for Extra Help 4-5 years ago but was denied due to income.  She states she fills haloperidol at [facility].  She is also interested in assistance for Chantix although she is not currently prescribed this medication due to cost. Patient inquires about dental resources in the area.     Objective: Allergies  Allergen Reactions  . Latex     REACTION: itchiong    Medications Reviewed Today    Reviewed by Hoyt Koch, MD (Physician) on 02/28/18 at 1532  Med List Status: <None>  Medication Order Taking? Sig Documenting Provider Last Dose Status Informant  amLODipine (NORVASC) 10 MG tablet [id number] Yes Take 1 tablet (10 mg total) by mouth daily. Hoyt Koch, MD Taking Active   benztropine (COGENTIN) 0.5 MG tablet [id number] Yes Take 0.5 mg by mouth daily.  [provider] Taking Active Self  Chlorpheniramine Maleate (ALLERGY PO) [id number] Yes Take 1 tablet by mouth daily. [provider] Taking Active Self  cyclobenzaprine (FLEXERIL) 5 MG tablet [id number] Yes Take 1 tablet (5 mg total) by mouth 3 (three) times daily as needed for muscle spasms. Jaynee Eagles, PA-C Taking Active Self  haloperidol (HALDOL) 5 MG tablet [id number] Yes Take 2.5 mg by mouth daily.  [provider] Taking Active Self  haloperidol decanoate (HALDOL DECANOATE) 50 MG/ML injection [id number] Yes Inject 100 mg into the muscle  every 28 (twenty-eight) days. [provider] Taking Active Self  levothyroxine (SYNTHROID, LEVOTHROID) 100 MCG tablet [id number] Yes TAKE 1 TABLET BY MOUTH EVERY DAY Hoyt Koch, MD Taking Active   lisinopril-hydrochlorothiazide (ZESTORETIC) 20-12.5 MG tablet [id number] Yes Take 1 tablet by mouth daily. Biagio Borg, MD Taking Active   loratadine (ALLERGY) 10 MG tablet [id number] Yes Take 10 mg by mouth daily. [provider] Taking Active Self         ASSESSMENT: Date Discharged from Hospital: 02/23/2018 Date Medication Reconciliation Performed: 03/08/2018   Medications Discontinued at Discharge:   None  New Medications at Discharge:   None  Medications with Dose Adjustments at Discharge: . None  Patient was recently discharged from hospital and all medications have been reviewed.  Drugs sorted by system:  Neurologic/Psychologic: benztropine, haloperidol PO and IM  Cardiovascular: amlodipine, lisinopril-HCTZ,   Pulmonary/Allergy: loratadine  Endocrine: levothyroxine  Medication Review Findings:  . Benzotropine + loratadine both have anticholinergic properties, caution against using concomitantly long-term. Consider trial off loratadine or reduced frequency.    Medication Assistance Findings:  Extra Help:   '[]'  Already receiving Full Extra Help  '[]'  Already receiving Partial Extra Help  '[]'  Eligible based on reported income and assets  '[x]'  Not Eligible based on reported income and assets  Patient Assistance Programs:      1) Haloperidol: No patient assistance program available through [employer].  No reduced cost via mail order or via other pharmacy discount programs.          2)  Chantix made by [employer] o Income requirement met: '[x]'  Yes '[]'  No  '[]'   Unknown o Out-of-pocket prescription expenditure met:   '[]'  Yes '[]'  No   '[]'  Unknown '[x]'  Not applicable - Unclear eligibility as program states patient must have no RX coverage or "not enough  coverage." - Reviewed application with patient.  She requests an emailed copy.  She will contact me if she would like assistance applying in the future if she starts taking it.    Additional medication assistance options reviewed with patient as warranted:  No other options identified  Plan: Will email Chantix PAP to patient.  Will place [facility] SW referral for dental resources Will close case at no further [place] needs at this time.   Ralene Bathe, PharmD, [place] [phone]

## 2018-03-09 ENCOUNTER — Other Ambulatory Visit: Payer: Self-pay

## 2018-03-09 ENCOUNTER — Ambulatory Visit: Payer: Self-pay | Admitting: Pharmacist

## 2018-03-09 NOTE — Patient Outreach (Signed)
[facility]) Care Management  50/72/2575  Jacqueline Berry [birth year] [id number]  BSW attempted to contact the patient on today's date to conduct a community resource consult. Unfortunately, today's call was unsuccessful. BSW left the patient a HIPAA compliant voice message requesting a return call.   Plan: BSW will mail the patient an unsuccessful outreach letter. BSW will attempt the patient again within the next four business days.  Daneen Schick, BSW, CDP [facility] [phone]

## 2018-03-14 ENCOUNTER — Other Ambulatory Visit: Payer: Self-pay

## 2018-03-14 ENCOUNTER — Ambulatory Visit: Payer: Self-pay

## 2018-03-14 NOTE — Patient Outreach (Signed)
[facility]) Care Management  16/01/9603  DEARIA WILMOUTH [birth year] [id number]  Incoming call from the patient on today's date, HIPAA identifiers confirmed. BSW introduced self to the patient and the reason for previous outreach, indicating the patient had been referred to this BSW for assistance with dental resources. The patient denied the need for these resources.  BSW to close case as the patient declines assistance.  Daneen Schick, BSW, CDP [facility] [phone]

## 2018-03-14 NOTE — Patient Outreach (Signed)
[facility]) Care Management  19/16/6060  Jacqueline Berry [birth year] [id number]  Second unsuccessful outreach to the patient on today's date. BSW left a HIPAA compliant voice message requesting a return call.   Plan: BSW to outreach the patient within the next four business days.  Daneen Schick, BSW, CDP [facility] [phone]

## 2018-03-15 DIAGNOSIS — H2513 Age-related nuclear cataract, bilateral: Secondary | ICD-10-CM | POA: Diagnosis not present

## 2018-03-21 ENCOUNTER — Ambulatory Visit: Payer: Self-pay

## 2018-03-30 ENCOUNTER — Encounter: Payer: Self-pay | Admitting: Gynecology

## 2018-03-30 ENCOUNTER — Ambulatory Visit (INDEPENDENT_AMBULATORY_CARE_PROVIDER_SITE_OTHER): Payer: Medicare Other | Admitting: Gynecology

## 2018-03-30 VITALS — BP 124/84 | Ht 63.5 in | Wt 196.0 lb

## 2018-03-30 DIAGNOSIS — Z01419 Encounter for gynecological examination (general) (routine) without abnormal findings: Secondary | ICD-10-CM

## 2018-03-30 DIAGNOSIS — Z853 Personal history of malignant neoplasm of breast: Secondary | ICD-10-CM

## 2018-03-30 DIAGNOSIS — N952 Postmenopausal atrophic vaginitis: Secondary | ICD-10-CM | POA: Diagnosis not present

## 2018-03-30 NOTE — Patient Instructions (Signed)
Follow-up for the bone density as scheduled. 

## 2018-03-30 NOTE — Addendum Note (Signed)
Addended by: Nelva Nay on: 03/30/2018 01:08 PM   Modules accepted: Orders

## 2018-03-30 NOTE — Progress Notes (Signed)
    Jacqueline Berry [birth year] [id number]        57 y.o.  [id number] new patient for annual gynecologic exam.  Without gynecologic complaints.   Past medical history,surgical history, problem list, medications, allergies, family history and social history were all reviewed and documented as reviewed in the EPIC chart.  ROS:  Performed with pertinent positives and negatives included in the history, assessment and plan.   Additional significant findings : None   Exam: Caryn Bee assistant Vitals:   03/30/18 1226  BP: 124/84  Weight: 196 lb (88.9 kg)  Height: 5' 3.5" (1.613 m)   Body mass index is 34.18 kg/m.  General appearance:  Normal affect, orientation and appearance. Skin: Grossly normal HEENT: Without gross lesions.  No cervical or supraclavicular adenopathy. Thyroid normal.  Lungs:  Clear without wheezing, rales or rhonchi Cardiac: RR, without RMG Abdominal:  Soft, nontender, without masses, guarding, rebound, organomegaly or hernia Breasts:  Examined lying and sitting.  Right breast without masses, retractions, discharge or axillary adenopathy.  Left chest wall status post mastectomy without masses or adenopathy Pelvic:  Ext, BUS, Vagina: Normal with atrophic changes  Cervix: Normal with atrophic changes.  Pap smear done  Uterus: Anteverted, normal size, shape and contour, midline and mobile nontender   Adnexa: Without masses or tenderness    Anus and perineum: Normal   Rectovaginal: Normal sphincter tone without palpated masses or tenderness.    Assessment/Plan:  57 y.o. [id number] female for annual gynecologic exam.   1. Postmenopausal/atrophic genital changes.  No significant menopausal symptoms or any vaginal bleeding. 2. History of breast cancer status post left mastectomy.  Mammography 05/2017.  Continue with annual mammography when due.  Exam NED. 3. Pap smear 2015.  Pap smear done today.  No history of abnormal Pap smears. 4. Colonoscopy at age 74.  Was given  Cologuard by her primary.  Recommended patient consider colonoscopy and schedule. 5. DEXA never.  Recommend baseline DEXA now given history of breast cancer with chemotherapy.  Patient will schedule in follow-up for this. 6. Health maintenance.  No routine lab work done as patient does this elsewhere.  Follow-up for bone density.  Follow-up in 1 year for annual exam.   Anastasio Auerbach MD, 12:45 PM 03/30/2018

## 2018-03-31 LAB — PAP IG W/ RFLX HPV ASCU

## 2018-05-16 ENCOUNTER — Other Ambulatory Visit: Payer: Self-pay | Admitting: Internal Medicine

## 2018-05-16 DIAGNOSIS — Z1231 Encounter for screening mammogram for malignant neoplasm of breast: Secondary | ICD-10-CM

## 2018-05-17 ENCOUNTER — Ambulatory Visit: Payer: Self-pay | Admitting: Internal Medicine

## 2018-06-13 ENCOUNTER — Other Ambulatory Visit: Payer: Self-pay | Admitting: Internal Medicine

## 2018-06-13 ENCOUNTER — Ambulatory Visit
Admission: RE | Admit: 2018-06-13 | Discharge: 2018-06-13 | Disposition: A | Discharge status: Home / Self Care | Payer: Medicare Other | Source: Ambulatory Visit | Attending: Internal Medicine | Admitting: Internal Medicine

## 2018-06-13 DIAGNOSIS — Z1231 Encounter for screening mammogram for malignant neoplasm of breast: Secondary | ICD-10-CM | POA: Diagnosis not present

## 2018-06-22 ENCOUNTER — Ambulatory Visit (INDEPENDENT_AMBULATORY_CARE_PROVIDER_SITE_OTHER): Payer: Medicare Other | Admitting: Internal Medicine

## 2018-06-22 ENCOUNTER — Encounter: Payer: Self-pay | Admitting: Internal Medicine

## 2018-06-22 ENCOUNTER — Ambulatory Visit: Payer: Self-pay | Admitting: Internal Medicine

## 2018-06-22 VITALS — BP 118/70 | HR 64 | Temp 97.4°F | Ht 63.5 in | Wt 190.0 lb

## 2018-06-22 DIAGNOSIS — R399 Unspecified symptoms and signs involving the genitourinary system: Secondary | ICD-10-CM | POA: Insufficient documentation

## 2018-06-22 LAB — POCT URINALYSIS DIPSTICK
Glucose, UA: NEGATIVE
Ketones, UA: NEGATIVE
NITRITE UA: POSITIVE
PH UA: 6 (ref 5.0–8.0)
Protein, UA: POSITIVE — AB
Spec Grav, UA: 1.025 (ref 1.010–1.025)
UROBILINOGEN UA: 0.2 U/dL

## 2018-06-22 MED ORDER — NITROFURANTOIN MONOHYD MACRO 100 MG PO CAPS: 100.0000 mg | ORAL_CAPSULE | Freq: Two times a day (BID) | ORAL | 0 refills | Status: DC

## 2018-06-22 NOTE — Progress Notes (Signed)
   Subjective:   Patient ID: Jacqueline Berry, female    DOB: [birth year], 59 y.o.   MRN: [id number]  HPI The patient is a 58 y.o. female coming in for urinary symptoms. Started 1-2 days ago. Main symptoms are: burning with urination and having to go more often. Denies fevers or chills or nausea or vomiting or stomach/back pain. Overall it is stable. Has tried increasing water which did not seem to help.   Review of Systems  Constitutional: Negative.   Respiratory: Negative.   Cardiovascular: Negative.   Gastrointestinal: Negative for abdominal distention, abdominal pain, constipation, diarrhea, nausea and vomiting.  Genitourinary: Positive for dysuria, frequency and urgency.  Musculoskeletal: Negative.   Skin: Negative.     Objective:  Physical Exam Constitutional:      Appearance: She is well-developed.  HENT:     Head: Normocephalic and atraumatic.  Neck:     Musculoskeletal: Normal range of motion.  Cardiovascular:     Rate and Rhythm: Normal rate and regular rhythm.  Pulmonary:     Effort: Pulmonary effort is normal. No respiratory distress.     Breath sounds: Normal breath sounds. No wheezing or rales.  Abdominal:     General: Bowel sounds are normal. There is no distension.     Palpations: Abdomen is soft.     Tenderness: There is no abdominal tenderness. There is no rebound.  Skin:    General: Skin is warm and dry.  Neurological:     Mental Status: She is alert and oriented to person, place, and time.     Coordination: Coordination normal.     Vitals:   06/22/18 1256  BP: 118/70  Pulse: 64  Temp: (!) 97.4 F (36.3 C)  TempSrc: Oral  SpO2: 99%  Weight: 190 lb (86.2 kg)  Height: 5' 3.5" (1.613 m)    Assessment & Plan:

## 2018-06-22 NOTE — Assessment & Plan Note (Signed)
POC U/A done in the office consistent with infection. Rx for macrobid and follow up as needed. Talked about increasing fluids as well.

## 2018-06-22 NOTE — Patient Instructions (Signed)
We have sent in nitrofurantoin (macrobid) to help the bladder infection. Take 1 pill twice a day for 1 week to clear the infection.

## 2018-08-17 ENCOUNTER — Telehealth: Payer: Self-pay | Admitting: Internal Medicine

## 2018-08-18 ENCOUNTER — Telehealth: Payer: Self-pay | Admitting: Internal Medicine

## 2018-08-18 MED ORDER — AMLODIPINE BESYLATE 10 MG PO TABS: 10.0000 mg | ORAL_TABLET | Freq: Every day | ORAL | 1 refills | Status: DC

## 2018-08-18 NOTE — Telephone Encounter (Signed)
Medication sent.

## 2018-08-18 NOTE — Telephone Encounter (Signed)
Patient calling to inquire why this medication was denied. She is requesting a call back.

## 2018-08-18 NOTE — Telephone Encounter (Signed)
Noted thanks °

## 2018-08-18 NOTE — Telephone Encounter (Signed)
Copied from [place] [id number]. Topic: Quick Communication - Rx Refill/Question >> Aug 18, 2018 10:09 AM Reyne Dumas L wrote: Medication: amLODipine (NORVASC) 10 MG tablet  Has the patient contacted their pharmacy? Yes - states she needs a new refill (Agent: If no, request that the patient contact the pharmacy for the refill.) (Agent: If yes, when and what did the pharmacy advise?)  Preferred Pharmacy (with phone number or street name): [id number] [place], [place] [phone] (Phone) [phone] (Fax)  Agent: Please be advised that RX refills may take up to 3 business days. We ask that you follow-up with your pharmacy.

## 2018-08-18 NOTE — Telephone Encounter (Signed)
Pharmacy made a mistake and missed one refill, spoke to patient and pharmacy, problem fixed and patient notitfied.

## 2018-09-07 ENCOUNTER — Other Ambulatory Visit: Payer: Self-pay | Admitting: Internal Medicine

## 2018-09-07 DIAGNOSIS — E039 Hypothyroidism, unspecified: Secondary | ICD-10-CM

## 2018-11-11 ENCOUNTER — Other Ambulatory Visit: Payer: Self-pay | Admitting: Internal Medicine

## 2018-11-24 ENCOUNTER — Other Ambulatory Visit (INDEPENDENT_AMBULATORY_CARE_PROVIDER_SITE_OTHER): Payer: Medicare Other

## 2018-11-24 ENCOUNTER — Other Ambulatory Visit: Payer: Self-pay

## 2018-11-24 ENCOUNTER — Encounter: Payer: Self-pay | Admitting: Internal Medicine

## 2018-11-24 ENCOUNTER — Ambulatory Visit (INDEPENDENT_AMBULATORY_CARE_PROVIDER_SITE_OTHER): Payer: Medicare Other | Admitting: Internal Medicine

## 2018-11-24 VITALS — BP 120/80 | HR 97 | Temp 98.2°F | Ht 63.5 in | Wt 184.0 lb

## 2018-11-24 DIAGNOSIS — E039 Hypothyroidism, unspecified: Secondary | ICD-10-CM

## 2018-11-24 DIAGNOSIS — I10 Essential (primary) hypertension: Secondary | ICD-10-CM

## 2018-11-24 DIAGNOSIS — R252 Cramp and spasm: Secondary | ICD-10-CM | POA: Insufficient documentation

## 2018-11-24 DIAGNOSIS — R8290 Unspecified abnormal findings in urine: Secondary | ICD-10-CM

## 2018-11-24 LAB — CBC
HCT: 36.9 % (ref 36.0–46.0)
Hemoglobin: 12.1 g/dL (ref 12.0–15.0)
MCHC: 32.9 g/dL (ref 30.0–36.0)
MCV: 85.6 fl (ref 78.0–100.0)
Platelets: 275 10*3/uL (ref 150.0–400.0)
RBC: 4.31 Mil/uL (ref 3.87–5.11)
RDW: 14.7 % (ref 11.5–15.5)
WBC: 7 10*3/uL (ref 4.0–10.5)

## 2018-11-24 LAB — MAGNESIUM: Magnesium: 1.8 mg/dL (ref 1.5–2.5)

## 2018-11-24 LAB — COMPREHENSIVE METABOLIC PANEL
ALT: 23 U/L (ref 0–35)
AST: 18 U/L (ref 0–37)
Albumin: 4.1 g/dL (ref 3.5–5.2)
Alkaline Phosphatase: 79 U/L (ref 39–117)
BUN: 10 mg/dL (ref 6–23)
CO2: 29 mEq/L (ref 19–32)
Calcium: 9.5 mg/dL (ref 8.4–10.5)
Chloride: 106 mEq/L (ref 96–112)
Creatinine, Ser: 0.91 mg/dL (ref 0.40–1.20)
GFR: 76.57 mL/min (ref >60.00)
Glucose, Bld: 100 mg/dL — ABNORMAL HIGH (ref 70–99)
Potassium: 2.9 mEq/L — ABNORMAL LOW (ref 3.5–5.1)
Sodium: 144 mEq/L (ref 135–145)
Total Bilirubin: 0.5 mg/dL (ref 0.2–1.2)
Total Protein: 7.2 g/dL (ref 6.0–8.3)

## 2018-11-24 LAB — POCT URINALYSIS DIPSTICK
Blood, UA: POSITIVE
Glucose, UA: NEGATIVE
Ketones, UA: 5
Leukocytes, UA: NEGATIVE
Nitrite, UA: POSITIVE
Protein, UA: POSITIVE — AB
Spec Grav, UA: 1.025 (ref 1.010–1.025)
Urobilinogen, UA: 0.2 E.U./dL
pH, UA: 6 (ref 5.0–8.0)

## 2018-11-24 LAB — LIPID PANEL
Cholesterol: 159 mg/dL (ref 0–200)
HDL: 32.6 mg/dL — ABNORMAL LOW (ref >39.00)
LDL Cholesterol: 96 mg/dL (ref 0–99)
NonHDL: 126.55
Total CHOL/HDL Ratio: 5
Triglycerides: 154 mg/dL — ABNORMAL HIGH (ref 0.0–149.0)
VLDL: 30.8 mg/dL (ref 0.0–40.0)

## 2018-11-24 LAB — T4, FREE: Free T4: 1.12 ng/dL (ref 0.60–1.60)

## 2018-11-24 LAB — VITAMIN D 25 HYDROXY (VIT D DEFICIENCY, FRACTURES): VITD: 16.42 ng/mL — ABNORMAL LOW (ref 30.00–100.00)

## 2018-11-24 LAB — HEMOGLOBIN A1C: Hgb A1c MFr Bld: 5.6 % (ref 4.6–6.5)

## 2018-11-24 LAB — TSH: TSH: 0.69 u[IU]/mL (ref 0.35–4.50)

## 2018-11-24 MED ORDER — NITROFURANTOIN MONOHYD MACRO 100 MG PO CAPS: 100.0000 mg | ORAL_CAPSULE | Freq: Two times a day (BID) | ORAL | 0 refills | Status: DC

## 2018-11-24 NOTE — Assessment & Plan Note (Signed)
Checking CMP for hypokalemia and magnesium level.

## 2018-11-24 NOTE — Patient Instructions (Signed)
You do have a urinary infection and we have sent in the macrobid to take 1 pill twice a day for 1 week.   We are checking the labs today and will call you with results.

## 2018-11-24 NOTE — Assessment & Plan Note (Signed)
POC U/A done in the office consistent with infection. Rx macrobid.

## 2018-11-24 NOTE — Progress Notes (Signed)
   Subjective:   Patient ID: Jacqueline Berry, female    DOB: 02-May-1960, 58 y.o.   MRN: [id number]  HPI The patient is a 58 YO female coming in for leg pain in the mornings (she has had low potassium levels before and this is not similar, denies morning stiffness or pain lasting longer than 5 minutes, she has pain in the bones of the shins both legs in the morning, denies change in activity in the last several months, going on for about 3 months, getting a little worse, having no swelling or rash on skin, overall it is worsening, has tried nothing except walking around which helps), and urine odor (states for the last 3 months smells like vienna sausage, denies pain on urination, denies urgency or pressure with voiding, denies fevers or chills) and thyroid (no labs in more than 1 year, taking synthroid 100 mcg daily, denies heat or cold intolerance, denies weight change significant unexplained).   Review of Systems  Constitutional: Negative.   HENT: Negative.   Eyes: Negative.   Respiratory: Negative for cough, chest tightness and shortness of breath.   Cardiovascular: Negative for chest pain, palpitations and leg swelling.  Gastrointestinal: Negative for abdominal distention, abdominal pain, constipation, diarrhea, nausea and vomiting.  Endocrine: Negative.   Genitourinary: Negative for decreased urine volume, difficulty urinating, dysuria, enuresis, flank pain, frequency, pelvic pain, urgency, vaginal discharge and vaginal pain.       Urine odor  Musculoskeletal: Positive for myalgias. Negative for arthralgias, back pain, gait problem, joint swelling, neck pain and neck stiffness.  Skin: Negative.   Neurological: Negative.   Psychiatric/Behavioral: Positive for sleep disturbance.    Objective:  Physical Exam Constitutional:      Appearance: She is well-developed.  HENT:     Head: Normocephalic and atraumatic.  Neck:     Musculoskeletal: Normal range of motion.  Cardiovascular:   Rate and Rhythm: Normal rate and regular rhythm.  Pulmonary:     Effort: Pulmonary effort is normal. No respiratory distress.     Breath sounds: Normal breath sounds. No wheezing or rales.  Abdominal:     General: Bowel sounds are normal. There is no distension.     Palpations: Abdomen is soft.     Tenderness: There is no abdominal tenderness. There is no rebound.  Musculoskeletal:        General: No swelling or tenderness.  Skin:    General: Skin is warm and dry.     Findings: No rash.  Neurological:     Mental Status: She is alert and oriented to person, place, and time.     Coordination: Coordination normal.     Vitals:   11/24/18 1509  BP: 120/80  Pulse: 97  Temp: 98.2 F (36.8 C)  TempSrc: Oral  SpO2: 98%  Weight: 184 lb (83.5 kg)  Height: 5' 3.5" (1.613 m)    Assessment & Plan:

## 2018-11-24 NOTE — Assessment & Plan Note (Signed)
Checking HgA1c, CMP, CBC, magnesium to check for etiology. She has had low potassium in the past which could cause current symptoms.

## 2018-11-24 NOTE — Assessment & Plan Note (Signed)
Checking TSH and free T4 and adjust as needed synthroid 100 mcg daily.

## 2018-11-27 ENCOUNTER — Other Ambulatory Visit: Payer: Self-pay | Admitting: Internal Medicine

## 2018-11-27 MED ORDER — POTASSIUM CHLORIDE CRYS ER 20 MEQ PO TBCR: 40.0000 meq | EXTENDED_RELEASE_TABLET | Freq: Every day | ORAL | 0 refills | Status: DC

## 2018-11-27 MED ORDER — VITAMIN D (ERGOCALCIFEROL) 1.25 MG (50000 UNIT) PO CAPS: 50000.0000 [IU] | ORAL_CAPSULE | ORAL | 0 refills | Status: DC

## 2018-12-13 ENCOUNTER — Telehealth: Payer: Self-pay | Admitting: Internal Medicine

## 2018-12-13 NOTE — Telephone Encounter (Signed)
Copied from [place] [id number]. Topic: General - Other >> Dec 13, 2018  4:27 PM Wynetta Emery, [place] C wrote: Reason for CRM: pt would like to know how many bp medications should she be taking? Pt is unsure because she has had some changes to medications.

## 2018-12-14 NOTE — Telephone Encounter (Signed)
Called patient back and she is willing to make changes to her BP medications. Also stated she did start the vitamin D and the potassium that was prescribed.

## 2018-12-14 NOTE — Telephone Encounter (Signed)
We had asked to change medication after last visit and have not heard back from patient regarding this.

## 2018-12-15 MED ORDER — LISINOPRIL 40 MG PO TABS: 40.0000 mg | ORAL_TABLET | Freq: Every day | ORAL | 3 refills | Status: DC

## 2018-12-15 NOTE — Telephone Encounter (Signed)
Have sent in higher dose lisinopril 40 mg daily to take instead of her lisinopril/hctz 20/12.5 that she takes now. This will not lower her potassium. Keep taking amlodipine 10 mg daily

## 2018-12-15 NOTE — Addendum Note (Signed)
Addended by: Pricilla Holm A on: 12/15/2018 11:51 AM   Modules accepted: Orders

## 2018-12-15 NOTE — Telephone Encounter (Signed)
Patient informed of MD response and stated understanding  

## 2018-12-19 ENCOUNTER — Other Ambulatory Visit: Payer: Self-pay | Admitting: Internal Medicine

## 2018-12-21 ENCOUNTER — Other Ambulatory Visit: Payer: Self-pay | Admitting: Internal Medicine

## 2019-01-16 ENCOUNTER — Encounter: Payer: Self-pay | Admitting: Gynecology

## 2019-02-04 ENCOUNTER — Other Ambulatory Visit: Payer: Self-pay | Admitting: Internal Medicine

## 2019-02-15 ENCOUNTER — Other Ambulatory Visit: Payer: Self-pay | Admitting: Internal Medicine

## 2019-02-16 ENCOUNTER — Other Ambulatory Visit: Payer: Self-pay | Admitting: Internal Medicine

## 2019-02-16 DIAGNOSIS — E039 Hypothyroidism, unspecified: Secondary | ICD-10-CM

## 2019-02-16 MED ORDER — AMLODIPINE BESYLATE 10 MG PO TABS: 10.0000 mg | ORAL_TABLET | Freq: Every day | ORAL | 1 refills | Status: DC

## 2019-02-16 NOTE — Telephone Encounter (Signed)
Requested medication (s) are due for refill today: no  Requested medication (s) are on the active medication list: yes  Last refill:  12/13/2018  Future visit scheduled: no  Notes to clinic:  Pharmacy states that patient is trying to get medication too early  Please advise   Requested Prescriptions  Pending Prescriptions Disp Refills   lisinopril (ZESTRIL) 40 MG tablet 90 tablet 3    Sig: Take 1 tablet (40 mg total) by mouth daily.     Cardiovascular:  ACE Inhibitors Failed - 02/16/2019 11:30 AM      Failed - K in normal range and within 180 days    Potassium  Date Value Ref Range Status  11/24/2018 2.9 (L) 3.5 - 5.1 mEq/L Final  10/26/2011 3.8 3.5 - 5.1 mmol/L Final         Passed - Cr in normal range and within 180 days    Creatinine  Date Value Ref Range Status  10/26/2011 0.85 0.60 - 1.30 mg/dL Final   Creatinine, Ser  Date Value Ref Range Status  11/24/2018 0.91 0.40 - 1.20 mg/dL Final         Passed - Patient is not pregnant      Passed - Last BP in normal range    BP Readings from Last 1 Encounters:  11/24/18 120/80         Passed - Valid encounter within last 6 months    Recent Outpatient Visits          2 months ago Abnormal urine odor   [place], [place], MD   7 months ago UTI symptoms   [place], [place], MD   11 months ago Post-traumatic headache, not intractable, unspecified chronicity pattern   [place], Elizabeth A, MD   12 months ago Essential hypertension   [place], Elizabeth A, MD   1 year ago Hypokalemia   [place], MD              levothyroxine (SYNTHROID) 100 MCG tablet 90 tablet 2    Sig: Take 1 tablet (100 mcg total) by mouth daily. Need annual appointment before next refill     Endocrinology:  Hypothyroid Agents Failed -  02/16/2019 11:30 AM      Failed - TSH needs to be rechecked within 3 months after an abnormal result. Refill until TSH is due.      Passed - TSH in normal range and within 360 days    TSH  Date Value Ref Range Status  11/24/2018 0.69 0.35 - 4.50 uIU/mL Final         Passed - Valid encounter within last 12 months    Recent Outpatient Visits          2 months ago Abnormal urine odor   [place], MD   7 months ago UTI symptoms   [place], MD   11 months ago Post-traumatic headache, not intractable, unspecified chronicity pattern   [place], Elizabeth A, MD   12 months ago Essential hypertension   [facility] -Chuck Hint, MD   1 year ago Hypokalemia   [facility] -Chuck Hint, MD             Signed  Prescriptions Disp Refills   amLODipine (NORVASC) 10 MG tablet 90 tablet 1    Sig: Take 1 tablet (10 mg total) by mouth daily.     Cardiovascular:  Calcium Channel Blockers Passed - 02/16/2019 11:30 AM      Passed - Last BP in normal range    BP Readings from Last 1 Encounters:  11/24/18 120/80         Passed - Valid encounter within last 6 months    Recent Outpatient Visits          2 months ago Abnormal urine odor   [place], Elizabeth A, MD   7 months ago UTI symptoms   [place], MD   11 months ago Post-traumatic headache, not intractable, unspecified chronicity pattern   [place], Elizabeth A, MD   12 months ago Essential hypertension   [facility] -Chuck Hint, MD   1 year ago Hypokalemia   [facility] -Chuck Hint, MD

## 2019-02-16 NOTE — Telephone Encounter (Signed)
Medication Refill - Medication: amLODipine (NORVASC) 10 MG tablet lisinopril (ZESTRIL) 40 MG tablet levothyroxine (SYNTHROID) 100 MCG tablet  Has the patient contacted their pharmacy? Yes - states pharmacy has told her to reach out to PCP (Agent: If no, request that the patient contact the pharmacy for the refill.) (Agent: If yes, when and what did the pharmacy advise?)  Preferred Pharmacy (with phone number or street name):  [id number] [place], [place] [phone] (Phone) [phone] (Fax)   Agent: Please be advised that RX refills may take up to 3 business days. We ask that you follow-up with your pharmacy.

## 2019-03-07 ENCOUNTER — Telehealth: Payer: Self-pay

## 2019-03-07 NOTE — Telephone Encounter (Signed)
Copied from [place] [id number]. Topic: General - Inquiry >> Feb 28, 2019  4:23 PM Mathis Bud wrote: Reason for CRM: Patient is requesting a call back from PCP nurse regarding her prescription for [facility]  FAX [phone] Call back [phone]

## 2019-03-07 NOTE — Telephone Encounter (Signed)
Called patient states she needs a Rx sent to [facility] for two bras and a prosthesis. Left breast is the one she is needing the prosthesis.

## 2019-03-08 NOTE — Telephone Encounter (Signed)
Is this the form we just signed? If not have [facility] contact [facility] and have them send forms to be signed as this is typically how these are done.

## 2019-03-09 NOTE — Telephone Encounter (Signed)
Faxed

## 2019-03-09 NOTE — Telephone Encounter (Signed)
Called [facility] they state for the first order it has to be written on an rx pad what is needed. States they are not allowed to initiate the order. I can fax it to them when done. (Mastectomy supplies) on the rx pad/ [phone]

## 2019-03-09 NOTE — Telephone Encounter (Signed)
Don erx 

## 2019-04-11 DIAGNOSIS — C50912 Malignant neoplasm of unspecified site of left female breast: Secondary | ICD-10-CM | POA: Diagnosis not present

## 2019-05-03 ENCOUNTER — Other Ambulatory Visit: Payer: Self-pay | Admitting: Internal Medicine

## 2019-05-03 DIAGNOSIS — Z1231 Encounter for screening mammogram for malignant neoplasm of breast: Secondary | ICD-10-CM

## 2019-05-24 ENCOUNTER — Encounter: Payer: Self-pay | Admitting: Obstetrics and Gynecology

## 2019-05-24 ENCOUNTER — Ambulatory Visit (INDEPENDENT_AMBULATORY_CARE_PROVIDER_SITE_OTHER): Payer: Medicare Other | Admitting: Obstetrics and Gynecology

## 2019-05-24 ENCOUNTER — Other Ambulatory Visit: Payer: Self-pay

## 2019-05-24 VITALS — BP 136/86 | Ht 63.0 in | Wt 204.0 lb

## 2019-05-24 DIAGNOSIS — Z01419 Encounter for gynecological examination (general) (routine) without abnormal findings: Secondary | ICD-10-CM

## 2019-05-24 DIAGNOSIS — Z13820 Encounter for screening for osteoporosis: Secondary | ICD-10-CM | POA: Diagnosis not present

## 2019-05-24 DIAGNOSIS — R8290 Unspecified abnormal findings in urine: Secondary | ICD-10-CM | POA: Diagnosis not present

## 2019-05-24 DIAGNOSIS — N3000 Acute cystitis without hematuria: Secondary | ICD-10-CM | POA: Diagnosis not present

## 2019-05-24 NOTE — Progress Notes (Signed)
HAIVYN BUCHWALD [id number]  SUBJECTIVE:  59 y.o. [id number] female for annual routine gynecologic exam. She has no gynecologic concerns, denies any vasomotor symptoms or PMB. Has noted a cloudy and foul urine odor no dysuria, no frequency. Tested last summer with UA and given antibiotics which temporarily clear up those symptoms but then they returned.  Last period in 2014.  Current Outpatient Medications  Medication Sig Dispense Refill  . amLODipine (NORVASC) 10 MG tablet Take 1 tablet (10 mg total) by mouth daily. 90 tablet 1  . benztropine (COGENTIN) 0.5 MG tablet Take 0.5 mg by mouth daily.     . haloperidol (HALDOL) 5 MG tablet Take 2.5 mg by mouth daily.     . haloperidol decanoate (HALDOL DECANOATE) 50 MG/ML injection Inject 100 mg into the muscle every 28 (twenty-eight) days.    Marland Kitchen levothyroxine (SYNTHROID) 100 MCG tablet Take 1 tablet (100 mcg total) by mouth daily. Need annual appointment before next refill 90 tablet 2  . lisinopril (ZESTRIL) 40 MG tablet Take 1 tablet (40 mg total) by mouth daily. 90 tablet 3  . loratadine (ALLERGY) 10 MG tablet Take 10 mg by mouth daily.     No current facility-administered medications for this visit.   Allergies: Latex  Patient's last menstrual period was 02/18/2011.  Past medical history,surgical history, problem list, medications, allergies, family history and social history were all reviewed and documented as reviewed in the EPIC chart.  ROS:  Feeling well. No dyspnea or chest pain on exertion.  No abdominal pain, change in bowel habits, black or bloody stools.  No urinary tract symptoms (other than noted above). GYN ROS: no abnormal bleeding, pelvic pain or discharge, no breast pain or new or enlarging lumps on self exam. No neurological complaints.   OBJECTIVE:  BP 136/86   Ht 5\' 3"  (1.6 m)   Wt 204 lb (92.5 kg)   LMP 02/18/2011   BMI 36.14 kg/m  The patient appears well, alert, oriented x 3, in no distress. ENT normal.   Neck supple. No cervical or supraclavicular adenopathy or thyromegaly.  Lungs are clear, good air entry, no wheezes, rhonchi or rales. S1 and S2 normal, no murmurs, regular rate and rhythm.  Abdomen soft without tenderness, guarding, mass or organomegaly.  Neurological is normal, no focal findings.  BREAST EXAM: Right breast appears normal, evidence of previous left mastectomy, no suspicious masses, no skin or nipple changes or axillary nodes bilaterally  PELVIC EXAM: VULVA: normal appearing vulva with no masses, tenderness or lesions, VAGINA: normal appearing vagina with normal color and discharge, no lesions, CERVIX: normal appearing cervix without discharge or lesions, UTERUS: uterus is normal size, shape, consistency and nontender, ADNEXA: nontender and no masses (exam limited by body habitus)  Chaperone: Caryn Bee present during the examination  Urinalysis indicates clear yellow urine, negative glucose, ketones, protein, nitrate, or leukocyte esterase, 0-5 WBC, 0-2 RBC, 6-10 squamous epithelial cells, moderate bacteria, no yeast, no casts.  ASSESSMENT:  59 y.o. [id number] here for annual gynecologic exam  PLAN:   1. Postmenopausal.  No gynecologic concerns or symptoms. 2. Urinary concerns.  UA is negative today, clarity is 'clear' on the analysis.  Recommended to increase fluid hydration and incorporate cranberry juice occasionally into her beverage selection. 2. Pap smear 03/2018 was normal. Not repeated today. Next Pap smear due 03/2021 following the current screening guidelines calling for the 3-year interval. 3. History of breast cancer s/p left mastectomy.  Mammogram 05/2018, next is scheduled on the  26th. Will continue with annual mammography. Breast exam normal today. 4. Colonoscopy at early age because of mistaken family history with her mother.  Did a Cologard last year and it was normal. Recommended that she continue colon cancer screening per the prescribed interval working with  her primary care physician.   5. DEXA never done before.  Discussed importance of vitamin D supplement especially as her level was low when tested last year.  Also recommended calcium supplement.  Recommend getting a DEXA scan for baseline bone mineral density counts.  She will plan to get this scheduled. 6. Health maintenance.  No lab work as she has this completed with her primary care provider.    Return annually or sooner, prn.  Joseph Pierini MD  05/24/19

## 2019-05-24 NOTE — Patient Instructions (Signed)
Please schedule your bone density scan when you have time Remember to take the vitamin D at least 1500-2000 IU per day Calcium supplement 1200 mg daily is also important for bone health Increase your water intake for better hydration to keep your urinary system flushed

## 2019-05-25 NOTE — Addendum Note (Signed)
Addended by: Nelva Nay on: 05/25/2019 11:15 AM   Modules accepted: Orders

## 2019-05-27 LAB — URINALYSIS, COMPLETE W/RFL CULTURE
Bilirubin Urine: NEGATIVE
Glucose, UA: NEGATIVE
Hyaline Cast: NONE SEEN /LPF
Ketones, ur: NEGATIVE
Leukocyte Esterase: NEGATIVE
Nitrites, Initial: NEGATIVE
Protein, ur: NEGATIVE
Specific Gravity, Urine: 1.02 (ref 1.001–1.03)
pH: 6.5 (ref 5.0–8.0)

## 2019-05-27 LAB — URINE CULTURE
MICRO NUMBER:: 10116541
SPECIMEN QUALITY:: ADEQUATE

## 2019-05-27 LAB — CULTURE INDICATED

## 2019-05-28 MED ORDER — SULFAMETHOXAZOLE-TRIMETHOPRIM 800-160 MG PO TABS: 1.0000 | ORAL_TABLET | Freq: Two times a day (BID) | ORAL | 0 refills | Status: AC

## 2019-05-28 NOTE — Addendum Note (Signed)
Addended by: Joseph Pierini D on: 05/28/2019 01:40 PM   Modules accepted: Orders

## 2019-05-28 NOTE — Progress Notes (Signed)
Please let [name] know that the urine culture indicates she does have a UTI, which is interesting because the UA did not seem to really suggest it.  May be contamination in the culture, but since she was having symptoms, I will give her Bactrim DS BID x 5 days to treat.

## 2019-05-30 ENCOUNTER — Other Ambulatory Visit: Payer: Self-pay

## 2019-06-06 ENCOUNTER — Other Ambulatory Visit: Payer: Self-pay | Admitting: Internal Medicine

## 2019-06-06 DIAGNOSIS — E039 Hypothyroidism, unspecified: Secondary | ICD-10-CM

## 2019-06-15 ENCOUNTER — Other Ambulatory Visit: Payer: Self-pay

## 2019-06-15 ENCOUNTER — Ambulatory Visit
Admission: RE | Admit: 2019-06-15 | Discharge: 2019-06-15 | Disposition: A | Discharge status: Home / Self Care | Payer: Medicare Other | Source: Ambulatory Visit | Attending: Internal Medicine | Admitting: Internal Medicine

## 2019-06-15 DIAGNOSIS — Z1231 Encounter for screening mammogram for malignant neoplasm of breast: Secondary | ICD-10-CM | POA: Diagnosis not present

## 2019-06-19 ENCOUNTER — Emergency Department (HOSPITAL_COMMUNITY)
Admission: EM | Admit: 2019-06-19 | Discharge: 2019-06-19 | Disposition: A | Discharge status: Home / Self Care | Payer: Medicare Other | Attending: Emergency Medicine | Admitting: Emergency Medicine

## 2019-06-19 ENCOUNTER — Encounter (HOSPITAL_COMMUNITY): Payer: Self-pay

## 2019-06-19 DIAGNOSIS — I10 Essential (primary) hypertension: Secondary | ICD-10-CM | POA: Insufficient documentation

## 2019-06-19 DIAGNOSIS — Z91040 Latex allergy status: Secondary | ICD-10-CM | POA: Diagnosis not present

## 2019-06-19 DIAGNOSIS — Z87891 Personal history of nicotine dependence: Secondary | ICD-10-CM | POA: Diagnosis not present

## 2019-06-19 DIAGNOSIS — M62830 Muscle spasm of back: Secondary | ICD-10-CM | POA: Diagnosis not present

## 2019-06-19 DIAGNOSIS — Z79899 Other long term (current) drug therapy: Secondary | ICD-10-CM | POA: Diagnosis not present

## 2019-06-19 DIAGNOSIS — E039 Hypothyroidism, unspecified: Secondary | ICD-10-CM | POA: Diagnosis not present

## 2019-06-19 DIAGNOSIS — M545 Low back pain: Secondary | ICD-10-CM | POA: Diagnosis present

## 2019-06-19 MED ORDER — LIDOCAINE 5 % EX PTCH: 1.0000 | MEDICATED_PATCH | CUTANEOUS | 0 refills | Status: DC

## 2019-06-19 MED ORDER — METHOCARBAMOL 500 MG PO TABS: 500.0000 mg | ORAL_TABLET | Freq: Two times a day (BID) | ORAL | 0 refills | Status: DC

## 2019-06-19 NOTE — ED Notes (Signed)
Patient given discharge instructions patient verbalizes understanding. 

## 2019-06-19 NOTE — ED Provider Notes (Signed)
[facility] EMERGENCY DEPARTMENT Provider Note   CSN: [id number] Arrival date & time: 06/19/19  1627     History Chief Complaint  Patient presents with  . Back Spasm    Jacqueline Berry is a 59 y.o. female.  HPI      Jacqueline Berry is a 59 y.o. female, with a history of bipolar, HTN, lower back pain, presenting to the ED with spasms in her lower back muscles beginning this morning.  Patient tried stretches and applying heat.  Eventually, the spasms resolved.  She has had this issue before.  She is here requesting advice and medication to try to prevent them from recurring or to treat them if they do recur.  Denies fever/chills, urinary symptoms, numbness, weakness, abdominal pain, falls/trauma, changes in bowel or bladder function, saddle anesthesias, or any other complaints.     Past Medical History:  Diagnosis Date  . Bipolar disorder ([place])   . Breast cancer ([place])    with chemo - 1997  . Hypertension   . Syphilis   . Thyroid disease   . Thyroid disease     Patient Active Problem List   Diagnosis Date Noted  . Abnormal urine odor 11/24/2018  . Leg cramps 11/24/2018  . Head pain 02/28/2018  . Hypokalemia 11/16/2017  . Urinary incontinence 09/30/2017  . Routine general medical examination at a health care facility 09/30/2017  . Paresthesia of both feet 10/17/2014  . Obesity 10/16/2009  . Hypothyroidism 07/11/2009  . Paranoid schizophrenia in remission ([place]) 06/24/2009  . TOBACCO ABUSE 06/24/2009  . Essential hypertension 06/24/2009  . HX, PERSONAL, MALIGNANCY, BREAST 12/07/2006    Past Surgical History:  Procedure Laterality Date  . BREAST BIOPSY  1997  . MASTECTOMY     left breat mastectomy 1997  . TUBAL LIGATION     1992     OB History    Gravida  5   Para  3   Term  3   Preterm      AB  2   Living  3     SAB      TAB  2   Ectopic      Multiple      Live Births              Family History  Problem Relation  Age of Onset  . Stroke Father   . Diabetes Brother   . Coronary artery disease Brother   . Cancer Mother        ? type  . Breast cancer Sister        24's    Social History   Tobacco Use  . Smoking status: Former Smoker    Packs/day: 0.50    Years: 37.00    Pack years: 18.50    Types: Cigarettes  . Smokeless tobacco: Never Used  Substance Use Topics  . Alcohol use: No    Comment: Pt denies  . Drug use: No    Comment: Pt denies     Home Medications Prior to Admission medications   Medication Sig Start Date End Date Taking? Authorizing Provider  amLODipine (NORVASC) 10 MG tablet Take 1 tablet (10 mg total) by mouth daily. 02/16/19   Hoyt Koch, MD  benztropine (COGENTIN) 0.5 MG tablet Take 0.5 mg by mouth daily.     [provider]  haloperidol (HALDOL) 5 MG tablet Take 2.5 mg by mouth daily.     [provider]  haloperidol decanoate (HALDOL DECANOATE) 50 MG/ML injection Inject 100 mg into the muscle every 28 (twenty-eight) days.    [provider]  levothyroxine (SYNTHROID) 100 MCG tablet TAKE 1 TABLET (100 MCG TOTAL) BY MOUTH DAILY. NEED ANNUAL APPOINTMENT BEFORE NEXT REFILL 06/06/19   Hoyt Koch, MD  lidocaine (LIDODERM) 5 % Place 1 patch onto the skin daily. Remove & Discard patch within 12 hours or as directed by MD 06/19/19   Mallary Kreger C, PA-C  lisinopril (ZESTRIL) 40 MG tablet Take 1 tablet (40 mg total) by mouth daily. 12/15/18   Hoyt Koch, MD  loratadine (ALLERGY) 10 MG tablet Take 10 mg by mouth daily.    [provider]  methocarbamol (ROBAXIN) 500 MG tablet Take 1 tablet (500 mg total) by mouth 2 (two) times daily. 06/19/19   Chaske Paskett C, PA-C    Allergies    Latex  Review of Systems   Review of Systems  Constitutional: Negative for fever.  Respiratory: Negative for shortness of breath.   Cardiovascular: Negative for chest pain.  Gastrointestinal: Negative for nausea and vomiting.    Genitourinary: Negative for dysuria, flank pain, frequency and hematuria.  Musculoskeletal: Positive for back pain.  Neurological: Negative for weakness and numbness.    Physical Exam Updated Vital Signs BP 126/85   Pulse 97   Temp 98.9 F (37.2 C) (Oral)   Resp 16   Ht 5\' 3"  (1.6 m)   Wt 93 kg   LMP 02/18/2011   SpO2 100%   BMI 36.32 kg/m   Physical Exam Vitals and nursing note reviewed.  Constitutional:      General: She is not in acute distress.    Appearance: She is well-developed. She is not diaphoretic.  HENT:     Head: Normocephalic and atraumatic.     Mouth/Throat:     Mouth: Mucous membranes are moist.     Pharynx: Oropharynx is clear.  Eyes:     Conjunctiva/sclera: Conjunctivae normal.  Cardiovascular:     Rate and Rhythm: Normal rate and regular rhythm.     Pulses: Normal pulses.          Radial pulses are 2+ on the right side and 2+ on the left side.       Posterior tibial pulses are 2+ on the right side and 2+ on the left side.     Comments: Tactile temperature in the extremities appropriate and equal bilaterally. Pulmonary:     Effort: Pulmonary effort is normal. No respiratory distress.  Abdominal:     Palpations: Abdomen is soft.     Tenderness: There is no abdominal tenderness. There is no guarding.  Musculoskeletal:     Cervical back: Neck supple.     Right lower leg: No edema.     Left lower leg: No edema.     Comments: No current tenderness in the muscles of the lower back, however, patient indicates her pain was in the bilateral lumbar musculature. Normal motor function intact in all extremities. No midline spinal tenderness.   Skin:    General: Skin is warm and dry.  Neurological:     Mental Status: She is alert.     Comments: Sensation grossly intact to light touch in the lower extremities bilaterally. No saddle anesthesias. Strength 5/5 in the bilateral lower extremities. No noted gait deficit. Coordination intact.  Psychiatric:         Mood and Affect: Mood and affect normal.  Speech: Speech normal.        Behavior: Behavior normal.     ED Results / Procedures / Treatments   Labs (all labs ordered are listed, but only abnormal results are displayed) Labs Reviewed - No data to display  EKG None  Radiology No results found.  Procedures Procedures (including critical care time)  Medications Ordered in ED Medications - No data to display  ED Course  I have reviewed the triage vital signs and the nursing notes.  Pertinent labs & imaging results that were available during my care of the patient were reviewed by me and considered in my medical decision making (see chart for details).    MDM Rules/Calculators/A&P                      Patient presents with previous episodes of lower back muscle spasms.  Symptoms resolved prior to arrival. The patient was given instructions for home care as well as return precautions. Patient voices understanding of these instructions, accepts the plan, and is comfortable with discharge.    Final Clinical Impression(s) / ED Diagnoses Final diagnoses:  Spasm of back muscles    Rx / DC Orders ED Discharge Orders         Ordered    methocarbamol (ROBAXIN) 500 MG tablet  2 times daily     06/19/19 1912    lidocaine (LIDODERM) 5 %  Every 24 hours     06/19/19 1912           Lorayne Bender, PA-C 06/19/19 [place], [place], DO 06/19/19 2226

## 2019-06-19 NOTE — Discharge Instructions (Signed)
  Take it easy, but do not lay around too much as this may make any stiffness worse.  If the spasms recur, try the following: Antiinflammatory medications: Take 600 mg of ibuprofen every 6 hours or 440 mg (over the counter dose) to 500 mg (prescription dose) of naproxen every 12 hours for the next 3 days. After this time, these medications may be used as needed for pain. Take these medications with food to avoid upset stomach. Choose only one of these medications, do not take them together. Acetaminophen (generic for Tylenol): Should you continue to have additional pain while taking the ibuprofen or naproxen, you may add in acetaminophen as needed. Your daily total maximum amount of acetaminophen from all sources should be limited to 4000mg /day for persons without liver problems, or 2000mg /day for those with liver problems. Methocarbamol: Methocarbamol (generic for Robaxin) is a muscle relaxer and can help relieve stiff muscles or muscle spasms.  Do not drive or perform other dangerous activities while taking this medication as it can cause drowsiness as well as changes in reaction time and judgement. Lidocaine patches: These are available via either prescription or over-the-counter. The over-the-counter option may be more economical one and are likely just as effective. There are multiple over-the-counter brands, such as Salonpas. Ice: May apply ice to the area over the next 24 hours for 15 minutes at a time to reduce pain, inflammation, and swelling, if present. Exercises: Be sure to perform the attached exercises starting with three times a week and working up to performing them daily. This is an essential part of preventing long term problems.  Follow up: Follow up with a primary care provider for any future management of these complaints. Be sure to follow up within 7-10 days. Return: Return to the ED should symptoms worsen.  For prescription assistance, may try using prescription discount sites or  apps, such as goodrx.com

## 2019-06-19 NOTE — ED Triage Notes (Signed)
Pt arrives POV for eval of back and leg spasm. Reports she awoke w/ both this AM, took motrin and legs have gotten better. Reports ongoing back spasm. Endorses hx of same

## 2019-06-22 ENCOUNTER — Telehealth: Payer: Self-pay | Admitting: Internal Medicine

## 2019-06-22 NOTE — Progress Notes (Signed)
  Chronic Care Management   Outreach Note  [id number] Name: [name] MRN: [id number] DOB: [birth year]  Referred by: Hoyt Koch, MD Reason for referral : No chief complaint on file.   An unsuccessful telephone outreach was attempted today. The patient was referred to the pharmacist for assistance with care management and care coordination.   Follow Up Plan:   Raynicia Dukes [employer] Scheduler

## 2019-07-05 ENCOUNTER — Telehealth: Payer: Self-pay | Admitting: Internal Medicine

## 2019-07-05 DIAGNOSIS — I10 Essential (primary) hypertension: Secondary | ICD-10-CM

## 2019-07-05 NOTE — Chronic Care Management (AMB) (Signed)
  Chronic Care Management   Note  [id number] Name: RYLAH HEMPLE MRN: [id number] DOB: [id number]  HYATT BRODRICK is a 59 y.o. year old female who is a primary care patient of Sharlet Salina Real Cons, MD. I reached out to [name] by phone today in response to a referral sent by Ms. Mirna Mires Picariello's PCP, Hoyt Koch, MD.   Ms. Starner was given information about Chronic Care Management services today including:  1. CCM service includes personalized support from designated clinical staff supervised by her physician, including individualized plan of care and coordination with other care providers 2. 24/7 contact phone numbers for assistance for urgent and routine care needs. 3. Service will only be billed when office clinical staff spend 20 minutes or more in a month to coordinate care. 4. Only one practitioner may furnish and bill the service in a calendar month. 5. The patient may stop CCM services at any time (effective at the end of the month) by phone call to the office staff.   Patient agreed to services and verbal consent obtained.   Follow up plan:   Raynicia Dukes [employer] Scheduler

## 2019-07-09 ENCOUNTER — Other Ambulatory Visit: Payer: Self-pay

## 2019-07-10 ENCOUNTER — Encounter: Payer: Self-pay | Admitting: Women's Health

## 2019-07-10 ENCOUNTER — Ambulatory Visit: Payer: Medicare Other | Admitting: Women's Health

## 2019-07-10 VITALS — BP 132/80

## 2019-07-10 DIAGNOSIS — R8290 Unspecified abnormal findings in urine: Secondary | ICD-10-CM | POA: Diagnosis not present

## 2019-07-10 DIAGNOSIS — N898 Other specified noninflammatory disorders of vagina: Secondary | ICD-10-CM

## 2019-07-10 LAB — WET PREP FOR TRICH, YEAST, CLUE

## 2019-07-10 MED ORDER — CIPROFLOXACIN HCL 250 MG PO TABS: 250.0000 mg | ORAL_TABLET | Freq: Two times a day (BID) | ORAL | 0 refills | Status: DC

## 2019-07-10 NOTE — Patient Instructions (Signed)
Urinary Tract Infection, Adult A urinary tract infection (UTI) is an infection of any part of the urinary tract. The urinary tract includes:  The kidneys.  The ureters.  The bladder.  The urethra. These organs make, store, and get rid of pee (urine) in the body. What are the causes? This is caused by germs (bacteria) in your genital area. These germs grow and cause swelling (inflammation) of your urinary tract. What increases the risk? You are more likely to develop this condition if:  You have a small, thin tube (catheter) to drain pee.  You cannot control when you pee or poop (incontinence).  You are female, and: ? You use these methods to prevent pregnancy:  A medicine that kills sperm (spermicide).  A device that blocks sperm (diaphragm). ? You have low levels of a female hormone (estrogen). ? You are pregnant.  You have genes that add to your risk.  You are sexually active.  You take antibiotic medicines.  You have trouble peeing because of: ? A prostate that is bigger than normal, if you are female. ? A blockage in the part of your body that drains pee from the bladder (urethra). ? A kidney stone. ? A nerve condition that affects your bladder (neurogenic bladder). ? Not getting enough to drink. ? Not peeing often enough.  You have other conditions, such as: ? Diabetes. ? A weak disease-fighting system (immune system). ? Sickle cell disease. ? Gout. ? Injury of the spine. What are the signs or symptoms? Symptoms of this condition include:  Needing to pee right away (urgently).  Peeing often.  Peeing small amounts often.  Pain or burning when peeing.  Blood in the pee.  Pee that smells bad or not like normal.  Trouble peeing.  Pee that is cloudy.  Fluid coming from the vagina, if you are female.  Pain in the belly or lower back. Other symptoms include:  Throwing up (vomiting).  No urge to eat.  Feeling mixed up (confused).  Being tired  and grouchy (irritable).  A fever.  Watery poop (diarrhea). How is this treated? This condition may be treated with:  Antibiotic medicine.  Other medicines.  Drinking enough water. Follow these instructions at home:  Medicines  Take over-the-counter and prescription medicines only as told by your doctor.  If you were prescribed an antibiotic medicine, take it as told by your doctor. Do not stop taking it even if you start to feel better. General instructions  Make sure you: ? Pee until your bladder is empty. ? Do not hold pee for a long time. ? Empty your bladder after sex. ? Wipe from front to back after pooping if you are a female. Use each tissue one time when you wipe.  Drink enough fluid to keep your pee pale yellow.  Keep all follow-up visits as told by your doctor. This is important. Contact a doctor if:  You do not get better after 1-2 days.  Your symptoms go away and then come back. Get help right away if:  You have very bad back pain.  You have very bad pain in your lower belly.  You have a fever.  You are sick to your stomach (nauseous).  You are throwing up. Summary  A urinary tract infection (UTI) is an infection of any part of the urinary tract.  This condition is caused by germs in your genital area.  There are many risk factors for a UTI. These include having a small, thin  tube to drain pee and not being able to control when you pee or poop.  Treatment includes antibiotic medicines for germs.  Drink enough fluid to keep your pee pale yellow. This information is not intended to replace advice given to you by your health care provider. Make sure you discuss any questions you have with your health care provider. Document Revised: 03/23/2018 Document Reviewed: 10/13/2017 Elsevier Patient Education  [place] DASH stands for "Dietary Approaches to Stop Hypertension." The DASH eating plan is a healthy eating plan that  has been shown to reduce high blood pressure (hypertension). It may also reduce your risk for type 2 diabetes, heart disease, and stroke. The DASH eating plan may also help with weight loss. What are tips for following this plan?  General guidelines  Avoid eating more than 2,300 mg (milligrams) of salt (sodium) a day. If you have hypertension, you may need to reduce your sodium intake to 1,500 mg a day.  Limit alcohol intake to no more than 1 drink a day for nonpregnant women and 2 drinks a day for men. One drink equals 12 oz of beer, 5 oz of wine, or 1 oz of hard liquor.  Work with your health care provider to maintain a healthy body weight or to lose weight. Ask what an ideal weight is for you.  Get at least 30 minutes of exercise that causes your heart to beat faster (aerobic exercise) most days of the week. Activities may include walking, swimming, or biking.  Work with your health care provider or diet and nutrition specialist (dietitian) to adjust your eating plan to your individual calorie needs. Reading food labels   Check food labels for the amount of sodium per serving. Choose foods with less than 5 percent of the Daily Value of sodium. Generally, foods with less than 300 mg of sodium per serving fit into this eating plan.  To find whole grains, look for the word "whole" as the first word in the ingredient list. Shopping  Buy products labeled as "low-sodium" or "no salt added."  Buy fresh foods. Avoid canned foods and premade or frozen meals. Cooking  Avoid adding salt when cooking. Use salt-free seasonings or herbs instead of table salt or sea salt. Check with your health care provider or pharmacist before using salt substitutes.  Do not fry foods. Cook foods using healthy methods such as baking, boiling, grilling, and broiling instead.  Cook with heart-healthy oils, such as olive, canola, soybean, or sunflower oil. Meal planning  Eat a balanced diet that includes: ? 5  or more servings of fruits and vegetables each day. At each meal, try to fill half of your plate with fruits and vegetables. ? Up to 6-8 servings of whole grains each day. ? Less than 6 oz of lean meat, poultry, or fish each day. A 3-oz serving of meat is about the same size as a deck of cards. One egg equals 1 oz. ? 2 servings of low-fat dairy each day. ? A serving of nuts, seeds, or beans 5 times each week. ? Heart-healthy fats. Healthy fats called Omega-3 fatty acids are found in foods such as flaxseeds and coldwater fish, like sardines, salmon, and mackerel.  Limit how much you eat of the following: ? Canned or prepackaged foods. ? Food that is high in trans fat, such as fried foods. ? Food that is high in saturated fat, such as fatty meat. ? Sweets, desserts, sugary drinks, and other foods  with added sugar. ? Full-fat dairy products.  Do not salt foods before eating.  Try to eat at least 2 vegetarian meals each week.  Eat more home-cooked food and less restaurant, buffet, and fast food.  When eating at a restaurant, ask that your food be prepared with less salt or no salt, if possible. What foods are recommended? The items listed may not be a complete list. Talk with your dietitian about what dietary choices are best for you. Grains Whole-grain or whole-wheat bread. Whole-grain or whole-wheat pasta. Brown rice. [place]. Bulgur. Whole-grain and low-sodium cereals. Pita bread. Low-fat, low-sodium crackers. Whole-wheat flour tortillas. Vegetables Fresh or frozen vegetables (raw, steamed, roasted, or grilled). Low-sodium or reduced-sodium tomato and vegetable juice. Low-sodium or reduced-sodium tomato sauce and tomato paste. Low-sodium or reduced-sodium canned vegetables. Fruits All fresh, dried, or frozen fruit. Canned fruit in natural juice (without added sugar). Meat and other protein foods Skinless chicken or [place]. Ground chicken or [place]. Pork with fat trimmed off. Fish  and seafood. Egg whites. Dried beans, peas, or lentils. Unsalted nuts, nut butters, and seeds. Unsalted canned beans. Lean cuts of beef with fat trimmed off. Low-sodium, lean deli meat. Dairy Low-fat (1%) or fat-free (skim) milk. Fat-free, low-fat, or reduced-fat cheeses. Nonfat, low-sodium ricotta or cottage cheese. Low-fat or nonfat yogurt. Low-fat, low-sodium cheese. Fats and oils Soft margarine without trans fats. Vegetable oil. Low-fat, reduced-fat, or light mayonnaise and salad dressings (reduced-sodium). Canola, safflower, olive, soybean, and sunflower oils. Avocado. Seasoning and other foods Herbs. Spices. Seasoning mixes without salt. Unsalted popcorn and pretzels. Fat-free sweets. What foods are not recommended? The items listed may not be a complete list. Talk with your dietitian about what dietary choices are best for you. Grains Baked goods made with fat, such as croissants, muffins, or some breads. Dry pasta or rice meal packs. Vegetables Creamed or fried vegetables. Vegetables in a cheese sauce. Regular canned vegetables (not low-sodium or reduced-sodium). Regular canned tomato sauce and paste (not low-sodium or reduced-sodium). Regular tomato and vegetable juice (not low-sodium or reduced-sodium). Angie Fava. Olives. Fruits Canned fruit in a light or heavy syrup. Fried fruit. Fruit in cream or butter sauce. Meat and other protein foods Fatty cuts of meat. Ribs. Fried meat. Berniece Salines. Sausage. Bologna and other processed lunch meats. Salami. Fatback. Hotdogs. Bratwurst. Salted nuts and seeds. Canned beans with added salt. Canned or smoked fish. Whole eggs or egg yolks. Chicken or [place] with skin. Dairy Whole or 2% milk, cream, and half-and-half. Whole or full-fat cream cheese. Whole-fat or sweetened yogurt. Full-fat cheese. Nondairy creamers. Whipped toppings. Processed cheese and cheese spreads. Fats and oils Butter. Stick margarine. Lard. Shortening. Ghee. Bacon fat. Tropical oils,  such as coconut, palm kernel, or palm oil. Seasoning and other foods Salted popcorn and pretzels. Onion salt, garlic salt, seasoned salt, table salt, and sea salt. Worcestershire sauce. Tartar sauce. Barbecue sauce. Teriyaki sauce. Soy sauce, including reduced-sodium. Steak sauce. Canned and packaged gravies. Fish sauce. Oyster sauce. Cocktail sauce. Horseradish that you find on the shelf. Ketchup. Mustard. Meat flavorings and tenderizers. Bouillon cubes. Hot sauce and Tabasco sauce. Premade or packaged marinades. Premade or packaged taco seasonings. Relishes. Regular salad dressings. Where to find more information:  National Heart, Lung, and [place]: https://wilson-eaton.com/  American Heart Association: www.heart.org Summary  The DASH eating plan is a healthy eating plan that has been shown to reduce high blood pressure (hypertension). It may also reduce your risk for type 2 diabetes, heart disease, and stroke.  With the  DASH eating plan, you should limit salt (sodium) intake to 2,300 mg a day. If you have hypertension, you may need to reduce your sodium intake to 1,500 mg a day.  When on the DASH eating plan, aim to eat more fresh fruits and vegetables, whole grains, lean proteins, low-fat dairy, and heart-healthy fats.  Work with your health care provider or diet and nutrition specialist (dietitian) to adjust your eating plan to your individual calorie needs. This information is not intended to replace advice given to you by your health care provider. Make sure you discuss any questions you have with your health care provider. Document Revised: 03/18/2017 Document Reviewed: 03/29/2016 Elsevier Patient Education  2020 [employer].

## 2019-07-10 NOTE — Progress Notes (Signed)
59 year old MBF G5, P3 presents with complaint of foul-smelling urine for the past few weeks.  Denies pain at end of stream of urination or burning.  Was treated for UTI with Bactrim for 5 days 05/24/2019 urine culture +100,000 E. coli and sensitive to Bactrim.  States the odor resolved after antibiotic use but has returned.    Postmenopausal no HRT/same partner.  Denies vaginal discharge with odor or itching, abdominal pain or fever.  Medical problems include hypertension and hypothyroidism with primary care manages.  History of mental health problems.  Has quit smoking.  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  Denies change in routine or exercise.  Exam: Appears well.  No CVAT.  Abdomen soft, nontender, external genitalia within normal limits, speculum exam scant white discharge, wet prep negative.  Bimanual no CMT or adnexal tenderness. UA: Positive nitrites, negative trace leukocytes, 10-20 WBCs, no RBCs, 10-20 squamous epithelials, many bacteria/ positive for odor  Probable UTI/urine odor  Plan: Cipro 250 mg p.o. twice daily for 5 days, reviewed importance of increasing water, avoiding soda.  UTI prevention discussed.  Urine culture pending.  Instructed to call if urine odor persists.

## 2019-07-12 LAB — URINE CULTURE
MICRO NUMBER:: 10282224
SPECIMEN QUALITY:: ADEQUATE

## 2019-07-12 LAB — URINALYSIS, COMPLETE W/RFL CULTURE
Bilirubin Urine: NEGATIVE
Glucose, UA: NEGATIVE
Hgb urine dipstick: NEGATIVE
Hyaline Cast: NONE SEEN /LPF
Ketones, ur: NEGATIVE
Nitrites, Initial: POSITIVE — AB
Protein, ur: NEGATIVE
RBC / HPF: NONE SEEN /HPF (ref 0–2)
Specific Gravity, Urine: 1.02 (ref 1.001–1.03)
pH: 5.5 (ref 5.0–8.0)

## 2019-07-12 LAB — CULTURE INDICATED

## 2019-07-20 ENCOUNTER — Ambulatory Visit: Payer: Medicare Other | Attending: Internal Medicine

## 2019-07-20 DIAGNOSIS — Z23 Encounter for immunization: Secondary | ICD-10-CM

## 2019-07-20 NOTE — Progress Notes (Signed)
   Covid-19 Vaccination Clinic  Name:  YANERIS CAPPO    MRN: [id number] DOB: [birth year]  07/20/2019  Ms. Ramella was observed post Covid-19 immunization for 15 minutes without incident. She was provided with Vaccine Information Sheet and instruction to access the V-Safe system.   Ms. Montney was instructed to call 911 with any severe reactions post vaccine: Marland Kitchen Difficulty breathing  . Swelling of face and throat  . A fast heartbeat  . A bad rash all over body  . Dizziness and weakness   Immunizations Administered    Name Date Dose VIS Date Route   Pfizer COVID-19 Vaccine 07/20/2019  1:46 PM 0.3 mL 03/30/2019 Intramuscular   Manufacturer: [employer], [employer]   Lot: [id number]   [place]: [id number]

## 2019-07-25 ENCOUNTER — Ambulatory Visit: Payer: Medicare Other | Admitting: Pharmacist

## 2019-07-25 ENCOUNTER — Other Ambulatory Visit: Payer: Self-pay

## 2019-07-25 DIAGNOSIS — F200 Paranoid schizophrenia: Secondary | ICD-10-CM

## 2019-07-25 DIAGNOSIS — R252 Cramp and spasm: Secondary | ICD-10-CM

## 2019-07-25 DIAGNOSIS — I10 Essential (primary) hypertension: Secondary | ICD-10-CM

## 2019-07-25 DIAGNOSIS — E039 Hypothyroidism, unspecified: Secondary | ICD-10-CM

## 2019-07-25 NOTE — Chronic Care Management (AMB) (Signed)
Chronic Care Management Pharmacy  Name: Jacqueline Berry  MRN: [id number] DOB: 1961/02/27  Chief Complaint/ HPI  Jacqueline Berry,  59 y.o. , female presents for their Initial CCM visit with the clinical pharmacist via telephone due to COVID-19 Pandemic.  PCP : Jacqueline Koch, MD  [place]: lived with mother and husband. Stiffness in legs. Their chronic conditions include: HTN, hypothyroidism, tobacco use, schizophrenia, pain  Office Visits: 11/24/18 Dr Jacqueline Berry OV: leg pain, urine odor. Rx'd Macrobid for UTI.  12/13/18 - changed lisinopril/hctz to lisinopril 40 mg to avoid low potassium.   Consult Visit: 07/10/19 NP Jacqueline Berry/Dr Jacqueline Berry (OB/GYN): UTI tx'd with cipro x 5 days.  06/19/19 ED visit: back spasms, rx'd lidocaine patch and methocarbamol.   05/24/19 Dr Jacqueline Berry (OB/GYN): GYN exam normal. Rec'd DEXA scan, Vitamin D/calcium supplementation. UTI, tx'd with Bactrim  Medications: Outpatient Encounter Medications as of 07/25/2019  Medication Sig  . amLODipine (NORVASC) 10 MG tablet Take 1 tablet (10 mg total) by mouth daily.  . benztropine (COGENTIN) 0.5 MG tablet Take 0.5 mg by mouth daily.   . haloperidol (HALDOL) 5 MG tablet Take 2.5 mg by mouth daily.   . haloperidol decanoate (HALDOL DECANOATE) 50 MG/ML injection Inject 100 mg into the muscle every 28 (twenty-eight) days.  Marland Kitchen levothyroxine (SYNTHROID) 100 MCG tablet TAKE 1 TABLET (100 MCG TOTAL) BY MOUTH DAILY. NEED ANNUAL APPOINTMENT BEFORE NEXT REFILL  . lisinopril (ZESTRIL) 40 MG tablet Take 1 tablet (40 mg total) by mouth daily.  Marland Kitchen loratadine (ALLERGY) 10 MG tablet Take 10 mg by mouth daily.  . Multiple Vitamins-Minerals (MULTIVITAMIN WITH MINERALS) tablet Take 1 tablet by mouth daily.  . Calcium Carb-Cholecalciferol (CALCIUM-VITAMIN D3) 600-400 MG-UNIT TABS Take 1 tablet by mouth daily.  . ciprofloxacin (CIPRO) 250 MG tablet Take 1 tablet (250 mg total) by mouth 2 (two) times daily. (Patient not taking: Reported on 07/25/2019)  .  methocarbamol (ROBAXIN) 500 MG tablet Take 1 tablet (500 mg total) by mouth 2 (two) times daily. (Patient not taking: Reported on 07/25/2019)   No facility-administered encounter medications on file as of 07/25/2019.     Current Diagnosis/Assessment:  Goals Addressed            This Visit's Progress   . Pharmacy Care Plan       CARE PLAN ENTRY  Current Barriers:  . Chronic Disease Management support, education, and care coordination needs related to HTN and HLD, hypothyroidism, schizophrenia  Pharmacist Clinical Goal(s):  Marland Kitchen Over the next 180 days, patient will demonstrate Improved medication adherence as evidenced by pill packaging . Maintain BP < 140/90 . Maintain LDL < 100 . Maintain TSH in target range . Ensure safety, efficacy, and affordability of medications  Interventions: . Comprehensive medication review performed. . Counseled on lifestyle management of blood pressure and cholesterol to prevent heart disease . Utilize [facility] for medication synchronization, packaging and delivery  Patient Self Care Activities:  . Self administers medications as prescribed, Calls pharmacy for medication refills, and Calls provider office for new concerns or questions  Initial goal documentation       Hypertension   Office blood pressures are  BP Readings from Last 3 Encounters:  07/10/19 132/80  06/19/19 (!) 128/92  05/24/19 136/86    Patient has failed these meds in the past: n/a Patient is currently controlled on the following medications: amlodipine 10 mg daily, lisinopril 40 mg daily  Patient checks BP at home infrequently  Patient home BP readings are ranging: n/a  We discussed diet and exercise extensively, BP goals, primary prevention of ASCVD.  Plan  Continue current medications and control with diet and exercise     Hyperlipidemia   Lipid Panel     Component Value Date/Time   CHOL 159 11/24/2018 1537   TRIG 154.0 (H) 11/24/2018 1537   HDL  32.60 (L) 11/24/2018 1537   CHOLHDL 5 11/24/2018 1537   VLDL 30.8 11/24/2018 1537   LDLCALC 96 11/24/2018 1537     The 10-year ASCVD risk score Mikey Bussing DC Jr., et al., 2013) is: 8%   Values used to calculate the score:     Age: 66 years     Sex: Female     Is Non-Hispanic African American: Yes     Diabetic: No     Tobacco smoker: No     Systolic Blood Pressure: [id number] mmHg     Is BP treated: Yes     HDL Cholesterol: 32.6 mg/dL     Total Cholesterol: 159 mg/dL   Patient has failed these meds in past: n/a Patient is currently controlled on the following medications: no meds  We discussed:  diet and exercise extensively, role of cholesterol in ASCVD, cholesterol goals.  Plan  Continue control with diet and exercise   Hypothyroidism   TSH  Date Value Ref Range Status  11/24/2018 0.69 0.35 - 4.50 uIU/mL Final    Patient has failed these meds in past: n/a Patient is currently controlled on the following medications: levothyroxine 100 mcg daily  We discussed:  Symptoms of hypo- and hyperthyroidism. Pt reports she is tired a lot of the time, but she does have an old mattress and does not sleep that well, unlikely related to thryoid given borderline low TSH.  Plan  Continue current medications   Schizophrenia   Patient has failed these meds in past: n/a Patient is currently controlled on the following medications: haloperidol decanoate 100 mg IM q 28 days, haloperidol 2.5 mg (1/2 of 5 mg) daily, benztropine 0.5 mg daily,   We discussed:  Pt reports she is doing well on current doses, she [facility]. She reports Haldol IM is a little expensive but manageable.   Plan  Continue current medications    Pain/Spasms   Pt describes Pain/stiffness "down to bones" x 5 months, worse in the mornings.  Patient has failed these meds in past: cyclobenzaprine, methocarbamaol Patient is currently uncontrolled on the following medications: none  We  discussed:  She did feel a little better when taking methocarbamol, only took HS due to needing to drive during the day.  She reports she is not active lately, discussed benefits of stretching and exercise for pain management. She does report she feels better on days she is more active.  Plan  Recommend 20 minutes of physical activity daily   Allergies   Patient has failed these meds in past:  Patient is currently controlled on the following medications: loratadine 10 mg daily  We discussed:  Pt denies issues with allergies  Plan  Continue current medications    Tobacco use   We discussed: pt reports she quit smoking in Aug 2020 using patches. She is doing well now. Congratulated patient on this milestone and discussed benefits of being a non-smoker.  Plan  Contact providers if cravings occur   Health Maintenance   Patient is currently controlled on the following medications: multivitamin  We discussed: pt is not taking calcium and vitamin D, OB/GYN had recommended  this at last visit along with BMD scan. Pt has not yet scheduled BMD, encouraged her to do so. Also counseled to begin calcium and vitamin D supplementation.  Plan  Continue current medications  Recommended calcium-Vit D supplmentation   Medication Management   [facility] for all medications Does not pill box Pt endorses 80% compliance  We discussed: Verbal consent obtained for [facility] enhanced pharmacy services (medication synchronization, adherence packaging, delivery coordination). A medication sync plan was created to allow patient to get all medications delivered once every 30 to 90 days per patient preference. Patient understands they have freedom to choose pharmacy and clinical pharmacist will coordinate care between all prescribers and [facility].   Plan  Utilize [facility] for medication synchronization, packaging and delivery     Follow up: 6  month phone visit  Charlene Brooke, PharmD Clinical Pharmacist [facility] [phone]

## 2019-07-25 NOTE — Patient Instructions (Signed)
Visit Information  Thank you for meeting with me to discuss your medications! I look forward to working with you to achieve your health care goals. Below is a summary of what we talked about during the visit:  Goals Addressed            This Visit's Progress   . Pharmacy Care Plan       CARE PLAN ENTRY  Current Barriers:  . Chronic Disease Management support, education, and care coordination needs related to HTN and HLD, hypothyroidism, schizophrenia  Pharmacist Clinical Goal(s):  Jacqueline Berry Kitchen Over the next 180 days, patient will demonstrate Improved medication adherence as evidenced by pill packaging . Maintain BP < 140/90 . Maintain LDL < 100 . Maintain TSH in target range . Ensure safety, efficacy, and affordability of medications  Interventions: . Comprehensive medication review performed. . Counseled on lifestyle management of blood pressure and cholesterol to prevent heart disease . Utilize [facility] for medication synchronization, packaging and delivery  Patient Self Care Activities:  . Self administers medications as prescribed, Calls pharmacy for medication refills, and Calls provider office for new concerns or questions  Initial goal documentation       Jacqueline Berry was given information about Chronic Care Management services today including:  1. CCM service includes personalized support from designated clinical staff supervised by her physician, including individualized plan of care and coordination with other care providers 2. 24/7 contact phone numbers for assistance for urgent and routine care needs. 3. Standard insurance, coinsurance, copays and deductibles apply for chronic care management only during months in which we provide at least 20 minutes of these services. Most insurances cover these services at 100%, however patients may be responsible for any copay, coinsurance and/or deductible if applicable. This service may help you avoid the need for more expensive  face-to-face services. 4. Only one practitioner may furnish and bill the service in a calendar month. 5. The patient may stop CCM services at any time (effective at the end of the month) by phone call to the office staff.  Patient agreed to services and verbal consent obtained.   The patient verbalized understanding of instructions provided today and agreed to receive a mailed copy of patient instruction and/or educational materials. Telephone follow up appointment with pharmacy team member scheduled for: 6 months  Jacqueline Berry, PharmD Clinical Pharmacist [facility] [phone]   Cholesterol Content in Foods Cholesterol is a waxy, fat-like substance that helps to carry fat in the blood. The body needs cholesterol in small amounts, but too much cholesterol can cause damage to the arteries and heart. Most people should eat less than 200 milligrams (mg) of cholesterol a day.  Foods with cholesterol  Cholesterol is found in animal-based foods, such as meat, seafood, and dairy. Generally, low-fat dairy and lean meats have less cholesterol than full-fat dairy and fatty meats. The milligrams of cholesterol per serving (mg per serving) of common cholesterol-containing foods are listed below. Meat and other proteins  Egg -- one large whole egg has 186 mg.  Veal shank -- 4 oz has 141 mg.  Lean ground [place] (93% lean) -- 4 oz has 118 mg.  Fat-trimmed lamb loin -- 4 oz has 106 mg.  Lean ground beef (90% lean) -- 4 oz has 100 mg.  Lobster -- 3.5 oz has 90 mg.  Pork loin chops -- 4 oz has 86 mg.  Canned salmon -- 3.5 oz has 83 mg.  Fat-trimmed beef top loin --  4 oz has 78 mg.  Frankfurter -- 1 frank (3.5 oz) has 77 mg.  Crab -- 3.5 oz has 71 mg.  Roasted chicken without skin, white meat -- 4 oz has 66 mg.  Light bologna -- 2 oz has 45 mg.  Deli-cut [place] -- 2 oz has 31 mg.  Canned tuna -- 3.5 oz has 31 mg.  Jacqueline Berry -- 1 oz has 29 mg.  Oysters  and mussels (raw) -- 3.5 oz has 25 mg.  Mackerel -- 1 oz has 22 mg.  Trout -- 1 oz has 20 mg.  Pork sausage -- 1 link (1 oz) has 17 mg.  Salmon -- 1 oz has 16 mg.  Tilapia -- 1 oz has 14 mg. Dairy  Soft-serve ice cream --  cup (4 oz) has 103 mg.  Whole-milk yogurt -- 1 cup (8 oz) has 29 mg.  Cheddar cheese -- 1 oz has 28 mg.  American cheese -- 1 oz has 28 mg.  Whole milk -- 1 cup (8 oz) has 23 mg.  2% milk -- 1 cup (8 oz) has 18 mg.  Cream cheese -- 1 tablespoon (Tbsp) has 15 mg.  Cottage cheese --  cup (4 oz) has 14 mg.  Low-fat (1%) milk -- 1 cup (8 oz) has 10 mg.  Sour cream -- 1 Tbsp has 8.5 mg.  Low-fat yogurt -- 1 cup (8 oz) has 8 mg.  Nonfat Greek yogurt -- 1 cup (8 oz) has 7 mg.  Half-and-half cream -- 1 Tbsp has 5 mg. Fats and oils  Cod liver oil -- 1 tablespoon (Tbsp) has 82 mg.  Butter -- 1 Tbsp has 15 mg.  Lard -- 1 Tbsp has 14 mg.  Bacon grease -- 1 Tbsp has 14 mg.  Mayonnaise -- 1 Tbsp has 5-10 mg.  Margarine -- 1 Tbsp has 3-10 mg. Exact amounts of cholesterol in these foods may vary depending on specific ingredients and brands.  Foods without cholesterol Most plant-based foods do not have cholesterol unless you combine them with a food that has cholesterol. Foods without cholesterol include:  Grains and cereals.  Vegetables.  Fruits.  Vegetable oils, such as olive, canola, and sunflower oil.  Legumes, such as peas, beans, and lentils.  Nuts and seeds.  Egg whites. Summary  The body needs cholesterol in small amounts, but too much cholesterol can cause damage to the arteries and heart.  Most people should eat less than 200 milligrams (mg) of cholesterol a day. This information is not intended to replace advice given to you by your health care provider. Make sure you discuss any questions you have with your health care provider. Document Revised: 03/18/2017 Document Reviewed: 11/30/2016 Elsevier Patient Education  Jacqueline Berry.

## 2019-07-28 NOTE — Addendum Note (Signed)
Addended by: Karle Barr on: 07/28/2019 06:55 AM   Modules accepted: Orders

## 2019-07-30 ENCOUNTER — Telehealth: Payer: Self-pay

## 2019-07-30 DIAGNOSIS — E039 Hypothyroidism, unspecified: Secondary | ICD-10-CM

## 2019-07-30 MED ORDER — LISINOPRIL 40 MG PO TABS: 40.0000 mg | ORAL_TABLET | Freq: Every day | ORAL | 0 refills | Status: DC

## 2019-07-30 MED ORDER — AMLODIPINE BESYLATE 10 MG PO TABS: 10.0000 mg | ORAL_TABLET | Freq: Every day | ORAL | 0 refills | Status: DC

## 2019-07-30 MED ORDER — LEVOTHYROXINE SODIUM 100 MCG PO TABS: 100.0000 ug | ORAL_TABLET | Freq: Every day | ORAL | 0 refills | Status: DC

## 2019-07-30 NOTE — Telephone Encounter (Signed)
-----   Message from Charlton Haws, [facility] sent at 07/25/2019  8:10 PM EDT ----- Regarding: Med refills This patient is switching to [facility], can you order her med refills there?  Amlodipine 10 mg Lisinopril 40 mg Levothyroxine 100 mcg

## 2019-07-31 NOTE — Telephone Encounter (Signed)
LVM for pt to call the office and schedule an appointment with PCP.

## 2019-08-10 ENCOUNTER — Ambulatory Visit: Payer: Medicare Other | Attending: Internal Medicine

## 2019-08-10 DIAGNOSIS — Z23 Encounter for immunization: Secondary | ICD-10-CM

## 2019-08-10 NOTE — Progress Notes (Signed)
   Covid-19 Vaccination Clinic  Name:  JACKALYN KLEINSMITH    MRN: [id number] DOB: 06/21/1960  08/10/2019  Ms. Mairs was observed post Covid-19 immunization for 15 minutes without incident. She was provided with Vaccine Information Sheet and instruction to access the V-Safe system.   Ms. Dryman was instructed to call 911 with any severe reactions post vaccine: Marland Kitchen Difficulty breathing  . Swelling of face and throat  . A fast heartbeat  . A bad rash all over body  . Dizziness and weakness   Immunizations Administered    Name Date Dose VIS Date Route   Pfizer COVID-19 Vaccine 08/10/2019  2:39 PM 0.3 mL 06/13/2018 Intramuscular   Manufacturer: Anton Ruiz   Lot: [id number]   [place]: [id number]

## 2019-08-15 ENCOUNTER — Other Ambulatory Visit: Payer: Self-pay | Admitting: Internal Medicine

## 2019-08-15 ENCOUNTER — Ambulatory Visit: Payer: Self-pay

## 2019-08-17 NOTE — Progress Notes (Signed)
Pharmacist and assistant spent 1 hour coordinating care between [facility] and specialists at [facility] to verify correct doses of prescriptions and facilitate delivery of medications to patient for [place], [facility]

## 2019-09-05 ENCOUNTER — Other Ambulatory Visit: Payer: Self-pay | Admitting: Internal Medicine

## 2019-09-05 ENCOUNTER — Ambulatory Visit: Payer: Self-pay | Admitting: Pharmacist

## 2019-09-05 DIAGNOSIS — I10 Essential (primary) hypertension: Secondary | ICD-10-CM

## 2019-09-05 DIAGNOSIS — F200 Paranoid schizophrenia: Secondary | ICD-10-CM

## 2019-09-05 DIAGNOSIS — E039 Hypothyroidism, unspecified: Secondary | ICD-10-CM

## 2019-09-05 NOTE — Progress Notes (Signed)
  Chronic Care Management   Outreach Note  [id number] Name: BETHANIE RUMPLE MRN: [id number] DOB: 04-30-1960  Referred by: Hoyt Koch, MD Reason for referral : Chronic Care Management and Medication Management   Chronic Care Management   Outreach Note  [id number] Name: ILAYNA SPATH MRN: [id number] DOB: Apr 19, 1961  Referred by: Hoyt Koch, MD Reason for referral : Chronic Care Management and Medication Management   Reviewed chart for medication changes ahead of medication coordination call. . 09/04/19 Dr Geoffry Paradise ([place]): drug screening . No medication changes indicated   BP Readings from Last 3 Encounters:  07/10/19 132/80  06/19/19 (!) 128/92  05/24/19 136/86    Lab Results  Component Value Date   HGBA1C 5.6 11/24/2018     Medication management via [facility]: Patient obtains medications through Adherence Packaging  30 Days   Patient is due for next adherence delivery on: 09/06/2019 Called patient and reviewed medications and coordinated delivery.  This delivery to include: Levothyroxine 110mcg daily -Before Breakfast  Amlodipine 10mg  daily -Breakfast  Lisinopril 40mg  daily-Breakfast  Benztropine 0.5mg  twice daily -Breakfast, Bedtime  Haloperidol 5mg  daily-  tablet at bedtime   Confirmed delivery date of 09/06/19, advised patient that pharmacy will contact them the morning of delivery.   Charlene Brooke, PharmD

## 2019-10-04 ENCOUNTER — Ambulatory Visit: Payer: Medicare Other | Admitting: Pharmacist

## 2019-10-04 ENCOUNTER — Other Ambulatory Visit: Payer: Self-pay

## 2019-10-04 DIAGNOSIS — I10 Essential (primary) hypertension: Secondary | ICD-10-CM

## 2019-10-04 NOTE — Chronic Care Management (AMB) (Signed)
Chronic Care Management Pharmacy  Name: Jacqueline Berry  MRN: [id number] DOB: 28-Feb-1961  Chief Complaint/ HPI  Jacqueline Berry,  59 y.o. , female presents for their Follow-Up CCM visit with the clinical pharmacist via telephone due to COVID-19 Pandemic.  PCP : Hoyt Koch, MD  From [place], lives with mother and husband. Stiffness in legs.  Their chronic conditions include: HTN, hypothyroidism, tobacco use, schizophrenia, pain  Office Visits: 11/24/18 Dr Sharlet Salina OV: leg pain, urine odor. Rx'd Macrobid for UTI.  12/13/18 - changed lisinopril/hctz to lisinopril 40 mg to avoid low potassium.   Consult Visit: 07/10/19 NP [facility]/Dr Delilah Shan (OB/GYN): UTI tx'd with cipro x 5 days.  06/19/19 ED visit: back spasms, rx'd lidocaine patch and methocarbamol.   05/24/19 Dr Delilah Shan (OB/GYN): GYN exam normal. Rec'd DEXA scan, Vitamin D/calcium supplementation. UTI, tx'd with Bactrim  Medications: Outpatient Encounter Medications as of 10/04/2019  Medication Sig  . amLODipine (NORVASC) 10 MG tablet Take 1 tablet (10 mg total) by mouth daily. Annual appt due in [place] must see provider for future refills  . benztropine (COGENTIN) 0.5 MG tablet Take 0.5 mg by mouth daily.   . ciprofloxacin (CIPRO) 250 MG tablet Take 1 tablet (250 mg total) by mouth 2 (two) times daily.  . haloperidol (HALDOL) 5 MG tablet Take 2.5 mg by mouth daily.   . haloperidol decanoate (HALDOL DECANOATE) 50 MG/ML injection Inject 100 mg into the muscle every 28 (twenty-eight) days.  Marland Kitchen levothyroxine (SYNTHROID) 100 MCG tablet Take 1 tablet (100 mcg total) by mouth daily. Annual appt due in August must see provider for future refills  . lisinopril (ZESTRIL) 40 MG tablet Take 1 tablet (40 mg total) by mouth daily.  Marland Kitchen loratadine (ALLERGY) 10 MG tablet Take 10 mg by mouth daily.  . methocarbamol (ROBAXIN) 500 MG tablet Take 1 tablet (500 mg total) by mouth 2 (two) times daily.  . Multiple Vitamins-Minerals (MULTIVITAMIN WITH  MINERALS) tablet Take 1 tablet by mouth daily.  . Calcium Carb-Cholecalciferol (CALCIUM-VITAMIN D3) 600-400 MG-UNIT TABS Take 1 tablet by mouth daily. (Patient not taking: Reported on 10/04/2019)   No facility-administered encounter medications on file as of 10/04/2019.     Current Diagnosis/Assessment:  SDOH Interventions     Most Recent Value  SDOH Interventions  Financial Strain Interventions Intervention Not Indicated     Goals Addressed            This Visit's Progress   . Pharmacy Care Plan       CARE PLAN ENTRY  Current Barriers:  . Chronic Disease Management support, education, and care coordination needs related to Hypertension and Health maintenance   Hypertension BP Readings from Last 3 Encounters:  07/10/19 132/80  06/19/19 (!) 128/92  05/24/19 136/86 .  Pharmacist Clinical Goal(s): o Over the next 120 days, patient will work with PharmD and providers to achieve BP goal <130/80 . Current regimen:  o amlodipine 10 mg daily,  o lisinopril 40 mg daily . Interventions: o Discussed BP goal and benefits of medications for prevention of heart attack /stroke . Patient self care activities - Over the next 120 days, patient will: o Check BP 2-3 times weekly, document, and provide at future appointments o Ensure daily salt intake < 2300 mg/day  Bone Health . Pharmacist Clinical Goal(s) o Over the next 120 days, patient will work with PharmD and providers to optimize calcium and Vitamin D . Current regimen:  o Multivitamin . Interventions: o Discussed benefits of calcium and  Vitamin D for bone strength and to prevent fractures o Recommend calcium 500 mg-Vitamin D 1000 IU daily . Patient self care activities - Over the next 120 days, patient will: o Take calcium and vitamin D supplement as above  Pain/Stiffness . Pharmacist Clinical Goal(s) o Over the next 120 days, patient will work with PharmD and providers to optimize therapy . Current regimen:  o No  medications . Interventions: o Discussed benefits of daily exercise and movement . Patient self care activities - Over the next 120 days, patient will: o Partake in at least 20 minutes of physical activity daily  Medication management . Pharmacist Clinical Goal(s): o Over the next 120 days, patient will work with PharmD and providers to achieve optimal medication adherence . Current pharmacy: [facility]  . Interventions o Comprehensive medication review performed. o Utilize [facility] pharmacy for medication synchronization, packaging and delivery . Patient self care activities - Over the next 120 days, patient will: o Focus on medication adherence by pill pack o Take medications as prescribed o Report any questions or concerns to PharmD and/or provider(s)  Please see past updates related to this goal by clicking on the "Past Updates" button in the selected goal        Hypertension   Office blood pressures are  BP Readings from Last 3 Encounters:  07/10/19 132/80  06/19/19 (!) 128/92  05/24/19 136/86   Kidney Function Lab Results  Component Value Date/Time   CREATININE 0.91 11/24/2018 03:37 PM   CREATININE 0.81 02/21/2018 01:45 PM   CREATININE 0.85 10/26/2011 07:13 AM   CREATININE 1.10 10/21/2011 03:35 AM   GFR 76.57 11/24/2018 03:37 PM   GFRNONAA >60 11/03/2017 09:45 AM   GFRNONAA >60 10/26/2011 07:13 AM   GFRAA >60 11/03/2017 09:45 AM   GFRAA >60 10/26/2011 07:13 AM   K 2.9 (L) 11/24/2018 03:37 PM   K 3.4 (L) 02/21/2018 01:45 PM   K 3.8 10/26/2011 07:13 AM   K 3.2 (L) 10/21/2011 03:35 AM   Patient checks BP at home infrequently Patient home BP readings are ranging: n/a  Patient has failed these meds in the past: n/a Patient is currently controlled on the following medications:   amlodipine 10 mg daily,   lisinopril 40 mg daily  We discussed diet and exercise extensively, BP goals, primary prevention of ASCVD.  Plan  Continue current medications and control  with diet and exercise    Hypothyroidism   TSH  Date Value Ref Range Status  11/24/2018 0.69 0.35 - 4.50 uIU/mL Final    Patient has failed these meds in past: n/a Patient is currently controlled on the following medications:   levothyroxine 100 mcg daily  We discussed:  Symptoms of hypo- and hyperthyroidism. Pt reports she is tired a lot of the time, but she does have an old mattress and does not sleep that well, unlikely related to thryoid given borderline low TSH.  Plan  Continue current medications   Schizophrenia   Patient has failed these meds in past: n/a Patient is currently controlled on the following medications:   haloperidol decanoate 100 mg IM q 28 days,   haloperidol 2.5 mg (1/2 of 5 mg) daily,   benztropine 0.5 mg daily   We discussed:  Pt reports she is doing well on current doses, she gets her injection each month at [facility]. She reports Haldol IM is a little expensive but manageable.   Plan  Continue current medications   Osteoporosis screening   Last DEXA Scan:  never done before  VITD  Date Value Ref Range Status  11/24/2018 16.42 (L) 30.00 - 100.00 ng/mL Final    Patient has failed these meds in past: n/a Patient is currently uncontrolled on the following medications:   Multivitamin  We discussed:  Recommend [phone] units of vitamin D daily. Recommend 1200 mg of calcium daily from dietary and supplemental sources. Recommend weight-bearing and muscle strengthening exercises for building and maintaining bone density.  pt is not taking calcium and vitamin D, OB/GYN had recommended this at last visit along with BMD scan. Pt has not yet scheduled BMD, encouraged her to do so.   Plan  Continue current medications  Recommend calcium and Vitamin D supplement  Medication Management   Pt uses [facility] for all medications 30-day adherence packaging Pt endorses 100% compliance  We discussed: Reviewed patient's [facility] medication and  Epic medication profile assuring there are no discrepancies or gaps in therapy. Confirmed all fill dates appropriate and verified with patient that there is a sufficient quantity of all prescribed medications at home. Informed patient to call me any time if needing medications before scheduled deliveries. Medications last delivered 09/06/19 for 30 day supply, next supply due 10/10/19 since patient did not start packs for a few days after delivery.   Plan  Utilize [facility] pharmacy for medication synchronization, packaging and delivery     Follow up: 4 month phone visit  Charlene Brooke, PharmD Clinical Pharmacist [facility] [phone]

## 2019-10-04 NOTE — Patient Instructions (Addendum)
Visit Information  Phone number for Pharmacist: [phone]  Goals Addressed            This Visit's Progress   . Pharmacy Care Plan       CARE PLAN ENTRY  Current Barriers:  . Chronic Disease Management support, education, and care coordination needs related to Hypertension, Osteoarthritis, and Schizophrenia   Hypertension BP Readings from Last 3 Encounters:  07/10/19 132/80  06/19/19 (!) 128/92  05/24/19 136/86 .  Pharmacist Clinical Goal(s): o Over the next 120 days, patient will work with PharmD and providers to achieve BP goal <130/80 . Current regimen:  o amlodipine 10 mg daily,  o lisinopril 40 mg daily . Interventions: o Discussed BP goal and benefits of medications for prevention of heart attack /stroke . Patient self care activities - Over the next 120 days, patient will: o Check BP 2-3 times weekly, document, and provide at future appointments o Ensure daily salt intake < 2300 mg/day  Bone Health . Pharmacist Clinical Goal(s) o Over the next 120 days, patient will work with PharmD and providers to optimize calcium and Vitamin D . Current regimen:  o Multivitamin . Interventions: o Discussed benefits of calcium and Vitamin D for bone strength and to prevent fractures o Recommend calcium 500 mg-Vitamin D 1000 IU daily . Patient self care activities - Over the next 120 days, patient will: o Take calcium and vitamin D supplement as above  Pain/Stiffness . Pharmacist Clinical Goal(s) o Over the next 120 days, patient will work with PharmD and providers to optimize therapy . Current regimen:  o No medications . Interventions: o Discussed benefits of daily exercise and movement . Patient self care activities - Over the next 120 days, patient will: o Partake in at least 20 minutes of physical activity daily  Medication management . Pharmacist Clinical Goal(s): o Over the next 120 days, patient will work with PharmD and providers to achieve optimal medication  adherence . Current pharmacy: [facility]  . Interventions o Comprehensive medication review performed. o Utilize [facility] pharmacy for medication synchronization, packaging and delivery . Patient self care activities - Over the next 120 days, patient will: o Focus on medication adherence by pill pack o Take medications as prescribed o Report any questions or concerns to PharmD and/or provider(s)  Please see past updates related to this goal by clicking on the "Past Updates" button in the selected goal       The patient verbalized understanding of instructions provided today and agreed to receive a mailed copy of patient instruction and/or educational materials.  Telephone follow up appointment with pharmacy team member scheduled for: 4 months  Charlene Brooke, PharmD Clinical Pharmacist [facility] protect organs, store calcium, anchor muscles, and support the whole body. Keeping your bones strong is important, especially as you get older. You can take actions to help keep your bones strong and healthy. Why is keeping my bones healthy important?  Keeping your bones healthy is important because your body constantly replaces bone cells. Cells get old, and new cells take their place. As we age, we lose bone cells because the body may not be able to make enough new cells to replace the old cells. The amount of bone cells and bone tissue you have is referred to as bone mass. The higher your bone mass, the stronger your bones. The aging process leads to an overall loss of bone mass in the body, which  can increase the likelihood of:  Joint pain and stiffness.  Broken bones.  A condition in which the bones become weak and brittle (osteoporosis). A large decline in bone mass occurs in older adults. In women, it occurs about the time of menopause. What actions can I take to keep my bones healthy? Good health habits are important  for maintaining healthy bones. This includes eating nutritious foods and exercising regularly. To have healthy bones, you need to get enough of the right minerals and vitamins. Most nutrition experts recommend getting these nutrients from the foods that you eat. In some cases, taking supplements may also be recommended. Doing certain types of exercise is also important for bone health. What are the nutritional recommendations for healthy bones?  Eating a well-balanced diet with plenty of calcium and vitamin D will help to protect your bones. Nutritional recommendations vary from person to person. Ask your health care provider what is healthy for you. Here are some general guidelines. Get enough calcium Calcium is the most important (essential) mineral for bone health. Most people can get enough calcium from their diet, but supplements may be recommended for people who are at risk for osteoporosis. Good sources of calcium include:  Dairy products, such as low-fat or nonfat milk, cheese, and yogurt.  Dark green leafy vegetables, such as bok choy and broccoli.  Calcium-fortified foods, such as orange juice, cereal, bread, soy beverages, and tofu products.  Nuts, such as almonds. Follow these recommended amounts for daily calcium intake:  Children, age 67-3: 700 mg.  Children, age 59-8: 1,000 mg.  Children, age 594-13: 1,300 mg.  Teens, age 65-18: 1,300 mg.  Adults, age 676-50: 1,000 mg.  Adults, age 679-70: ? Men: 1,000 mg. ? Women: 1,200 mg.  Adults, age 55 or older: 1,200 mg.  Pregnant and breastfeeding females: ? Teens: 1,300 mg. ? Adults: 1,000 mg. Get enough vitamin D Vitamin D is the most essential vitamin for bone health. It helps the body absorb calcium. Sunlight stimulates the skin to make vitamin D, so be sure to get enough sunlight. If you live in a cold climate or you do not get outside often, your health care provider may recommend that you take vitamin D supplements. Good  sources of vitamin D in your diet include:  Egg yolks.  Saltwater fish.  Milk and cereal fortified with vitamin D. Follow these recommended amounts for daily vitamin D intake:  Children and teens, age 67-18: 600 international units.  Adults, age 39 or younger: 400-800 international units.  Adults, age 30 or older: 800-1,000 international units. Get other important nutrients Other nutrients that are important for bone health include:  Phosphorus. This mineral is found in meat, poultry, dairy foods, nuts, and legumes. The recommended daily intake for adult men and adult women is 700 mg.  Magnesium. This mineral is found in seeds, nuts, dark green vegetables, and legumes. The recommended daily intake for adult men is 400-420 mg. For adult women, it is 310-320 mg.  Vitamin K. This vitamin is found in green leafy vegetables. The recommended daily intake is 120 mg for adult men and 90 mg for adult women. What type of physical activity is best for building and maintaining healthy bones? Weight-bearing and strength-building activities are important for building and maintaining healthy bones. Weight-bearing activities cause muscles and bones to work against gravity. Strength-building activities increase the strength of the muscles that support bones. Weight-bearing and muscle-building activities include:  Walking and hiking.  Jogging and running.  Dancing.  Gym exercises.  Lifting weights.  Tennis and racquetball.  Climbing stairs.  Aerobics. Adults should get at least 30 minutes of moderate physical activity on most days. Children should get at least 60 minutes of moderate physical activity on most days. Ask your health care provider what type of exercise is best for you. How can I find out if my bone mass is low? Bone mass can be measured with an X-ray test called a bone mineral density (BMD) test. This test is recommended for all women who are age 41 or older. It may also be  recommended for:  Men who are age 18 or older.  People who are at risk for osteoporosis because of: ? Having bones that break easily. ? Having a long-term disease that weakens bones, such as kidney disease or rheumatoid arthritis. ? Having menopause earlier than normal. ? Taking medicine that weakens bones, such as steroids, thyroid hormones, or hormone treatment for breast cancer or prostate cancer. ? Smoking. ? Drinking three or more alcoholic drinks a day. If you find that you have a low bone mass, you may be able to prevent osteoporosis or further bone loss by changing your diet and lifestyle. Where can I find more information? For more information, check out the following websites:  [place]: AviationTales.fr  [employer] of Health: www.bones.SouthExposed.es  International Osteoporosis Foundation: Administrator.iofbonehealth.org Summary  The aging process leads to an overall loss of bone mass in the body, which can increase the likelihood of broken bones and osteoporosis.  Eating a well-balanced diet with plenty of calcium and vitamin D will help to protect your bones.  Weight-bearing and strength-building activities are also important for building and maintaining strong bones.  Bone mass can be measured with an X-ray test called a bone mineral density (BMD) test. This information is not intended to replace advice given to you by your health care provider. Make sure you discuss any questions you have with your health care provider. Document Revised: 05/02/2017 Document Reviewed: 05/02/2017 Elsevier Patient Education  2020 [employer].

## 2019-10-29 ENCOUNTER — Telehealth: Payer: Self-pay

## 2019-10-29 ENCOUNTER — Ambulatory Visit: Payer: Self-pay | Admitting: Pharmacist

## 2019-10-29 DIAGNOSIS — I10 Essential (primary) hypertension: Secondary | ICD-10-CM

## 2019-10-29 DIAGNOSIS — F200 Paranoid schizophrenia: Secondary | ICD-10-CM

## 2019-10-29 MED ORDER — LISINOPRIL 40 MG PO TABS: 40.0000 mg | ORAL_TABLET | Freq: Every day | ORAL | 0 refills | Status: DC

## 2019-10-29 NOTE — Telephone Encounter (Signed)
-----   Message from Charlton Haws, [facility] sent at 10/29/2019  1:42 PM EDT ----- Ms Kalman Shan needs refills for lisinopril, I see she is due for her annual appt in August. Can you order a refill for lisinopril and help schedule her with Dr Sharlet Salina?

## 2019-10-29 NOTE — Chronic Care Management (AMB) (Signed)
  Chronic Care Management   Outreach Note  08/19/8880 Name: Jacqueline Berry MRN: [id number] DOB: [birth year]  Referred by: Hoyt Koch, MD Reason for referral : Chronic Care Management and Medication Management   Reviewed chart for medication changes ahead of medication coordination call. . No Office visits, Consults, or Hospital visits since last care coordination call/Pharmacist visit.  . No medication changes indicated   BP Readings from Last 3 Encounters:  07/10/19 132/80  06/19/19 (!) 128/92  05/24/19 136/86    Lab Results  Component Value Date   HGBA1C 5.6 11/24/2018     Medication management via [facility] pharmacy: Patient obtains medications through Adherence Packaging  30 Days   Patient is due for next adherence delivery on: 11/01/19 Called patient and reviewed medications and coordinated delivery.  This delivery to include: Benztropine 0.5mg  twice daily-Breakfast, Bedtime  Haloperidol 5mg   tablet daily- Bedtime  Amlodipine 10mg  daily-Breakfast  Lisinopril 40mg  daily-Breakfast  Levothyroxine 193mcg daily -Before Breakfast  Patient declined the following medications: none  Patient needs refills for Lisinopril 40 mg.  Confirmed delivery date of 11/01/19, advised patient that pharmacy will contact them the morning of delivery.   Charlene Brooke, PharmD

## 2019-10-30 ENCOUNTER — Other Ambulatory Visit: Payer: Self-pay | Admitting: Internal Medicine

## 2019-11-01 ENCOUNTER — Other Ambulatory Visit: Payer: Self-pay | Admitting: Physician Assistant

## 2019-11-01 ENCOUNTER — Other Ambulatory Visit: Payer: Self-pay | Admitting: Internal Medicine

## 2019-11-02 ENCOUNTER — Telehealth: Payer: Self-pay

## 2019-11-02 ENCOUNTER — Telehealth: Payer: Medicare Other | Admitting: Internal Medicine

## 2019-11-02 DIAGNOSIS — E039 Hypothyroidism, unspecified: Secondary | ICD-10-CM

## 2019-11-02 MED ORDER — AMLODIPINE BESYLATE 10 MG PO TABS: 10.0000 mg | ORAL_TABLET | Freq: Every morning | ORAL | 0 refills | Status: DC

## 2019-11-02 MED ORDER — LISINOPRIL 40 MG PO TABS: 40.0000 mg | ORAL_TABLET | Freq: Every day | ORAL | 0 refills | Status: DC

## 2019-11-02 MED ORDER — LEVOTHYROXINE SODIUM 100 MCG PO TABS: 100.0000 ug | ORAL_TABLET | Freq: Every day | ORAL | 0 refills | Status: DC

## 2019-11-02 NOTE — Telephone Encounter (Signed)
New message    Returning call back to CMA from this morning.

## 2019-11-02 NOTE — Telephone Encounter (Signed)
Pt has been scheduled for Friday 11/02/2019 at 1:30pm as a video visit.   Per Dr. Sharlet Salina, a medication refill visit should be done F2F unless there are signs/symptoms of COVID.   I have left a message for pt to call back and that it was regarding her appointment today.   If pt call back please inform she will need to come into the office. If pt refuses, I will take call and go over any other questions that she has.

## 2019-11-02 NOTE — Telephone Encounter (Signed)
Fine to send in 30 day supply, seen with labs 11/2018 so not even at 1 year yet

## 2019-11-02 NOTE — Telephone Encounter (Signed)
Patient needs ov for any further refills.

## 2019-11-02 NOTE — Telephone Encounter (Signed)
F/u   Appt is r/s to an office visit verse an virtual appt . Patient is concerns that she will be out of her medication asking what should she do in the mean time.   1.Medication Requested:  levothyroxine (SYNTHROID) 100 MCG tablet lisinopril (ZESTRIL) 40 MG tablet  amLODipine (NORVASC) 10 MG tablet  2. Pharmacy (Name, Street, City):[id number] - [place], [place] [address] [place]  3. On Med List: Yes   4. Last Visit with PCP: 8/20   5. Next visit date with PCP: 7.27.21    Agent: Please be advised that RX refills may take up to 3 business days. We ask that you follow-up with your pharmacy.

## 2019-11-02 NOTE — Telephone Encounter (Signed)
erx sent for [facility].

## 2019-11-13 ENCOUNTER — Ambulatory Visit: Payer: Medicare Other | Admitting: Internal Medicine

## 2019-12-31 ENCOUNTER — Encounter: Payer: Self-pay | Admitting: Internal Medicine

## 2019-12-31 ENCOUNTER — Ambulatory Visit (INDEPENDENT_AMBULATORY_CARE_PROVIDER_SITE_OTHER): Payer: Medicare Other | Admitting: Internal Medicine

## 2019-12-31 ENCOUNTER — Other Ambulatory Visit: Payer: Self-pay

## 2019-12-31 VITALS — BP 134/86 | HR 92 | Temp 98.3°F | Resp 16 | Ht 63.0 in | Wt 204.0 lb

## 2019-12-31 DIAGNOSIS — I10 Essential (primary) hypertension: Secondary | ICD-10-CM | POA: Diagnosis not present

## 2019-12-31 DIAGNOSIS — Z0000 Encounter for general adult medical examination without abnormal findings: Secondary | ICD-10-CM

## 2019-12-31 DIAGNOSIS — E785 Hyperlipidemia, unspecified: Secondary | ICD-10-CM

## 2019-12-31 DIAGNOSIS — E876 Hypokalemia: Secondary | ICD-10-CM | POA: Diagnosis not present

## 2019-12-31 DIAGNOSIS — E039 Hypothyroidism, unspecified: Secondary | ICD-10-CM

## 2019-12-31 NOTE — Patient Instructions (Signed)
Health Maintenance, Female Adopting a healthy lifestyle and getting preventive care are important in promoting health and wellness. Ask your health care provider about:  The right schedule for you to have regular tests and exams.  Things you can do on your own to prevent diseases and keep yourself healthy. What should I know about diet, weight, and exercise? Eat a healthy diet   Eat a diet that includes plenty of vegetables, fruits, low-fat dairy products, and lean protein.  Do not eat a lot of foods that are high in solid fats, added sugars, or sodium. Maintain a healthy weight Body mass index (BMI) is used to identify weight problems. It estimates body fat based on height and weight. Your health care provider can help determine your BMI and help you achieve or maintain a healthy weight. Get regular exercise Get regular exercise. This is one of the most important things you can do for your health. Most adults should:  Exercise for at least 150 minutes each week. The exercise should increase your heart rate and make you sweat (moderate-intensity exercise).  Do strengthening exercises at least twice a week. This is in addition to the moderate-intensity exercise.  Spend less time sitting. Even light physical activity can be beneficial. Watch cholesterol and blood lipids Have your blood tested for lipids and cholesterol at 60 years of age, then have this test every 5 years. Have your cholesterol levels checked more often if:  Your lipid or cholesterol levels are high.  You are older than 60 years of age.  You are at high risk for heart disease. What should I know about cancer screening? Depending on your health history and family history, you may need to have cancer screening at various ages. This may include screening for:  Breast cancer.  Cervical cancer.  Colorectal cancer.  Skin cancer.  Lung cancer. What should I know about heart disease, diabetes, and high blood  pressure? Blood pressure and heart disease  High blood pressure causes heart disease and increases the risk of stroke. This is more likely to develop in people who have high blood pressure readings, are of African descent, or are overweight.  Have your blood pressure checked: ? Every 3-5 years if you are 18-39 years of age. ? Every year if you are 40 years old or older. Diabetes Have regular diabetes screenings. This checks your fasting blood sugar level. Have the screening done:  Once every three years after age 40 if you are at a normal weight and have a low risk for diabetes.  More often and at a younger age if you are overweight or have a high risk for diabetes. What should I know about preventing infection? Hepatitis B If you have a higher risk for hepatitis B, you should be screened for this virus. Talk with your health care provider to find out if you are at risk for hepatitis B infection. Hepatitis C Testing is recommended for:  Everyone born from 1945 through 1965.  Anyone with known risk factors for hepatitis C. Sexually transmitted infections (STIs)  Get screened for STIs, including gonorrhea and chlamydia, if: ? You are sexually active and are younger than 60 years of age. ? You are older than 60 years of age and your health care provider tells you that you are at risk for this type of infection. ? Your sexual activity has changed since you were last screened, and you are at increased risk for chlamydia or gonorrhea. Ask your health care provider if   you are at risk.  Ask your health care provider about whether you are at high risk for HIV. Your health care provider may recommend a prescription medicine to help prevent HIV infection. If you choose to take medicine to prevent HIV, you should first get tested for HIV. You should then be tested every 3 months for as long as you are taking the medicine. Pregnancy  If you are about to stop having your period (premenopausal) and  you may become pregnant, seek counseling before you get pregnant.  Take 400 to 800 micrograms (mcg) of folic acid every day if you become pregnant.  Ask for birth control (contraception) if you want to prevent pregnancy. Osteoporosis and menopause Osteoporosis is a disease in which the bones lose minerals and strength with aging. This can result in bone fractures. If you are 65 years old or older, or if you are at risk for osteoporosis and fractures, ask your health care provider if you should:  Be screened for bone loss.  Take a calcium or vitamin D supplement to lower your risk of fractures.  Be given hormone replacement therapy (HRT) to treat symptoms of menopause. Follow these instructions at home: Lifestyle  Do not use any products that contain nicotine or tobacco, such as cigarettes, e-cigarettes, and chewing tobacco. If you need help quitting, ask your health care provider.  Do not use street drugs.  Do not share needles.  Ask your health care provider for help if you need support or information about quitting drugs. Alcohol use  Do not drink alcohol if: ? Your health care provider tells you not to drink. ? You are pregnant, may be pregnant, or are planning to become pregnant.  If you drink alcohol: ? Limit how much you use to 0-1 drink a day. ? Limit intake if you are breastfeeding.  Be aware of how much alcohol is in your drink. In the [place] one drink equals one 12 oz bottle of beer (355 mL), one 5 oz glass of wine (148 mL), or one 1 oz glass of hard liquor (44 mL). General instructions  Schedule regular health, dental, and eye exams.  Stay current with your vaccines.  Tell your health care provider if: ? You often feel depressed. ? You have ever been abused or do not feel safe at home. Summary  Adopting a healthy lifestyle and getting preventive care are important in promoting health and wellness.  Follow your health care provider's instructions about healthy  diet, exercising, and getting tested or screened for diseases.  Follow your health care provider's instructions on monitoring your cholesterol and blood pressure. This information is not intended to replace advice given to you by your health care provider. Make sure you discuss any questions you have with your health care provider. Document Revised: 03/29/2018 Document Reviewed: 03/29/2018 Elsevier Patient Education  2020 [employer] Inc.  

## 2019-12-31 NOTE — Progress Notes (Signed)
Subjective:  Patient ID: Jacqueline Berry, female    DOB: 11-May-1960  Age: 59 y.o. MRN: [id number]  CC: Annual Exam, Hypertension, Hyperlipidemia, and Hypothyroidism  This visit occurred during the SARS-CoV-2 public health emergency.  Safety protocols were in place, including screening questions prior to the visit, additional usage of staff PPE, and extensive cleaning of exam room while observing appropriate contact time as indicated for disinfecting solutions.   NEW TO ME  HPI Jacqueline Berry presents for a CPX.  She complains of sleepiness and fatigue but denies weight gain, constipation, edema, or dizziness.  She tells me she is taking her antihypertensives but has not been working on her lifestyle modifications.  She denies headache, blurred vision, dizziness, or lightheadedness.  Outpatient Medications Prior to Visit  Medication Sig Dispense Refill  . amLODipine (NORVASC) 10 MG tablet Take 1 tablet (10 mg total) by mouth every morning. 30 tablet 0  . benztropine (COGENTIN) 0.5 MG tablet Take 0.5 mg by mouth daily.     . Calcium Carb-Cholecalciferol (CALCIUM-VITAMIN D3) 600-400 MG-UNIT TABS Take 1 tablet by mouth daily.     . haloperidol (HALDOL) 5 MG tablet Take 2.5 mg by mouth daily.     . haloperidol decanoate (HALDOL DECANOATE) 50 MG/ML injection Inject 100 mg into the muscle every 28 (twenty-eight) days.    Marland Kitchen lisinopril-hydrochlorothiazide (ZESTORETIC) 20-12.5 MG tablet TAKE 1 TABLET BY MOUTH EVERY DAY 90 tablet 0  . loratadine (ALLERGY) 10 MG tablet Take 10 mg by mouth daily.    . Multiple Vitamins-Minerals (MULTIVITAMIN WITH MINERALS) tablet Take 1 tablet by mouth daily.    . ciprofloxacin (CIPRO) 250 MG tablet Take 1 tablet (250 mg total) by mouth 2 (two) times daily. 10 tablet 0  . levothyroxine (SYNTHROID) 100 MCG tablet Take 1 tablet (100 mcg total) by mouth daily. 30 tablet 0  . lisinopril (ZESTRIL) 40 MG tablet Take 1 tablet (40 mg total) by mouth daily. 30 tablet 0  .  methocarbamol (ROBAXIN) 500 MG tablet Take 1 tablet (500 mg total) by mouth 2 (two) times daily. 20 tablet 0   No facility-administered medications prior to visit.    ROS Review of Systems  Constitutional: Positive for fatigue. Negative for appetite change, diaphoresis and unexpected weight change.  HENT: Negative.   Eyes: Negative for visual disturbance.  Respiratory: Negative for cough, chest tightness, shortness of breath and wheezing.   Cardiovascular: Negative for chest pain, palpitations and leg swelling.  Gastrointestinal: Negative for abdominal pain, constipation, diarrhea, nausea and vomiting.  Endocrine: Negative for cold intolerance and heat intolerance.  Genitourinary: Negative.  Negative for difficulty urinating.  Musculoskeletal: Negative for arthralgias and myalgias.  Skin: Negative.  Negative for color change and pallor.  Neurological: Negative.  Negative for dizziness, weakness and light-headedness.  Hematological: Negative for adenopathy. Does not bruise/bleed easily.  Psychiatric/Behavioral: Negative.     Objective:  BP 134/86   Pulse 92   Temp 98.3 F (36.8 C) (Oral)   Resp 16   Ht 5\' 3"  (1.6 m)   Wt 204 lb (92.5 kg)   LMP 02/18/2011   SpO2 97%   BMI 36.14 kg/m   BP Readings from Last 3 Encounters:  12/31/19 134/86  07/10/19 132/80  06/19/19 (!) 128/92    Wt Readings from Last 3 Encounters:  12/31/19 204 lb (92.5 kg)  06/19/19 205 lb 0.4 oz (93 kg)  05/24/19 204 lb (92.5 kg)    Physical Exam Vitals reviewed.  Constitutional:  Appearance: Normal appearance.  HENT:     Nose: Nose normal.     Mouth/Throat:     Mouth: Mucous membranes are moist.  Eyes:     General: No scleral icterus.    Conjunctiva/sclera: Conjunctivae normal.  Cardiovascular:     Rate and Rhythm: Normal rate and regular rhythm.     Heart sounds: No murmur heard.   Pulmonary:     Effort: Pulmonary effort is normal.     Breath sounds: No stridor. No wheezing, rhonchi  or rales.  Abdominal:     General: Abdomen is flat.     Palpations: There is no mass.     Tenderness: There is no abdominal tenderness. There is no guarding.  Musculoskeletal:        General: Normal range of motion.     Cervical back: Neck supple.     Right lower leg: No edema.     Left lower leg: No edema.  Lymphadenopathy:     Cervical: No cervical adenopathy.  Skin:    General: Skin is warm and dry.  Neurological:     General: No focal deficit present.     Mental Status: She is alert.  Psychiatric:        Mood and Affect: Mood normal.        Behavior: Behavior normal.     Lab Results  Component Value Date   WBC 6.3 12/31/2019   HGB 13.0 12/31/2019   HCT 41.0 12/31/2019   PLT 285 12/31/2019   GLUCOSE 104 (H) 12/31/2019   CHOL 154 12/31/2019   TRIG 101 12/31/2019   HDL 41 (L) 12/31/2019   LDLDIRECT 97.0 09/29/2017   LDLCALC 94 12/31/2019   ALT 19 12/31/2019   AST 15 12/31/2019   NA 142 12/31/2019   K 4.0 12/31/2019   CL 106 12/31/2019   CREATININE 0.80 12/31/2019   BUN 9 12/31/2019   CO2 29 12/31/2019   TSH 2.67 12/31/2019   HGBA1C 5.6 11/24/2018   MICROALBUR 3.59 (H) 05/06/2010    No results found.  Assessment & Plan:   [name] was seen today for annual exam, hypertension, hyperlipidemia and hypothyroidism.  Diagnoses and all orders for this visit:  Essential hypertension- Her blood pressure is adequately well controlled.  Electrolytes and renal function are normal. -     CBC with Differential/Platelet; Future -     BASIC METABOLIC PANEL WITH GFR; Future -     TSH; Future -     Magnesium; Future -     Magnesium -     TSH -     BASIC METABOLIC PANEL WITH GFR -     CBC with Differential/Platelet  Hypothyroidism, unspecified type- Her TSH is in the normal range. She will stay on the current T4 dose. -     TSH; Future -     TSH  Hypokalemia- Her K+ is normal now. -     BASIC METABOLIC PANEL WITH GFR; Future -     Cancel: Magnesium; Future -      Magnesium; Future -     Magnesium -     BASIC METABOLIC PANEL WITH GFR  Routine general medical examination at a health care facility- Exam completed, labs reviewed, vaccines reviewed-she refused a flu vaccine, cancer screenings are up-to-date, patient education was given.  Hyperlipidemia LDL goal <130- She does not have an elevated ASCVD risk score so I did not recommend a statin for CV risk reduction. -     Lipid  panel; Future -     TSH; Future -     Hepatic function panel; Future -     Hepatic function panel -     TSH -     Lipid panel   I have discontinued Lailany R. Malcolm's methocarbamol, ciprofloxacin, and lisinopril. I am also having her maintain her haloperidol decanoate, benztropine, haloperidol, loratadine, multivitamin with minerals, Calcium-Vitamin D3, lisinopril-hydrochlorothiazide, amLODipine, and levothyroxine.  Meds ordered this encounter  Medications  . levothyroxine (SYNTHROID) 100 MCG tablet    Sig: Take 1 tablet (100 mcg total) by mouth daily.    Dispense:  90 tablet    Refill:  0     Follow-up: Return in about 3 months (around 03/31/2020).  Scarlette Calico, MD

## 2020-01-01 LAB — LIPID PANEL
Cholesterol: 154 mg/dL (ref <200)
HDL: 41 mg/dL — ABNORMAL LOW (ref >=50)
LDL Cholesterol (Calc): 94 mg/dL (calc)
Non-HDL Cholesterol (Calc): 113 mg/dL (calc) (ref <130)
Total CHOL/HDL Ratio: 3.8 (calc) (ref <5.0)
Triglycerides: 101 mg/dL (ref <150)

## 2020-01-01 LAB — CBC WITH DIFFERENTIAL/PLATELET
Absolute Monocytes: 491 cells/uL (ref 200–950)
Basophils Absolute: 69 cells/uL (ref 0–200)
Basophils Relative: 1.1 %
Eosinophils Absolute: 132 cells/uL (ref 15–500)
Eosinophils Relative: 2.1 %
HCT: 41 % (ref 35.0–45.0)
Hemoglobin: 13 g/dL (ref 11.7–15.5)
Lymphs Abs: 3119 cells/uL (ref 850–3900)
MCH: 26.8 pg — ABNORMAL LOW (ref 27.0–33.0)
MCHC: 31.7 g/dL — ABNORMAL LOW (ref 32.0–36.0)
MCV: 84.5 fL (ref 80.0–100.0)
MPV: 10.7 fL (ref 7.5–12.5)
Monocytes Relative: 7.8 %
Neutro Abs: 2489 cells/uL (ref 1500–7800)
Neutrophils Relative %: 39.5 %
Platelets: 285 10*3/uL (ref 140–400)
RBC: 4.85 10*6/uL (ref 3.80–5.10)
RDW: 14.8 % (ref 11.0–15.0)
Total Lymphocyte: 49.5 %
WBC: 6.3 10*3/uL (ref 3.8–10.8)

## 2020-01-01 LAB — BASIC METABOLIC PANEL WITH GFR
BUN: 9 mg/dL (ref 7–25)
CO2: 29 mmol/L (ref 20–32)
Calcium: 9.7 mg/dL (ref 8.6–10.4)
Chloride: 106 mmol/L (ref 98–110)
Creat: 0.8 mg/dL (ref 0.50–0.99)
GFR, Est African American: 93 mL/min/{1.73_m2} (ref >=60)
GFR, Est Non African American: 80 mL/min/{1.73_m2} (ref >=60)
Glucose, Bld: 104 mg/dL — ABNORMAL HIGH (ref 65–99)
Potassium: 4 mmol/L (ref 3.5–5.3)
Sodium: 142 mmol/L (ref 135–146)

## 2020-01-01 LAB — HEPATIC FUNCTION PANEL
AG Ratio: 1.4 (calc) (ref 1.0–2.5)
ALT: 19 U/L (ref 6–29)
AST: 15 U/L (ref 10–35)
Albumin: 4.2 g/dL (ref 3.6–5.1)
Alkaline phosphatase (APISO): 103 U/L (ref 37–153)
Bilirubin, Direct: 0.2 mg/dL (ref 0.0–0.2)
Globulin: 3.1 g/dL (calc) (ref 1.9–3.7)
Indirect Bilirubin: 0.4 mg/dL (calc) (ref 0.2–1.2)
Total Bilirubin: 0.6 mg/dL (ref 0.2–1.2)
Total Protein: 7.3 g/dL (ref 6.1–8.1)

## 2020-01-01 LAB — MAGNESIUM: Magnesium: 2.1 mg/dL (ref 1.5–2.5)

## 2020-01-01 LAB — TSH: TSH: 2.67 mIU/L (ref 0.40–4.50)

## 2020-01-01 MED ORDER — LEVOTHYROXINE SODIUM 100 MCG PO TABS: 100.0000 ug | ORAL_TABLET | Freq: Every day | ORAL | 0 refills | Status: DC

## 2020-01-21 ENCOUNTER — Telehealth: Payer: Medicare Other

## 2020-01-30 ENCOUNTER — Telehealth: Payer: Medicare Other

## 2020-01-30 NOTE — Chronic Care Management (AMB) (Deleted)
Chronic Care Management Pharmacy  Name: YANILEN ADAMIK  MRN: [id number] DOB: [birth year]  Chief Complaint/ HPI  [name],  59 y.o. , female presents for their Follow-Up CCM visit with the clinical pharmacist via telephone due to COVID-19 Pandemic.  PCP : Hoyt Koch, MD  From [place], lives with mother and husband. Stiffness in legs.  Their chronic conditions include: HTN, hypothyroidism, tobacco use, schizophrenia, pain  Office Visits: 12/31/19 Dr Ronnald Ramp OV: CPX, c/o sleepiness/fatigue  11/24/18 Dr Sharlet Salina OV: leg pain, urine odor. Rx'd Macrobid for UTI.  12/13/18 - changed lisinopril/hctz to lisinopril 40 mg to avoid low potassium.   Consult Visit: 07/10/19 NP Young/Dr Delilah Shan (OB/GYN): UTI tx'd with cipro x 5 days.  06/19/19 ED visit: back spasms, rx'd lidocaine patch and methocarbamol.   05/24/19 Dr Delilah Shan (OB/GYN): GYN exam normal. Rec'd DEXA scan, Vitamin D/calcium supplementation. UTI, tx'd with Bactrim  Medications: Outpatient Encounter Medications as of 01/30/2020  Medication Sig   amLODipine (NORVASC) 10 MG tablet Take 1 tablet (10 mg total) by mouth every morning.   benztropine (COGENTIN) 0.5 MG tablet Take 0.5 mg by mouth daily.    Calcium Carb-Cholecalciferol (CALCIUM-VITAMIN D3) 600-400 MG-UNIT TABS Take 1 tablet by mouth daily.    haloperidol (HALDOL) 5 MG tablet Take 2.5 mg by mouth daily.    haloperidol decanoate (HALDOL DECANOATE) 50 MG/ML injection Inject 100 mg into the muscle every 28 (twenty-eight) days.   levothyroxine (SYNTHROID) 100 MCG tablet Take 1 tablet (100 mcg total) by mouth daily.   lisinopril-hydrochlorothiazide (ZESTORETIC) 20-12.5 MG tablet TAKE 1 TABLET BY MOUTH EVERY DAY   loratadine (ALLERGY) 10 MG tablet Take 10 mg by mouth daily.   Multiple Vitamins-Minerals (MULTIVITAMIN WITH MINERALS) tablet Take 1 tablet by mouth daily.   No facility-administered encounter medications on file as of 01/30/2020.     Current  Diagnosis/Assessment:   Goals Addressed   None     Hypertension   Office blood pressures are  BP Readings from Last 3 Encounters:  12/31/19 134/86  07/10/19 132/80  06/19/19 (!) 128/92   Kidney Function Lab Results  Component Value Date/Time   CREATININE 0.80 12/31/2019 08:24 AM   CREATININE 0.91 11/24/2018 03:37 PM   CREATININE 0.81 02/21/2018 01:45 PM   CREATININE 0.85 10/26/2011 07:13 AM   CREATININE 1.10 10/21/2011 03:35 AM   GFR 76.57 11/24/2018 03:37 PM   GFRNONAA 80 12/31/2019 08:24 AM   GFRAA 93 12/31/2019 08:24 AM   K 4.0 12/31/2019 08:24 AM   K 2.9 (L) 11/24/2018 03:37 PM   K 3.8 10/26/2011 07:13 AM   K 3.2 (L) 10/21/2011 03:35 AM   Patient checks BP at home infrequently Patient home BP readings are ranging: n/a  Patient has failed these meds in the past: n/a Patient is currently controlled on the following medications:   amlodipine 10 mg daily,   Lisinopril 40 mg daily  We discussed diet and exercise extensively, BP goals, primary prevention of ASCVD.  Plan  Continue current medications and control with diet and exercise    Hypothyroidism   TSH  Date Value Ref Range Status  12/31/2019 2.67 0.40 - 4.50 mIU/L Final    Patient has failed these meds in past: n/a Patient is currently controlled on the following medications:   levothyroxine 100 mcg daily  We discussed:  Symptoms of hypo- and hyperthyroidism. Pt reports she is tired a lot of the time, but she does have an old mattress and does not sleep that well,  unlikely related to thryoid given borderline low TSH.  Plan  Continue current medications   Schizophrenia   Patient has failed these meds in past: n/a Patient is currently controlled on the following medications:   haloperidol decanoate 100 mg IM q 28 days,   haloperidol 2.5 mg (1/2 of 5 mg) daily,   benztropine 0.5 mg daily   We discussed:  Pt reports she is doing well on current doses, she gets her injection each month at  [facility]. She reports Haldol IM is a little expensive but manageable.   Plan  Continue current medications   Osteoporosis screening   Last DEXA Scan: never done before  VITD  Date Value Ref Range Status  11/24/2018 16.42 (L) 30.00 - 100.00 ng/mL Final    Patient has failed these meds in past: n/a Patient is currently uncontrolled on the following medications:   Multivitamin  We discussed:  Recommend [id number] units of vitamin D daily. Recommend 1200 mg of calcium daily from dietary and supplemental sources. Recommend weight-bearing and muscle strengthening exercises for building and maintaining bone density.  pt is not taking calcium and vitamin D, OB/GYN had recommended this at last visit along with BMD scan. Pt has not yet scheduled BMD, encouraged her to do so.   Plan  Continue current medications  Recommend calcium and Vitamin D supplement  Medication Management   Pt uses [facility] for all medications Uses pill box? {Yes or If no, why not?:20788} Pt endorses ***% compliance  We discussed: Current pharmacy is preferred with insurance plan and patient is satisfied with pharmacy services  Plan  Continue current medication management strategy    Follow up: *** month phone visit  ***

## 2020-02-08 ENCOUNTER — Telehealth: Payer: Self-pay | Admitting: Internal Medicine

## 2020-02-08 NOTE — Telephone Encounter (Signed)
Patient wondering if she can take a memory loss supplement.

## 2020-02-09 NOTE — Telephone Encounter (Signed)
She can try [employer] Mushroom capsules for memory. Thx

## 2020-02-11 NOTE — Telephone Encounter (Signed)
Called pt, LVM.   

## 2020-02-27 ENCOUNTER — Ambulatory Visit: Payer: Medicare Other | Admitting: Orthopedic Surgery

## 2020-02-28 ENCOUNTER — Ambulatory Visit: Payer: Medicare Other | Admitting: Orthopedic Surgery

## 2020-04-29 ENCOUNTER — Other Ambulatory Visit: Payer: Self-pay | Admitting: Internal Medicine

## 2020-05-16 ENCOUNTER — Other Ambulatory Visit: Payer: Self-pay | Admitting: Internal Medicine

## 2020-05-16 ENCOUNTER — Ambulatory Visit (INDEPENDENT_AMBULATORY_CARE_PROVIDER_SITE_OTHER): Payer: Medicare Other | Admitting: Internal Medicine

## 2020-05-16 ENCOUNTER — Encounter: Payer: Self-pay | Admitting: Internal Medicine

## 2020-05-16 ENCOUNTER — Other Ambulatory Visit: Payer: Self-pay

## 2020-05-16 VITALS — BP 126/78 | HR 67 | Temp 98.7°F | Resp 18 | Ht 63.0 in | Wt 202.2 lb

## 2020-05-16 DIAGNOSIS — N644 Mastodynia: Secondary | ICD-10-CM | POA: Diagnosis not present

## 2020-05-16 DIAGNOSIS — R8290 Unspecified abnormal findings in urine: Secondary | ICD-10-CM

## 2020-05-16 LAB — POCT URINALYSIS DIPSTICK
Bilirubin, UA: NEGATIVE
Blood, UA: NEGATIVE
Glucose, UA: NEGATIVE
Ketones, UA: NEGATIVE
Leukocytes, UA: NEGATIVE
Nitrite, UA: NEGATIVE
Protein, UA: NEGATIVE
Spec Grav, UA: 1.025 (ref 1.010–1.025)
Urobilinogen, UA: 0.2 E.U./dL
pH, UA: 6 (ref 5.0–8.0)

## 2020-05-16 NOTE — Assessment & Plan Note (Signed)
U/A done in office not consistent with infection.

## 2020-05-16 NOTE — Assessment & Plan Note (Signed)
Ordered right diagnostic mammogram and [place] to evaluate given prior left breast cancer. Suspect muscular strain.

## 2020-05-16 NOTE — Patient Instructions (Addendum)
We will get the mammogram.

## 2020-05-16 NOTE — Progress Notes (Signed)
   Subjective:   Patient ID: Jacqueline Berry, female    DOB: 1961-01-23, 60 y.o.   MRN: [id number]  HPI The patient is a 60 YO female coming in for right breast pain (she was lifting some things recently but does not recall injury or overuse, does have history of breast cancer 1990s left mastectomy so she is concerned, denies change in shape or nipple discharge or skin change, overall stable since onset, started about 1 week ago, has not tried anything for it), and urine odor (feels she could smell urine stronger today, denies burning with urination or abdominal pain, denies fevers or chills).   Review of Systems  Constitutional: Negative.   HENT: Negative.   Eyes: Negative.   Respiratory: Negative for cough, chest tightness and shortness of breath.   Cardiovascular: Negative for chest pain, palpitations and leg swelling.  Gastrointestinal: Negative for abdominal distention, abdominal pain, constipation, diarrhea, nausea and vomiting.  Genitourinary:       Right breast pain, urinary odor  Musculoskeletal: Negative.   Skin: Negative.   Neurological: Negative.   Psychiatric/Behavioral: Negative.     Objective:  Physical Exam Constitutional:      Appearance: She is well-developed and well-nourished.  HENT:     Head: Normocephalic and atraumatic.  Eyes:     Extraocular Movements: EOM normal.  Cardiovascular:     Rate and Rhythm: Normal rate and regular rhythm.     Comments: Right chest wall above the breast tender to touch  Pulmonary:     Effort: Pulmonary effort is normal. No respiratory distress.     Breath sounds: Normal breath sounds. No wheezing or rales.  Abdominal:     General: Bowel sounds are normal. There is no distension.     Palpations: Abdomen is soft.     Tenderness: There is no abdominal tenderness. There is no rebound.  Musculoskeletal:        General: No edema.     Cervical back: Normal range of motion.  Skin:    General: Skin is warm and dry.  Neurological:      Mental Status: She is alert and oriented to person, place, and time.     Coordination: Coordination normal.  Psychiatric:        Mood and Affect: Mood and affect normal.     Vitals:   05/16/20 1544  BP: 126/78  Pulse: 67  Resp: 18  Temp: 98.7 F (37.1 C)  TempSrc: Oral  SpO2: 99%  Weight: 202 lb 3.2 oz (91.7 kg)  Height: 5\' 3"  (1.6 m)    This visit occurred during the SARS-CoV-2 public health emergency.  Safety protocols were in place, including screening questions prior to the visit, additional usage of staff PPE, and extensive cleaning of exam room while observing appropriate contact time as indicated for disinfecting solutions.   Assessment & Plan:

## 2020-05-19 ENCOUNTER — Other Ambulatory Visit: Payer: Self-pay | Admitting: Internal Medicine

## 2020-05-19 DIAGNOSIS — N644 Mastodynia: Secondary | ICD-10-CM

## 2020-05-27 ENCOUNTER — Other Ambulatory Visit: Payer: Self-pay | Admitting: Internal Medicine

## 2020-05-27 DIAGNOSIS — E039 Hypothyroidism, unspecified: Secondary | ICD-10-CM

## 2020-05-29 ENCOUNTER — Telehealth: Payer: Self-pay | Admitting: Internal Medicine

## 2020-05-29 NOTE — Telephone Encounter (Signed)
LVM for pt to rtn my call to schedule AWV with [facility]. Please schedule appt if pt calls the office.  

## 2020-06-03 ENCOUNTER — Other Ambulatory Visit: Payer: Self-pay

## 2020-06-03 ENCOUNTER — Ambulatory Visit (INDEPENDENT_AMBULATORY_CARE_PROVIDER_SITE_OTHER): Payer: Medicare Other

## 2020-06-03 VITALS — BP 120/88 | HR 67 | Temp 98.2°F | Resp 16 | Ht 63.0 in | Wt 198.2 lb

## 2020-06-03 DIAGNOSIS — Z0000 Encounter for general adult medical examination without abnormal findings: Secondary | ICD-10-CM | POA: Diagnosis not present

## 2020-06-03 NOTE — Patient Instructions (Signed)
Jacqueline Berry , Thank you for taking time to come for your Medicare Wellness Visit. I appreciate your ongoing commitment to your health goals. Please review the following plan we discussed and let me know if I can assist you in the future.   Screening recommendations/referrals: Colonoscopy: 10/06/2017; due every 3 years (Cologuard) Mammogram: 06/15/2019 Bone Density: never done Recommended yearly ophthalmology/optometry visit for glaucoma screening and checkup Recommended yearly dental visit for hygiene and checkup  Vaccinations: Influenza vaccine: declined Pneumococcal vaccine: declined Tdap vaccine: 02/23/2018; due every 10 years (2029) Shingles vaccine: never done  Covid-19: 07/20/2019, 08/10/2019, 05/14/2020  Advanced directives: Advance directive discussed with you today. Even though you declined this today please call our office should you change your mind and we can give you the proper paperwork for you to fill out.  Conditions/risks identified: Yes; Reviewed health maintenance screenings with patient today and relevant education, vaccines, and/or referrals were provided. Please continue to do your personal lifestyle choices by: daily care of teeth and gums, regular physical activity (goal should be 5 days a week for 30 minutes), eat a healthy diet, avoid tobacco and drug use, limiting any alcohol intake, taking a low-dose aspirin (if not allergic or have been advised by your provider otherwise) and taking vitamins and minerals as recommended by your provider. Continue doing brain stimulating activities (puzzles, reading, adult coloring books, staying active) to keep memory sharp. Continue to eat heart healthy diet (full of fruits, vegetables, whole grains, lean protein, water--limit salt, fat, and sugar intake) and increase physical activity as tolerated.  Next appointment: Please schedule your next Medicare Wellness Visit with your Nurse Health Advisor in 1 year by calling  [phone].   Preventive Care 40-64 Years, Female Preventive care refers to lifestyle choices and visits with your health care provider that can promote health and wellness. What does preventive care include?  A yearly physical exam. This is also called an annual well check.  Dental exams once or twice a year.  Routine eye exams. Ask your health care provider how often you should have your eyes checked.  Personal lifestyle choices, including:  Daily care of your teeth and gums.  Regular physical activity.  Eating a healthy diet.  Avoiding tobacco and drug use.  Limiting alcohol use.  Practicing safe sex.  Taking low-dose aspirin daily starting at age 61.  Taking vitamin and mineral supplements as recommended by your health care provider. What happens during an annual well check? The services and screenings done by your health care provider during your annual well check will depend on your age, overall health, lifestyle risk factors, and family history of disease. Counseling  Your health care provider may ask you questions about your:  Alcohol use.  Tobacco use.  Drug use.  Emotional well-being.  Home and relationship well-being.  Sexual activity.  Eating habits.  Work and work Statistician.  Method of birth control.  Menstrual cycle.  Pregnancy history. Screening  You may have the following tests or measurements:  Height, weight, and BMI.  Blood pressure.  Lipid and cholesterol levels. These may be checked every 5 years, or more frequently if you are over 24 years old.  Skin check.  Lung cancer screening. You may have this screening every year starting at age 21 if you have a 30-pack-year history of smoking and currently smoke or have quit within the past 15 years.  Fecal occult blood test (FOBT) of the stool. You may have this test every year starting at age 71.  Flexible sigmoidoscopy or colonoscopy. You may have a sigmoidoscopy every 5 years  or a colonoscopy every 10 years starting at age 41.  Hepatitis C blood test.  Hepatitis B blood test.  Sexually transmitted disease (STD) testing.  Diabetes screening. This is done by checking your blood sugar (glucose) after you have not eaten for a while (fasting). You may have this done every 1-3 years.  Mammogram. This may be done every 1-2 years. Talk to your health care provider about when you should start having regular mammograms. This may depend on whether you have a family history of breast cancer.  BRCA-related cancer screening. This may be done if you have a family history of breast, ovarian, tubal, or peritoneal cancers.  Pelvic exam and Pap test. This may be done every 3 years starting at age 69. Starting at age 61, this may be done every 5 years if you have a Pap test in combination with an HPV test.  Bone density scan. This is done to screen for osteoporosis. You may have this scan if you are at high risk for osteoporosis. Discuss your test results, treatment options, and if necessary, the need for more tests with your health care provider. Vaccines  Your health care provider may recommend certain vaccines, such as:  Influenza vaccine. This is recommended every year.  Tetanus, diphtheria, and acellular pertussis (Tdap, Td) vaccine. You may need a Td booster every 10 years.  Zoster vaccine. You may need this after age 66.  Pneumococcal 13-valent conjugate (PCV13) vaccine. You may need this if you have certain conditions and were not previously vaccinated.  Pneumococcal polysaccharide (PPSV23) vaccine. You may need one or two doses if you smoke cigarettes or if you have certain conditions. Talk to your health care provider about which screenings and vaccines you need and how often you need them. This information is not intended to replace advice given to you by your health care provider. Make sure you discuss any questions you have with your health care  provider. Document Released: 05/02/2015 Document Revised: 12/24/2015 Document Reviewed: 02/04/2015 Elsevier Interactive Patient Education  2017 [place] Prevention in the Home Falls can cause injuries. They can happen to people of all ages. There are many things you can do to make your home safe and to help prevent falls. What can I do on the outside of my home?  Regularly fix the edges of walkways and driveways and fix any cracks.  Remove anything that might make you trip as you walk through a door, such as a raised step or threshold.  Trim any bushes or trees on the path to your home.  Use bright outdoor lighting.  Clear any walking paths of anything that might make someone trip, such as rocks or tools.  Regularly check to see if handrails are loose or broken. Make sure that both sides of any steps have handrails.  Any raised decks and porches should have guardrails on the edges.  Have any leaves, snow, or ice cleared regularly.  Use sand or salt on walking paths during winter.  Clean up any spills in your garage right away. This includes oil or grease spills. What can I do in the bathroom?  Use night lights.  Install grab bars by the toilet and in the tub and shower. Do not use towel bars as grab bars.  Use non-skid mats or decals in the tub or shower.  If you need to sit down in the shower,  use a plastic, non-slip stool.  Keep the floor dry. Clean up any water that spills on the floor as soon as it happens.  Remove soap buildup in the tub or shower regularly.  Attach bath mats securely with double-sided non-slip rug tape.  Do not have throw rugs and other things on the floor that can make you trip. What can I do in the bedroom?  Use night lights.  Make sure that you have a light by your bed that is easy to reach.  Do not use any sheets or blankets that are too big for your bed. They should not hang down onto the floor.  Have a firm chair that  has side arms. You can use this for support while you get dressed.  Do not have throw rugs and other things on the floor that can make you trip. What can I do in the kitchen?  Clean up any spills right away.  Avoid walking on wet floors.  Keep items that you use a lot in easy-to-reach places.  If you need to reach something above you, use a strong step stool that has a grab bar.  Keep electrical cords out of the way.  Do not use floor polish or wax that makes floors slippery. If you must use wax, use non-skid floor wax.  Do not have throw rugs and other things on the floor that can make you trip. What can I do with my stairs?  Do not leave any items on the stairs.  Make sure that there are handrails on both sides of the stairs and use them. Fix handrails that are broken or loose. Make sure that handrails are as long as the stairways.  Check any carpeting to make sure that it is firmly attached to the stairs. Fix any carpet that is loose or worn.  Avoid having throw rugs at the top or bottom of the stairs. If you do have throw rugs, attach them to the floor with carpet tape.  Make sure that you have a light switch at the top of the stairs and the bottom of the stairs. If you do not have them, ask someone to add them for you. What else can I do to help prevent falls?  Wear shoes that:  Do not have high heels.  Have rubber bottoms.  Are comfortable and fit you well.  Are closed at the toe. Do not wear sandals.  If you use a stepladder:  Make sure that it is fully opened. Do not climb a closed stepladder.  Make sure that both sides of the stepladder are locked into place.  Ask someone to hold it for you, if possible.  Clearly mark and make sure that you can see:  Any grab bars or handrails.  First and last steps.  Where the edge of each step is.  Use tools that help you move around (mobility aids) if they are needed. These  include:  Canes.  Walkers.  Scooters.  Crutches.  Turn on the lights when you go into a dark area. Replace any light bulbs as soon as they burn out.  Set up your furniture so you have a clear path. Avoid moving your furniture around.  If any of your floors are uneven, fix them.  If there are any pets around you, be aware of where they are.  Review your medicines with your doctor. Some medicines can make you feel dizzy. This can increase your chance of falling. Ask your doctor  what other things that you can do to help prevent falls. This information is not intended to replace advice given to you by your health care provider. Make sure you discuss any questions you have with your health care provider. Document Released: 01/30/2009 Document Revised: 09/11/2015 Document Reviewed: 05/10/2014 Elsevier Interactive Patient Education  2017 [employer].

## 2020-06-03 NOTE — Progress Notes (Signed)
Subjective:   Jacqueline Berry is a 59 y.o. female who presents for Medicare Annual (Subsequent) preventive examination.  Review of Systems    No ROS. Medicare Wellness Visit. Additional risk factors are reflected in social history. Cardiac Risk Factors include: advanced age (>76men, >9 women);dyslipidemia;family history of premature cardiovascular disease;hypertension;obesity (BMI >30kg/m2)     Objective:    Today's Vitals   06/03/20 1537  BP: 120/88  Pulse: 67  Resp: 16  Temp: 98.2 F (36.8 C)  SpO2: 98%  Weight: 198 lb 3.2 oz (89.9 kg)  Height: 5\' 3"  (1.6 m)  PainSc: 0-No pain   Body mass index is 35.11 kg/m.  Advanced Directives 06/03/2020 06/19/2019 03/03/2018 02/01/2017 03/18/2016 02/22/2016 05/29/2015  Does Patient Have a Medical Advance Directive? No No No No No No No  Would patient like information on creating a medical advance directive? No - Patient declined No - Patient declined No - Patient declined No - Patient declined - No - patient declined information Yes - Educational materials given    Current Medications (verified) Outpatient Encounter Medications as of 06/03/2020  Medication Sig  . amLODipine (NORVASC) 10 MG tablet TAKE 1 TABLET BY MOUTH EVERY MORNING  . benztropine (COGENTIN) 0.5 MG tablet Take 0.5 mg by mouth daily.   . haloperidol (HALDOL) 5 MG tablet Take 2.5 mg by mouth daily.   . haloperidol decanoate (HALDOL DECANOATE) 50 MG/ML injection Inject 100 mg into the muscle every 28 (twenty-eight) days.  Marland Kitchen levothyroxine (SYNTHROID) 100 MCG tablet Take 1 tablet (100 mcg total) by mouth daily.  Marland Kitchen lisinopril-hydrochlorothiazide (ZESTORETIC) 20-12.5 MG tablet TAKE 1 TABLET BY MOUTH EVERY DAY  . Multiple Vitamins-Minerals (MULTIVITAMIN WITH MINERALS) tablet Take 1 tablet by mouth daily.   No facility-administered encounter medications on file as of 06/03/2020.    Allergies (verified) Latex   History: Past Medical History:  Diagnosis Date  . Bipolar  disorder ([place])   . Breast cancer ([place])    with chemo - 1997  . Hypertension   . Syphilis   . Thyroid disease   . Thyroid disease    Past Surgical History:  Procedure Laterality Date  . BREAST BIOPSY  1997  . MASTECTOMY     left breat mastectomy 1997  . TUBAL LIGATION     1992   Family History  Problem Relation Age of Onset  . Stroke Father   . Diabetes Brother   . Coronary artery disease Brother   . Cancer Mother        ? type  . Breast cancer Sister        57's   Social History   Socioeconomic History  . Marital status: Married    Spouse name: Not on file  . Number of children: Not on file  . Years of education: Not on file  . Highest education level: Not on file  Occupational History  . Not on file  Tobacco Use  . Smoking status: Former Smoker    Packs/day: 0.50    Years: 37.00    Pack years: 18.50    Types: Cigarettes  . Smokeless tobacco: Never Used  Vaping Use  . Vaping Use: Former  Substance and Sexual Activity  . Alcohol use: No    Comment: Pt denies  . Drug use: No    Comment: Pt denies   . Sexual activity: Yes    Birth control/protection: Surgical    Comment: 1st intercourse 59 yo-More than 5 partners  Other Topics Concern  .  Not on file  Social History Narrative  . Not on file   Social Determinants of Health   Financial Resource Strain: Low Risk   . Difficulty of Paying Living Expenses: Not hard at all  Food Insecurity: No Food Insecurity  . Worried About Charity fundraiser in the Last Year: Never true  . Ran Out of Food in the Last Year: Never true  Transportation Needs: No Transportation Needs  . Lack of Transportation (Medical): No  . Lack of Transportation (Non-Medical): No  Physical Activity: Inactive  . Days of Exercise per Week: 0 days  . Minutes of Exercise per Session: 0 min  Stress: Stress Concern Present  . Feeling of Stress : Very much  Social Connections: Moderately Isolated  . Frequency of Communication with Friends  and Family: More than three times a week  . Frequency of Social Gatherings with Friends and Family: Once a week  . Attends Religious Services: Never  . Active Member of Clubs or Organizations: No  . Attends Archivist Meetings: Never  . Marital Status: Married    Tobacco Counseling Counseling given: Not Answered   Clinical Intake:  Pre-visit preparation completed: Yes  Pain : No/denies pain Pain Score: 0-No pain     BMI - recorded: 35.11 Nutritional Status: BMI > 30  Obese Nutritional Risks: Other (Comment) (poor appetite) Diabetes: No  How often do you need to have someone help you when you read instructions, pamphlets, or other written materials from your doctor or pharmacy?: 1 - Never What is the last grade level you completed in school?: HSG; 1 year of college courses  Diabetic? no  Interpreter Needed?: No  Information entered by :: Lisette Abu, LPN   Activities of Daily Living In your present state of health, do you have any difficulty performing the following activities: 06/03/2020  Hearing? N  Vision? N  Difficulty concentrating or making decisions? Y  Walking or climbing stairs? N  Dressing or bathing? N  Doing errands, shopping? N  Preparing Food and eating ? N  Using the Toilet? N  In the past six months, have you accidently leaked urine? N  Do you have problems with loss of bowel control? N  Managing your Medications? N  Managing your Finances? N  Housekeeping or managing your Housekeeping? N  Some recent data might be hidden    Patient Care Team: Hoyt Koch, MD as PCP - General (Internal Medicine) Rogers Blocker, MD (Internal Medicine) Charlton Haws, [facility] as Pharmacist (Pharmacist)  Indicate any recent Medical Services you may have received from other than [facility] providers in the past year (date may be approximate).     Assessment:   This is a routine wellness examination for Jacqueline Berry.  Hearing/Vision screen No exam  data present  Dietary issues and exercise activities discussed: Current Exercise Habits: The patient has a physically strenuous job, but has no regular exercise apart from work., Exercise limited by: psychological condition(s)  Goals    .  patient (pt-stated)      Wants to wake up and get up earlier. Will set the alarm for 10am; try for 7 days     .  Pharmacy Care Plan      CARE PLAN ENTRY  Current Barriers:  . Chronic Disease Management support, education, and care coordination needs related to Hypertension and Health maintenance   Hypertension BP Readings from Last 3 Encounters:  07/10/19 132/80  06/19/19 (!) 128/92  05/24/19 136/86 .  Pharmacist Clinical Goal(s): o Over the next 120 days, patient will work with PharmD and providers to achieve BP goal <130/80 . Current regimen:  o amlodipine 10 mg daily,  o lisinopril 40 mg daily . Interventions: o Discussed BP goal and benefits of medications for prevention of heart attack /stroke . Patient self care activities - Over the next 120 days, patient will: o Check BP 2-3 times weekly, document, and provide at future appointments o Ensure daily salt intake < 2300 mg/day  Bone Health . Pharmacist Clinical Goal(s) o Over the next 120 days, patient will work with PharmD and providers to optimize calcium and Vitamin D . Current regimen:  o Multivitamin . Interventions: o Discussed benefits of calcium and Vitamin D for bone strength and to prevent fractures o Recommend calcium 500 mg-Vitamin D 1000 IU daily . Patient self care activities - Over the next 120 days, patient will: o Take calcium and vitamin D supplement as above  Pain/Stiffness . Pharmacist Clinical Goal(s) o Over the next 120 days, patient will work with PharmD and providers to optimize therapy . Current regimen:  o No medications . Interventions: o Discussed benefits of daily exercise and movement . Patient self care activities - Over the next 120 days, patient  will: o Partake in at least 20 minutes of physical activity daily  Medication management . Pharmacist Clinical Goal(s): o Over the next 120 days, patient will work with PharmD and providers to achieve optimal medication adherence . Current pharmacy: [facility]  . Interventions o Comprehensive medication review performed. o Utilize [facility] pharmacy for medication synchronization, packaging and delivery . Patient self care activities - Over the next 120 days, patient will: o Focus on medication adherence by pill pack o Take medications as prescribed o Report any questions or concerns to PharmD and/or provider(s)  Please see past updates related to this goal by clicking on the "Past Updates" button in the selected goal       Depression Screen PHQ 2/9 Scores 06/03/2020 03/03/2018 09/29/2017 09/28/2016 05/29/2015  PHQ - 2 Score 2 0 0 0 0    Fall Risk Fall Risk  06/03/2020 03/03/2018 09/29/2017 09/28/2016 05/29/2015  Falls in the past year? 0 1 No No No  Number falls in past yr: 0 0 - - -  Injury with Fall? 0 1 - - -  Risk for fall due to : No Fall Risks - - - -    FALL RISK PREVENTION PERTAINING TO THE HOME:  Any stairs in or around the home? No  If so, are there any without handrails? No  Home free of loose throw rugs in walkways, pet beds, electrical cords, etc? Yes  Adequate lighting in your home to reduce risk of falls? Yes   ASSISTIVE DEVICES UTILIZED TO PREVENT FALLS:  Life alert? No  Use of a cane, walker or w/c? No  Grab bars in the bathroom? Yes  Shower chair or bench in shower? No  Elevated toilet seat or a handicapped toilet? No   TIMED UP AND GO:  Was the test performed? No .  Length of time to ambulate 10 feet: 0 sec.   Gait steady and fast without use of assistive device  Cognitive Function: Normal cognitive status assessed by direct observation by this Nurse Health Advisor. No abnormalities found.          Immunizations Immunization History  Administered  Date(s) Administered  . PFIZER Comirnaty(Gray Top)Covid-19 Tri-Sucrose Vaccine 05/14/2020  . PFIZER(Purple Top)SARS-COV-2 Vaccination 07/20/2019, 08/10/2019  .  Td 04/20/2007  . Tdap 02/23/2018    TDAP status: Up to date  Flu Vaccine status: Declined, Education has been provided regarding the importance of this vaccine but patient still declined. Advised may receive this vaccine at local pharmacy or Health Dept. Aware to provide a copy of the vaccination record if obtained from local pharmacy or Health Dept. Verbalized acceptance and understanding.  Pneumococcal vaccine status: Declined,  Education has been provided regarding the importance of this vaccine but patient still declined. Advised may receive this vaccine at local pharmacy or Health Dept. Aware to provide a copy of the vaccination record if obtained from local pharmacy or Health Dept. Verbalized acceptance and understanding.   Covid-19 vaccine status: Completed vaccines  Qualifies for Shingles Vaccine? Yes   Zostavax completed No   Shingrix Completed?: No.    Education has been provided regarding the importance of this vaccine. Patient has been advised to call insurance company to determine out of pocket expense if they have not yet received this vaccine. Advised may also receive vaccine at local pharmacy or Health Dept. Verbalized acceptance and understanding.  Screening Tests Health Maintenance  Topic Date Due  . Hepatitis C Screening  Never done  . HIV Screening  Never done  . INFLUENZA VACCINE  07/17/2020 (Originally 11/18/2019)  . Fecal DNA (Cologuard)  10/06/2020  . PAP SMEAR-Modifier  03/30/2021  . MAMMOGRAM  06/14/2021  . TETANUS/TDAP  02/24/2028  . COVID-19 Vaccine  Completed    Health Maintenance  Health Maintenance Due  Topic Date Due  . Hepatitis C Screening  Never done  . HIV Screening  Never done    Colorectal cancer screening: Type of screening: Cologuard. Completed 10/06/2017. Repeat every 3  years  Mammogram status: Completed 06/15/2019. Repeat every year  Bone Density status: never done  Lung Cancer Screening: (Low Dose CT Chest recommended if Age 63-80 years, 30 pack-year currently smoking OR have quit w/in 15years.) does qualify.   Lung Cancer Screening Referral: no  Additional Screening:  Hepatitis C Screening: does qualify; Completed no  Vision Screening: Recommended annual ophthalmology exams for early detection of glaucoma and other disorders of the eye. Is the patient up to date with their annual eye exam?  Yes  Who is the provider or what is the name of the office in which the patient attends annual eye exams? [facility] If pt is not established with a provider, would they like to be referred to a provider to establish care? No .   Dental Screening: Recommended annual dental exams for proper oral hygiene  Community Resource Referral / Chronic Care Management: CRR required this visit?  No   CCM required this visit?  No      Plan:     I have personally reviewed and noted the following in the patient's chart:   . Medical and social history . Use of alcohol, tobacco or illicit drugs  . Current medications and supplements . Functional ability and status . Nutritional status . Physical activity . Advanced directives . List of other physicians . Hospitalizations, surgeries, and ER visits in previous 12 months . Vitals . Screenings to include cognitive, depression, and falls . Referrals and appointments  In addition, I have reviewed and discussed with patient certain preventive protocols, quality metrics, and best practice recommendations. A written personalized care plan for preventive services as well as general preventive health recommendations were provided to patient.     Sheral Flow, LPN   08/15/7679  Nurse Notes: n/a

## 2020-06-04 ENCOUNTER — Other Ambulatory Visit: Payer: Self-pay | Admitting: Internal Medicine

## 2020-06-04 DIAGNOSIS — E039 Hypothyroidism, unspecified: Secondary | ICD-10-CM

## 2020-07-02 ENCOUNTER — Ambulatory Visit
Admission: RE | Admit: 2020-07-02 | Discharge: 2020-07-02 | Disposition: A | Discharge status: Home / Self Care | Payer: Medicare Other | Source: Ambulatory Visit | Attending: Internal Medicine | Admitting: Internal Medicine

## 2020-07-02 DIAGNOSIS — R922 Inconclusive mammogram: Secondary | ICD-10-CM | POA: Diagnosis not present

## 2020-07-02 DIAGNOSIS — N644 Mastodynia: Secondary | ICD-10-CM

## 2020-07-02 DIAGNOSIS — R928 Other abnormal and inconclusive findings on diagnostic imaging of breast: Secondary | ICD-10-CM | POA: Diagnosis not present

## 2020-07-02 DIAGNOSIS — Z853 Personal history of malignant neoplasm of breast: Secondary | ICD-10-CM | POA: Diagnosis not present

## 2020-09-02 ENCOUNTER — Other Ambulatory Visit: Payer: Self-pay | Admitting: Internal Medicine

## 2020-09-02 DIAGNOSIS — E039 Hypothyroidism, unspecified: Secondary | ICD-10-CM

## 2020-09-10 DIAGNOSIS — I87393 Chronic venous hypertension (idiopathic) with other complications of bilateral lower extremity: Secondary | ICD-10-CM | POA: Diagnosis not present

## 2020-09-10 DIAGNOSIS — M79605 Pain in left leg: Secondary | ICD-10-CM | POA: Diagnosis not present

## 2020-09-10 DIAGNOSIS — M79652 Pain in left thigh: Secondary | ICD-10-CM | POA: Diagnosis not present

## 2020-10-23 ENCOUNTER — Emergency Department (HOSPITAL_COMMUNITY): Payer: Medicare Other

## 2020-10-23 ENCOUNTER — Other Ambulatory Visit: Payer: Self-pay

## 2020-10-23 ENCOUNTER — Emergency Department (HOSPITAL_COMMUNITY)
Admission: EM | Admit: 2020-10-23 | Discharge: 2020-10-23 | Disposition: A | Discharge status: Home / Self Care | Payer: Medicare Other | Attending: Emergency Medicine | Admitting: Emergency Medicine

## 2020-10-23 ENCOUNTER — Encounter (HOSPITAL_COMMUNITY): Payer: Self-pay

## 2020-10-23 DIAGNOSIS — Z87891 Personal history of nicotine dependence: Secondary | ICD-10-CM | POA: Insufficient documentation

## 2020-10-23 DIAGNOSIS — Z853 Personal history of malignant neoplasm of breast: Secondary | ICD-10-CM | POA: Insufficient documentation

## 2020-10-23 DIAGNOSIS — E039 Hypothyroidism, unspecified: Secondary | ICD-10-CM | POA: Diagnosis not present

## 2020-10-23 DIAGNOSIS — I10 Essential (primary) hypertension: Secondary | ICD-10-CM | POA: Insufficient documentation

## 2020-10-23 DIAGNOSIS — M25562 Pain in left knee: Secondary | ICD-10-CM | POA: Diagnosis not present

## 2020-10-23 DIAGNOSIS — Z79899 Other long term (current) drug therapy: Secondary | ICD-10-CM | POA: Insufficient documentation

## 2020-10-23 DIAGNOSIS — Z91040 Latex allergy status: Secondary | ICD-10-CM | POA: Diagnosis not present

## 2020-10-23 NOTE — ED Provider Notes (Signed)
[facility] DEPT Provider Note   CSN: [id number] Arrival date & time: 10/23/20  0809     History Chief Complaint  Patient presents with   Knee Pain    Jacqueline Berry is a 60 y.o. female.  60 year old female with prior medical history as detailed below presents for evaluation.  Patient complains of diffuse left knee pain.  Patient reports that onset of pain was approximately 1 month ago.  Patient denies any specific injury.  Patient reports the pain is worse with prolonged standing or significant walking or running.  She has used arthritis cream applied over the knee with some improvement.  She is taking a couple doses of Advil with some degree of improvement as well.  She denies associated fever, significant swelling, redness, or other complaint.  The history is provided by the patient and medical records.  Knee Pain Location:  Knee Time since incident:  3 weeks Injury: no   Knee location:  L knee Pain details:    Quality:  Aching   Radiates to:  Does not radiate   Severity:  Mild   Onset quality:  Gradual   Duration:  3 weeks   Timing:  Intermittent   Progression:  Waxing and waning Chronicity:  New Dislocation: no       Past Medical History:  Diagnosis Date   Bipolar disorder ([place])    Breast cancer ([place])    with chemo - 1997   Hypertension    Syphilis    Thyroid disease    Thyroid disease     Patient Active Problem List   Diagnosis Date Noted   Pain of right breast 05/16/2020   Hyperlipidemia LDL goal <130 12/31/2019   Abnormal urine odor 11/24/2018   Hypokalemia 11/16/2017   Urinary incontinence 09/30/2017   Routine general medical examination at a health care facility 09/30/2017   Paresthesia of both feet 10/17/2014   Obesity 10/16/2009   Hypothyroidism 07/11/2009   Paranoid schizophrenia in remission ([place]) 06/24/2009   TOBACCO ABUSE 06/24/2009   Essential hypertension 06/24/2009   HX, PERSONAL, MALIGNANCY, BREAST  12/07/2006    Past Surgical History:  Procedure Laterality Date   BREAST BIOPSY  1997   MASTECTOMY     left breat mastectomy 1997   TUBAL LIGATION     1992     OB History     Gravida  5   Para  3   Term  3   Preterm      AB  2   Living  3      SAB      IAB  2   Ectopic      Multiple      Live Births              Family History  Problem Relation Age of Onset   Stroke Father    Diabetes Brother    Coronary artery disease Brother    Cancer Mother        ? type   Breast cancer Sister        61's    Social History   Tobacco Use   Smoking status: Former    Packs/day: 0.50    Years: 37.00    Pack years: 18.50    Types: Cigarettes   Smokeless tobacco: Never  Vaping Use   Vaping Use: Former  Substance Use Topics   Alcohol use: No    Comment: Pt denies   Drug use: No  Comment: Pt denies     Home Medications Prior to Admission medications   Medication Sig Start Date End Date Taking? Authorizing Provider  amLODipine (NORVASC) 10 MG tablet TAKE 1 TABLET BY MOUTH EVERY MORNING 04/29/20   Hoyt Koch, MD  benztropine (COGENTIN) 0.5 MG tablet Take 0.5 mg by mouth daily.     [provider]  haloperidol (HALDOL) 5 MG tablet Take 2.5 mg by mouth daily.     [provider]  haloperidol decanoate (HALDOL DECANOATE) 50 MG/ML injection Inject 100 mg into the muscle every 28 (twenty-eight) days.    [provider]  levothyroxine (SYNTHROID) 100 MCG tablet TAKE 1 TABLET BY MOUTH EVERY DAY 09/02/20   Hoyt Koch, MD  lisinopril-hydrochlorothiazide (ZESTORETIC) 20-12.5 MG tablet TAKE 1 TABLET BY MOUTH EVERY DAY 11/01/19   Hoyt Koch, MD  Multiple Vitamins-Minerals (MULTIVITAMIN WITH MINERALS) tablet Take 1 tablet by mouth daily.    [provider]    Allergies    Latex  Review of Systems   Review of Systems  All other systems reviewed and are negative.  Physical Exam Updated Vital  Signs BP (!) 148/89 (BP Location: Left Arm)   Pulse 91   Temp 98.4 F (36.9 C) (Oral)   Resp 16   Ht 5\' 3"  (1.6 m)   Wt 96.2 kg   LMP 02/18/2011   SpO2 99%   BMI 37.55 kg/m   Physical Exam Vitals and nursing note reviewed.  Constitutional:      General: She is not in acute distress.    Appearance: Normal appearance. She is well-developed.  HENT:     Head: Normocephalic and atraumatic.  Eyes:     Conjunctiva/sclera: Conjunctivae normal.     Pupils: Pupils are equal, round, and reactive to light.  Cardiovascular:     Rate and Rhythm: Normal rate and regular rhythm.     Heart sounds: Normal heart sounds.  Pulmonary:     Effort: Pulmonary effort is normal. No respiratory distress.     Breath sounds: Normal breath sounds.  Abdominal:     General: There is no distension.     Palpations: Abdomen is soft.     Tenderness: There is no abdominal tenderness.  Musculoskeletal:        General: No swelling, deformity or signs of injury. Normal range of motion.     Cervical back: Normal range of motion and neck supple.     Comments: L Knee -no appreciable erythema, edema, joint effusion - minimal diffuse tenderness with palpation - distal LLE is NVI  Skin:    General: Skin is warm and dry.  Neurological:     General: No focal deficit present.     Mental Status: She is alert and oriented to person, place, and time.    ED Results / Procedures / Treatments   Labs (all labs ordered are listed, but only abnormal results are displayed) Labs Reviewed - No data to display  EKG None  Radiology DG Knee Complete 4 Views Left  Result Date: 10/23/2020 CLINICAL DATA:  LEFT knee pain for awhile, fell, cannot remember how long ago EXAM: LEFT KNEE - COMPLETE 4+ VIEW COMPARISON:  None FINDINGS: Osseous mineralization normal. Joint spaces preserved. Tiny patellar spurs. No acute fracture, dislocation, or bone destruction. No joint effusion. IMPRESSION: Mild patellofemoral degenerative changes. No  acute abnormalities. Electronically Signed   By: Lavonia Dana M.D.   On: 10/23/2020 09:01    Procedures Procedures   Medications  Ordered in ED Medications - No data to display  ED Course  I have reviewed the triage vital signs and the nursing notes.  Pertinent labs & imaging results that were available during my care of the patient were reviewed by me and considered in my medical decision making (see chart for details).    MDM Rules/Calculators/A&P                          MDM  MSE complete  Jacqueline Berry was evaluated in Emergency Department on 10/23/2020 for the symptoms described in the history of present illness. She was evaluated in the context of the global COVID-19 pandemic, which necessitated consideration that the patient might be at risk for infection with the SARS-CoV-2 virus that causes COVID-19. Institutional protocols and algorithms that pertain to the evaluation of patients at risk for COVID-19 are in a state of rapid change based on information released by regulatory bodies including the CDC and federal and state organizations. These policies and algorithms were followed during the patient's care in the ED.   Patient is presenting with complaint of left-sided knee pain.  Patient's reported pain has been ongoing for the last month.  Described symptoms are most consistent with discomfort secondary to likely early arthritic changes of the joint.  Films obtained today are without evidence of acute fracture.  Patient does understand need for close follow-up.  Strict return precautions given understood.   Final Clinical Impression(s) / ED Diagnoses Final diagnoses:  Left knee pain, unspecified chronicity    Rx / DC Orders ED Discharge Orders     None        Valarie Merino, MD 10/23/20 [phone]

## 2020-10-23 NOTE — ED Triage Notes (Signed)
Patient c/o left knee pain x 3 weeks. Patient states no injury or swelling.

## 2020-10-23 NOTE — Discharge Instructions (Addendum)
Return for any problem.   Your Xray today did not show any fracture/broken bone.  Your pain today may be secondary to arthritis.  Closely follow-up with your regular care provider as instructed.  You may also follow-up with Dr. Erlinda Hong of [facility] as instructed.   Use Tylenol as instructed for pain control.

## 2020-10-26 ENCOUNTER — Other Ambulatory Visit: Payer: Self-pay | Admitting: Internal Medicine

## 2020-11-01 ENCOUNTER — Other Ambulatory Visit: Payer: Self-pay | Admitting: Internal Medicine

## 2020-11-05 DIAGNOSIS — M25561 Pain in right knee: Secondary | ICD-10-CM | POA: Diagnosis not present

## 2020-11-05 DIAGNOSIS — M1712 Unilateral primary osteoarthritis, left knee: Secondary | ICD-10-CM | POA: Diagnosis not present

## 2020-11-05 DIAGNOSIS — M25562 Pain in left knee: Secondary | ICD-10-CM | POA: Diagnosis not present

## 2020-11-11 ENCOUNTER — Ambulatory Visit: Payer: Medicare Other | Admitting: Internal Medicine

## 2020-11-11 ENCOUNTER — Other Ambulatory Visit: Payer: Self-pay

## 2020-11-12 ENCOUNTER — Other Ambulatory Visit: Payer: Self-pay | Admitting: Internal Medicine

## 2020-11-13 NOTE — Telephone Encounter (Signed)
Patient calling for status of Lisinopril She has not had medication in 2 weeks

## 2020-11-13 NOTE — Telephone Encounter (Signed)
Patient calling for status of refill  

## 2020-11-14 ENCOUNTER — Other Ambulatory Visit: Payer: Self-pay

## 2020-11-14 MED ORDER — LISINOPRIL-HYDROCHLOROTHIAZIDE 20-12.5 MG PO TABS: 1.0000 | ORAL_TABLET | Freq: Every day | ORAL | 0 refills | Status: DC

## 2020-11-18 ENCOUNTER — Other Ambulatory Visit: Payer: Self-pay

## 2020-11-18 ENCOUNTER — Ambulatory Visit (INDEPENDENT_AMBULATORY_CARE_PROVIDER_SITE_OTHER): Payer: Medicare Other | Admitting: Orthopaedic Surgery

## 2020-11-18 ENCOUNTER — Encounter: Payer: Self-pay | Admitting: Orthopaedic Surgery

## 2020-11-18 DIAGNOSIS — M1712 Unilateral primary osteoarthritis, left knee: Secondary | ICD-10-CM | POA: Diagnosis not present

## 2020-11-18 MED ORDER — BUPIVACAINE HCL 0.5 % IJ SOLN
2.0000 mL | INTRAMUSCULAR | Status: AC | PRN
  Administered 2020-11-18: 2 mL via INTRA_ARTICULAR

## 2020-11-18 MED ORDER — LIDOCAINE HCL 1 % IJ SOLN
2.0000 mL | INTRAMUSCULAR | Status: AC | PRN
  Administered 2020-11-18: 2 mL

## 2020-11-18 MED ORDER — METHYLPREDNISOLONE ACETATE 40 MG/ML IJ SUSP
40.0000 mg | INTRAMUSCULAR | Status: AC | PRN
  Administered 2020-11-18: 40 mg via INTRA_ARTICULAR

## 2020-11-18 NOTE — Progress Notes (Signed)
Office Visit Note   Patient: Jacqueline Berry           Date of Birth: April 29, 1960           MRN: [id number] Visit Date: 11/18/2020              Requested by: Hoyt Koch, MD [address] [place],  [place] [address] PCP: Hoyt Koch, MD   Assessment & Plan: Visit Diagnoses:  1. Primary osteoarthritis of left knee     Plan: Based on findings impression is left knee osteoarthritis.  Treatment options discussed today and she elected for cortisone injection which she tolerated well.  She will also purchase Voltaren gel and continue to use the knee brace.  We will see her back as needed.  Follow-Up Instructions: Return if symptoms worsen or fail to improve.   Orders:  No orders of the defined types were placed in this encounter.  No orders of the defined types were placed in this encounter.     Procedures: Large Joint Inj: L knee on 11/18/2020 3:18 PM Details: 22 G needle Medications: 2 mL bupivacaine 0.5 %; 2 mL lidocaine 1 %; 40 mg methylPREDNISolone acetate 40 MG/ML Outcome: tolerated well, no immediate complications Patient was prepped and draped in the usual sterile fashion.      Clinical Data: No additional findings.   Subjective: Chief Complaint  Patient presents with   Left Knee - New Patient (Initial Visit)    HPI Jacqueline Berry is a pleasant 60 year old female comes in for evaluation of chronic left knee pain for about 3 to 4 months.  Denies any injuries or trauma.  Went to the ER on 10/23/2020 for evaluation.  Denies history of gout.  Has an over-the-counter knee brace that she uses for activity.  She has been using over-the-counter medications for about a month.  She notices aching throughout the knee that is worse with laying in bed and with bending the knee.  Denies any mechanical symptoms.  Review of Systems  Constitutional: Negative.   HENT: Negative.    Eyes: Negative.   Respiratory: Negative.    Cardiovascular: Negative.   Endocrine:  Negative.   Musculoskeletal: Negative.   Neurological: Negative.   Hematological: Negative.   Psychiatric/Behavioral: Negative.    All other systems reviewed and are negative.   Objective: Vital Signs: LMP 02/18/2011   Physical Exam Vitals and nursing note reviewed.  Constitutional:      Appearance: She is well-developed.  HENT:     Head: Normocephalic and atraumatic.     Nose: Nose normal.  Eyes:     Extraocular Movements: Extraocular movements intact.  Cardiovascular:     Pulses: Normal pulses.  Pulmonary:     Effort: Pulmonary effort is normal.  Abdominal:     Palpations: Abdomen is soft.  Musculoskeletal:     Cervical back: Neck supple.  Skin:    General: Skin is warm.     Capillary Refill: Capillary refill takes less than 2 seconds.  Neurological:     Mental Status: She is alert and oriented to person, place, and time. Mental status is at baseline.  Psychiatric:        Behavior: Behavior normal.        Thought Content: Thought content normal.        Judgment: Judgment normal.    Ortho Exam Left knee shows no joint effusion.  Good range of motion without patellofemoral crepitus.  Collaterals and cruciates are stable.  No  joint line tenderness.  Specialty Comments:  No specialty comments available.  Imaging: No results found.   PMFS History: Patient Active Problem List   Diagnosis Date Noted   Pain of right breast 05/16/2020   Hyperlipidemia LDL goal <130 12/31/2019   Abnormal urine odor 11/24/2018   Hypokalemia 11/16/2017   Urinary incontinence 09/30/2017   Routine general medical examination at a health care facility 09/30/2017   Paresthesia of both feet 10/17/2014   Obesity 10/16/2009   Hypothyroidism 07/11/2009   Paranoid schizophrenia in remission ([place]) 06/24/2009   TOBACCO ABUSE 06/24/2009   Essential hypertension 06/24/2009   HX, PERSONAL, MALIGNANCY, BREAST 12/07/2006   Past Medical History:  Diagnosis Date   Bipolar disorder ([place])     Breast cancer ([place])    with chemo - 1997   Hypertension    Syphilis    Thyroid disease    Thyroid disease     Family History  Problem Relation Age of Onset   Stroke Father    Diabetes Brother    Coronary artery disease Brother    Cancer Mother        ? type   Breast cancer Sister        69's    Past Surgical History:  Procedure Laterality Date   BREAST BIOPSY  1997   MASTECTOMY     left breat mastectomy [place]   Social History   Occupational History   Not on file  Tobacco Use   Smoking status: Former    Packs/day: 0.50    Years: 37.00    Pack years: 18.50    Types: Cigarettes   Smokeless tobacco: Never  Vaping Use   Vaping Use: Former  Substance and Sexual Activity   Alcohol use: No    Comment: Pt denies   Drug use: No    Comment: Pt denies    Sexual activity: Yes    Birth control/protection: Surgical    Comment: 1st intercourse 60 yo-More than 5 partners

## 2020-11-20 ENCOUNTER — Telehealth: Payer: Self-pay | Admitting: Orthopaedic Surgery

## 2020-11-20 NOTE — Telephone Encounter (Signed)
Try wearing a compression knee brace for support

## 2020-11-20 NOTE — Telephone Encounter (Signed)
Patient called advised her right knee gave out on her this morning and she almost fell. Patient said she is at work right now and want to know what she should do?   The number to contact patient is [phone]

## 2020-11-20 NOTE — Telephone Encounter (Signed)
Patient aware. If not any better she will call us back.

## 2020-11-24 ENCOUNTER — Telehealth: Payer: Self-pay | Admitting: Orthopaedic Surgery

## 2020-11-24 ENCOUNTER — Other Ambulatory Visit: Payer: Self-pay

## 2020-11-24 DIAGNOSIS — M1712 Unilateral primary osteoarthritis, left knee: Secondary | ICD-10-CM

## 2020-11-24 NOTE — Telephone Encounter (Signed)
Pt stated she is still not feeling any better whatsoever and wanted to know if there was anything else she could do to help or if she needs to make another appt with Dr. Erlinda Hong to be reseen, last visit was 11/18/20. The best call back number is [phone].

## 2020-11-24 NOTE — Telephone Encounter (Signed)
MRI order made. They will call her once approved by ins.

## 2020-11-24 NOTE — Telephone Encounter (Signed)
Pt called requesting a call back. Pt states she received an injection in left knee and she can barely walk and lots of pain. Please call pt at [phone].

## 2020-11-24 NOTE — Telephone Encounter (Signed)
Patient aware.

## 2020-11-24 NOTE — Telephone Encounter (Signed)
Pt calling again to check the status of the previous message. The best call back number is [phone].

## 2020-11-24 NOTE — Telephone Encounter (Signed)
Please order MRI of the left knee to rule out fracture.  Thanks.

## 2020-11-30 ENCOUNTER — Other Ambulatory Visit: Payer: Self-pay | Admitting: Internal Medicine

## 2020-11-30 DIAGNOSIS — E039 Hypothyroidism, unspecified: Secondary | ICD-10-CM

## 2020-12-09 ENCOUNTER — Other Ambulatory Visit: Payer: Self-pay

## 2020-12-09 ENCOUNTER — Telehealth: Payer: Self-pay | Admitting: Pharmacist

## 2020-12-09 ENCOUNTER — Telehealth: Payer: Medicare Other

## 2020-12-09 ENCOUNTER — Ambulatory Visit
Admission: RE | Admit: 2020-12-09 | Discharge: 2020-12-09 | Disposition: A | Discharge status: Home / Self Care | Payer: Medicare Other | Source: Ambulatory Visit | Attending: Orthopaedic Surgery | Admitting: Orthopaedic Surgery

## 2020-12-09 DIAGNOSIS — S83412A Sprain of medial collateral ligament of left knee, initial encounter: Secondary | ICD-10-CM | POA: Diagnosis not present

## 2020-12-09 DIAGNOSIS — M1712 Unilateral primary osteoarthritis, left knee: Secondary | ICD-10-CM

## 2020-12-09 DIAGNOSIS — S83242A Other tear of medial meniscus, current injury, left knee, initial encounter: Secondary | ICD-10-CM | POA: Diagnosis not present

## 2020-12-09 DIAGNOSIS — M25462 Effusion, left knee: Secondary | ICD-10-CM | POA: Diagnosis not present

## 2020-12-09 DIAGNOSIS — M7122 Synovial cyst of popliteal space [Baker], left knee: Secondary | ICD-10-CM | POA: Diagnosis not present

## 2020-12-09 NOTE — Telephone Encounter (Signed)
  Chronic Care Management   Outreach Note  [id number] Name: Jacqueline Berry MRN: [id number] DOB: Sep 10, 1960  Referred by: Hoyt Koch, MD  Patient had a phone appointment scheduled with clinical pharmacist today.  An unsuccessful telephone outreach was attempted today. The patient was referred to the pharmacist for assistance with medications, care management and care coordination.   Patient will NOT be penalized in any way for missing a CCM appointment. The no-show fee does not apply.  If possible, a message was left to return call to: [phone] or to [facility]: [place], PharmD, Para [place], CPP Clinical Pharmacist [facility] [phone]

## 2020-12-09 NOTE — Progress Notes (Deleted)
Chronic Care Management Pharmacy Note  4/74/2595 Name:  JAMISE PENTLAND MRN:  [id number] DOB:  11/11/1960  Summary: ***  Recommendations/Changes made from today's visit: ***  Plan: ***   Subjective: CHENOAH MCNALLY is an 60 y.o. year old female who is a primary patient of Hoyt Koch, MD.  The CCM team was consulted for assistance with disease management and care coordination needs.    Engaged with patient by telephone for follow up visit in response to provider referral for pharmacy case management and/or care coordination services.   Consent to Services:  The patient was given information about Chronic Care Management services, agreed to services, and gave verbal consent prior to initiation of services.  Please see initial visit note for detailed documentation.   Patient Care Team: Hoyt Koch, MD as PCP - General (Internal Medicine) Rogers Blocker, MD (Internal Medicine) Charlton Haws, [facility] as Pharmacist (Pharmacist)  Recent office visits: 05/16/20 Dr Sharlet Salina OV: c/o breast pain and cystitis. Ordered mammogram and [place].  12/31/19 Dr Ronnald Ramp OV: CPE. Labs stable. D/C lisinopril (continue lisinopril/hctz).  Recent consult visits: 11/18/20 Dr Erlinda Hong (ortho): f/u knee OA. Steroid injection given.  Hospital visits: Medication Reconciliation was completed by comparing discharge summary, patient's EMR and Pharmacy list, and upon discussion with patient.  Admitted to the ED on 10/23/20 due to Knee pain. Discharge date was 10/23/20. Discharged from [facility] negative for fracture  Medications that remain the same after Hospital Discharge:??  -All other medications will remain the same.     Objective:  Lab Results  Component Value Date   CREATININE 0.80 12/31/2019   BUN 9 12/31/2019   GFR 76.57 11/24/2018   GFRNONAA 80 12/31/2019   GFRAA 93 12/31/2019   NA 142 12/31/2019   K 4.0 12/31/2019   CALCIUM 9.7 12/31/2019   CO2 29 12/31/2019    GLUCOSE 104 (H) 12/31/2019    Lab Results  Component Value Date/Time   HGBA1C 5.6 11/24/2018 03:37 PM   HGBA1C 5.8 09/29/2017 04:07 PM   GFR 76.57 11/24/2018 03:37 PM   GFR 93.33 02/21/2018 01:45 PM   MICROALBUR 3.59 (H) 05/06/2010 09:12 PM    Last diabetic Eye exam: No results found for: HMDIABEYEEXA  Last diabetic Foot exam: No results found for: HMDIABFOOTEX   Lab Results  Component Value Date   CHOL 154 12/31/2019   HDL 41 (L) 12/31/2019   LDLCALC 94 12/31/2019   LDLDIRECT 97.0 09/29/2017   TRIG 101 12/31/2019   CHOLHDL 3.8 12/31/2019    Hepatic Function Latest Ref Rng & Units 12/31/2019 11/24/2018 11/16/2017  Total Protein 6.1 - 8.1 g/dL 7.3 7.2 7.0  Albumin 3.5 - 5.2 g/dL - 4.1 3.8  AST 10 - 35 U/L '15 18 14  ' ALT 6 - 29 U/L '19 23 18  ' Alk Phosphatase 39 - 117 U/L - 79 78  Total Bilirubin 0.2 - 1.2 mg/dL 0.6 0.5 0.5  Bilirubin, Direct 0.0 - 0.2 mg/dL 0.2 - -    Lab Results  Component Value Date/Time   TSH 2.67 12/31/2019 08:24 AM   TSH 0.69 11/24/2018 03:37 PM   FREET4 1.12 11/24/2018 03:37 PM   FREET4 1.12 09/29/2017 04:07 PM    CBC Latest Ref Rng & Units 12/31/2019 11/24/2018 11/03/2017  WBC 3.8 - 10.8 Thousand/uL 6.3 7.0 6.2  Hemoglobin 11.7 - 15.5 g/dL 13.0 12.1 14.1  Hematocrit 35.0 - 45.0 % 41.0 36.9 44.4  Platelets 140 - 400 Thousand/uL 285  275.0 271    Lab Results  Component Value Date/Time   VD25OH 16.42 (L) 11/24/2018 03:37 PM    Clinical ASCVD: No  The 10-year ASCVD risk score Mikey Bussing DC Jr., et al., 2013) is: 9.9%   Values used to calculate the score:     Age: 81 years     Sex: Female     Is Non-Hispanic African American: Yes     Diabetic: No     Tobacco smoker: No     Systolic Blood Pressure: 078 mmHg     Is BP treated: Yes     HDL Cholesterol: 41 mg/dL     Total Cholesterol: 154 mg/dL    Depression screen [facility] 2/9 06/03/2020 03/03/2018 09/29/2017  Decreased Interest 1 0 0  Down, Depressed, Hopeless 1 0 0  PHQ - 2 Score 2 0 0    Social  History   Tobacco Use  Smoking Status Former   Packs/day: 0.50   Years: 37.00   Pack years: 18.50   Types: Cigarettes  Smokeless Tobacco Never   BP Readings from Last 3 Encounters:  10/23/20 (!) 148/89  06/03/20 120/88  05/16/20 126/78   Pulse Readings from Last 3 Encounters:  10/23/20 91  06/03/20 67  05/16/20 67   Wt Readings from Last 3 Encounters:  10/23/20 212 lb (96.2 kg)  06/03/20 198 lb 3.2 oz (89.9 kg)  05/16/20 202 lb 3.2 oz (91.7 kg)   BMI Readings from Last 3 Encounters:  10/23/20 37.55 kg/m  06/03/20 35.11 kg/m  05/16/20 35.82 kg/m    Assessment/Interventions: Review of patient past medical history, allergies, medications, health status, including review of consultants reports, laboratory and other test data, was performed as part of comprehensive evaluation and provision of chronic care management services.   SDOH:  (Social Determinants of Health) assessments and interventions performed: Yes  SDOH Screenings   Alcohol Screen: Low Risk    Last Alcohol Screening Score (AUDIT): 0  Depression (PHQ2-9): Low Risk    PHQ-2 Score: 2  Financial Resource Strain: Low Risk    Difficulty of Paying Living Expenses: Not hard at all  Food Insecurity: No Food Insecurity   Worried About Charity fundraiser in the Last Year: Never true   Ran Out of Food in the Last Year: Never true  Housing: Low Risk    Last Housing Risk Score: 0  Physical Activity: Inactive   Days of Exercise per Week: 0 days   Minutes of Exercise per Session: 0 min  Social Connections: Moderately Isolated   Frequency of Communication with Friends and Family: More than three times a week   Frequency of Social Gatherings with Friends and Family: Once a week   Attends Religious Services: Never   Marine scientist or Organizations: No   Attends Music therapist: Never   Marital Status: Married  Stress: Stress Concern Present   Feeling of Stress : Very much  Tobacco Use: Medium  Risk   Smoking Tobacco Use: Former   Smokeless Tobacco Use: Never  Transportation Needs: No Data processing manager (Medical): No   Lack of Transportation (Non-Medical): No    CCM Care Plan  Allergies  Allergen Reactions   Latex     REACTION: itchiong    Medications Reviewed Today     Reviewed by Melton Alar, CMA (Certified Medical Assistant) on 11/18/20 at 1443  Med List Status: <None>   Medication Order Taking? Sig Documenting Provider Last Dose  Status Informant  amLODipine (NORVASC) 10 MG tablet [id number] Yes TAKE 1 TABLET BY MOUTH EVERY DAY IN THE MORNING Hoyt Koch, MD Taking Active   benztropine (COGENTIN) 0.5 MG tablet [id number] Yes Take 0.5 mg by mouth daily.  [provider] Taking Active Self  haloperidol (HALDOL) 5 MG tablet [id number] Yes Take 2.5 mg by mouth daily.  [provider] Taking Active Self  haloperidol decanoate (HALDOL DECANOATE) 50 MG/ML injection [id number] Yes Inject 100 mg into the muscle every 28 (twenty-eight) days. [provider] Taking Active Self  levothyroxine (SYNTHROID) 100 MCG tablet [id number] Yes TAKE 1 TABLET BY MOUTH EVERY DAY Hoyt Koch, MD Taking Active   lisinopril-hydrochlorothiazide (ZESTORETIC) 20-12.5 MG tablet [id number] Yes Take 1 tablet by mouth daily. Hoyt Koch, MD Taking Active   Multiple Vitamins-Minerals (MULTIVITAMIN WITH MINERALS) tablet [id number] Yes Take 1 tablet by mouth daily. [provider] Taking Active             Patient Active Problem List   Diagnosis Date Noted   Pain of right breast 05/16/2020   Hyperlipidemia LDL goal <130 12/31/2019   Abnormal urine odor 11/24/2018   Hypokalemia 11/16/2017   Urinary incontinence 09/30/2017   Routine general medical examination at a health care facility 09/30/2017   Paresthesia of both feet 10/17/2014   Obesity 10/16/2009   Hypothyroidism 07/11/2009   Paranoid schizophrenia in  remission ([place]) 06/24/2009   TOBACCO ABUSE 06/24/2009   Essential hypertension 06/24/2009   HX, PERSONAL, MALIGNANCY, BREAST 12/07/2006    Immunization History  Administered Date(s) Administered   PFIZER Comirnaty(Gray Top)Covid-19 Tri-Sucrose Vaccine 05/14/2020   PFIZER(Purple Top)SARS-COV-2 Vaccination 07/20/2019, 08/10/2019   Td 04/20/2007   Tdap 02/23/2018    Conditions to be addressed/monitored:  Hypertension and Schizophrenia  There are no care plans that you recently modified to display for this patient.    Medication Assistance: {MEDASSISTANCEINFO:25044}  Compliance/Adherence/Medication fill history: Care Gaps: HIV/ Hep C screening Cologuard Vaccines - Shingrix, covid booster (due 09/11/20)  Star-Rating Drugs: Lisinopril/HCTZ - LF 11/14/20 x 90 ds  Patient's preferred pharmacy is:  [id number] [place] [id number]: [phone]: [phone] Uses pill box? {Yes or If no, why not?:20788} Pt endorses ***% compliance  We discussed: {Pharmacy options:24294} Patient decided to: {US Pharmacy Plan:23885}  Care Plan and Follow Up Patient Decision:  {FOLLOWUP:24991}  Plan: {CM FOLLOW UP PIRSW:54627} ***    Current Barriers:  {pharmacybarriers:24917}  Pharmacist Clinical Goal(s):  Patient will {PHARMACYGOALCHOICES:24921} through collaboration with PharmD and provider.   Interventions: 1:1 collaboration with CHoyt Koch MD regarding development and update of comprehensive plan of care as evidenced by provider attestation and co-signature Inter-disciplinary care team collaboration (see longitudinal plan of care) Comprehensive medication review performed; medication list updated in electronic medical record  Hypertension    Patient checks BP at home infrequently Patient home BP readings are ranging: n/a   Patient has failed  these meds in the past: n/a Patient is currently controlled on the following medications:  Amlodipine 10 mg daily,  Lisinopril/HCTZ 20-12.5 mg daily   We discussed diet and exercise extensively, BP goals, primary prevention of ASCVD.   Plan: Continue current medications and control with diet and exercise    Hypothyroidism     Patient has failed these meds in past: n/a Patient is currently controlled on the following medications:  Levothyroxine 100 mcg daily  We discussed:  Symptoms of hypo- and hyperthyroidism. Pt reports she is tired a lot of the time, but she does have an old mattress and does not sleep that well, unlikely related to thryoid given borderline low TSH.   Plan: Continue current medications   Schizophrenia    Follows with Dr Rose Phi  Patient has failed these meds in past: n/a Patient is currently controlled on the following medications:  Haloperidol decanoate 100 mg IM q 28 days,  Haloperidol 5 mg daily,  Benztropine 0.5 mg BID    We discussed:  Pt reports she is doing well on current doses, she gets her injection each month at [facility]. She reports Haldol IM is a little expensive but manageable.    Plan: Continue current medications    Osteoporosis screening    Last DEXA Scan: never done before Risk factors: Hx breast cancer (2008) s/p chemotherapy, long term treatment with haloperidol              Patient has failed these meds in past: n/a Patient is currently uncontrolled on the following medications:  Multivitamin   We discussed:  Recommend [phone] units of vitamin D daily. Recommend 1200 mg of calcium daily from dietary and supplemental sources. Recommend weight-bearing and muscle strengthening exercises for building and maintaining bone density.  pt is not taking calcium and vitamin D, OB/GYN had recommended this at last visit along with BMD scan. Pt has not yet scheduled BMD, encouraged her to do so.    Plan Continue current medications   Recommend calcium and Vitamin D supplement   Patient Goals/Self-Care Activities Patient will:  - {pharmacypatientgoals:24919}

## 2020-12-10 NOTE — Progress Notes (Signed)
Needs appt

## 2020-12-11 ENCOUNTER — Telehealth: Payer: Medicare Other

## 2020-12-18 ENCOUNTER — Encounter: Payer: Self-pay | Admitting: Orthopaedic Surgery

## 2020-12-18 ENCOUNTER — Other Ambulatory Visit: Payer: Self-pay

## 2020-12-18 ENCOUNTER — Ambulatory Visit: Payer: Medicare Other | Admitting: Orthopaedic Surgery

## 2020-12-18 VITALS — Ht 63.0 in | Wt 212.0 lb

## 2020-12-18 DIAGNOSIS — S83242A Other tear of medial meniscus, current injury, left knee, initial encounter: Secondary | ICD-10-CM

## 2020-12-18 DIAGNOSIS — M84453A Pathological fracture, unspecified femur, initial encounter for fracture: Secondary | ICD-10-CM | POA: Insufficient documentation

## 2020-12-18 DIAGNOSIS — M84452A Pathological fracture, left femur, initial encounter for fracture: Secondary | ICD-10-CM

## 2020-12-18 NOTE — Progress Notes (Signed)
Office Visit Note   Patient: Jacqueline Berry           Date of Birth: 1960-10-05           MRN: [id number] Visit Date: 12/18/2020              Requested by: Jacqueline Koch, MD [address] [place],  [place] [address] PCP: Jacqueline Koch, MD   Assessment & Plan: Visit Diagnoses:  1. Acute medial meniscus tear of left knee, initial encounter   2. Insufficiency fracture of femur, left, initial encounter [facility])     Plan: MRI of the left knee reviewed shows a large medial meniscal tear near the posterior horn with mild extrusion.  She has insufficiency fracture of the medial femoral condyle as a result with large marrow edema.  She has mild chondromalacia.  Based on these findings I have recommended arthroscopic partial medial meniscectomy and some chondroplasty of the medial femoral condyle insufficiency fracture.  We reviewed risk benefits rehab recovery alternatives to surgery in detail.  Questions encouraged and answered.  Patient would like to have this done as soon as possible.  Follow-Up Instructions: No follow-ups on file.   Orders:  No orders of the defined types were placed in this encounter.  No orders of the defined types were placed in this encounter.     Procedures: No procedures performed   Clinical Data: No additional findings.   Subjective: Chief Complaint  Patient presents with   Left Knee - Follow-up    MRI review    HPI  Jacqueline Berry returns today for left knee MRI review.  Reports that she continues to have pain along with mechanical symptoms of catching and locking and giving way in the left knee.  Review of Systems   Objective: Vital Signs: Ht '5\' 3"'$  (1.6 m)   Wt 212 lb (96.2 kg)   LMP 02/18/2011   BMI 37.55 kg/m   Physical Exam  Ortho Exam Left knee exam is unchanged. Specialty Comments:  No specialty comments available.  Imaging: No results found.   PMFS History: Patient Active Problem List   Diagnosis Date Noted    Insufficiency fracture of femur ([place]) 12/18/2020   Acute medial meniscus tear of left knee 12/18/2020   Pain of right breast 05/16/2020   Hyperlipidemia LDL goal <130 12/31/2019   Abnormal urine odor 11/24/2018   Hypokalemia 11/16/2017   Urinary incontinence 09/30/2017   Routine general medical examination at a health care facility 09/30/2017   Paresthesia of both feet 10/17/2014   Obesity 10/16/2009   Hypothyroidism 07/11/2009   Paranoid schizophrenia in remission ([place]) 06/24/2009   TOBACCO ABUSE 06/24/2009   Essential hypertension 06/24/2009   HX, PERSONAL, MALIGNANCY, BREAST 12/07/2006   Past Medical History:  Diagnosis Date   Bipolar disorder ([place])    Breast cancer ([place])    with chemo - 1997   Hypertension    Syphilis    Thyroid disease    Thyroid disease     Family History  Problem Relation Age of Onset   Stroke Father    Diabetes Brother    Coronary artery disease Brother    Cancer Mother        ? type   Breast cancer Sister        60's    Past Surgical History:  Procedure Laterality Date   BREAST BIOPSY  1997   MASTECTOMY     left breat mastectomy 1997   TUBAL LIGATION  1992   Social History   Occupational History   Not on file  Tobacco Use   Smoking status: Former    Packs/day: 0.50    Years: 37.00    Pack years: 18.50    Types: Cigarettes   Smokeless tobacco: Never  Vaping Use   Vaping Use: Former  Substance and Sexual Activity   Alcohol use: No    Comment: Pt denies   Drug use: No    Comment: Pt denies    Sexual activity: Yes    Birth control/protection: Surgical    Comment: 1st intercourse 60 yo-More than 5 partners

## 2020-12-19 ENCOUNTER — Telehealth: Payer: Self-pay | Admitting: Orthopaedic Surgery

## 2020-12-19 NOTE — Telephone Encounter (Signed)
Patient called asked if she can get a note for her being out for surgery beginning 12/25/2020 until she is allowed to return to work. Patient said the note is for her employer. The number to contact patient is [phone]

## 2020-12-19 NOTE — Telephone Encounter (Signed)
Please advise of length of time out to place on note. Thank you

## 2020-12-22 NOTE — Telephone Encounter (Signed)
2-4 weeks

## 2020-12-23 ENCOUNTER — Telehealth: Payer: Self-pay | Admitting: Orthopaedic Surgery

## 2020-12-23 ENCOUNTER — Other Ambulatory Visit: Payer: Self-pay | Admitting: Physician Assistant

## 2020-12-23 MED ORDER — HYDROCODONE-ACETAMINOPHEN 5-325 MG PO TABS: 1.0000 | ORAL_TABLET | Freq: Three times a day (TID) | ORAL | 0 refills | Status: DC | PRN

## 2020-12-23 NOTE — Telephone Encounter (Signed)
Letter completed.

## 2020-12-23 NOTE — Telephone Encounter (Signed)
Letter placed at the front desk. Pt was informed

## 2020-12-23 NOTE — Telephone Encounter (Signed)
Patient is scheduled for left knee scope, partial medial meniscectomy, subchondroplasty, medial femoral condyle at [facility] on 12-25-20 at 1:30pm.  Patient would like to know if she needs to get her own crutches or will the Surgical Center provide them.  Please call ASAP to let her know.    Pt's cb  [phone]

## 2020-12-24 ENCOUNTER — Telehealth: Payer: Self-pay | Admitting: Orthopaedic Surgery

## 2020-12-24 NOTE — Telephone Encounter (Signed)
I have tried to reach patient x 2. Phone picks up, but I am unable to hear anyone speaking. Will try again.

## 2020-12-24 NOTE — Telephone Encounter (Signed)
I called patient back and advised.

## 2020-12-24 NOTE — Telephone Encounter (Signed)
Surgical center provides them

## 2020-12-24 NOTE — Telephone Encounter (Signed)
I called patient. Duplicate message in chart.

## 2020-12-24 NOTE — Telephone Encounter (Signed)
Pt returned call to [facility]. Pt asked to be called back at [phone].

## 2020-12-24 NOTE — Telephone Encounter (Signed)
Please advise 

## 2020-12-25 ENCOUNTER — Encounter: Payer: Self-pay | Admitting: Orthopaedic Surgery

## 2020-12-25 DIAGNOSIS — S83242A Other tear of medial meniscus, current injury, left knee, initial encounter: Secondary | ICD-10-CM | POA: Diagnosis not present

## 2020-12-25 DIAGNOSIS — M94262 Chondromalacia, left knee: Secondary | ICD-10-CM | POA: Diagnosis not present

## 2020-12-25 DIAGNOSIS — G8918 Other acute postprocedural pain: Secondary | ICD-10-CM | POA: Diagnosis not present

## 2020-12-25 DIAGNOSIS — M84452A Pathological fracture, left femur, initial encounter for fracture: Secondary | ICD-10-CM | POA: Diagnosis not present

## 2020-12-30 ENCOUNTER — Telehealth: Payer: Self-pay | Admitting: Orthopaedic Surgery

## 2020-12-30 NOTE — Telephone Encounter (Signed)
Patient called asked when can she apply Aspercreme on her left knee. The number to contact patient is [phone]

## 2020-12-31 NOTE — Telephone Encounter (Signed)
Yes just not on the incisions

## 2020-12-31 NOTE — Telephone Encounter (Signed)
Patient aware.

## 2021-01-01 ENCOUNTER — Ambulatory Visit (INDEPENDENT_AMBULATORY_CARE_PROVIDER_SITE_OTHER): Payer: Medicare Other | Admitting: Physician Assistant

## 2021-01-01 ENCOUNTER — Encounter: Payer: Self-pay | Admitting: Orthopaedic Surgery

## 2021-01-01 ENCOUNTER — Telehealth: Payer: Self-pay | Admitting: Orthopaedic Surgery

## 2021-01-01 ENCOUNTER — Other Ambulatory Visit: Payer: Self-pay

## 2021-01-01 VITALS — Ht 63.0 in | Wt 212.0 lb

## 2021-01-01 DIAGNOSIS — Z9889 Other specified postprocedural states: Secondary | ICD-10-CM

## 2021-01-01 NOTE — Telephone Encounter (Signed)
Pt called requesting a call back. Pt wanted to ask Dr. Erlinda Hong when will she be able to drive again. Pt phone number is [phone].

## 2021-01-01 NOTE — Telephone Encounter (Signed)
She can drive now if she is not taking painkillers during the day.

## 2021-01-01 NOTE — Progress Notes (Signed)
Post-Op Visit Note   Patient: Jacqueline Berry           Date of Birth: 05-Dec-1960           MRN: [id number] Visit Date: 01/01/2021 PCP: Hoyt Koch, MD   Assessment & Plan:  Chief Complaint:  Chief Complaint  Patient presents with   Left Knee - Follow-up    Left knee arthroscopy 12/25/2020   Visit Diagnoses:  1. S/P arthroscopy of left knee     Plan: Patient is a pleasant 60 year old female who comes in today 1 week status post left knee arthroscopic debridement medial meniscus and some chondroplasty.  She has been doing well.  She has been taking Tylenol arthritis for pain.  She is ambulating with a single-point cane.  Examination of her left knee reveals to fully healed surgical portals.  Nylon sutures in place.  Calf is soft and nontender.  She is neurovascular intact distally.  Today, sutures were removed and Steri-Strips applied.  Intraoperative pictures reviewed.  Home exercise program provided.  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  Follow-up with [place] in 5 weeks time for recheck.  Follow-Up Instructions: Return in about 5 weeks (around 02/05/2021).   Orders:  No orders of the defined types were placed in this encounter.  No orders of the defined types were placed in this encounter.   Imaging: No new imaging  PMFS History: Patient Active Problem List   Diagnosis Date Noted   Insufficiency fracture of femur ([place]) 12/18/2020   Acute medial meniscus tear of left knee 12/18/2020   Pain of right breast 05/16/2020   Hyperlipidemia LDL goal <130 12/31/2019   Abnormal urine odor 11/24/2018   Hypokalemia 11/16/2017   Urinary incontinence 09/30/2017   Routine general medical examination at a health care facility 09/30/2017   Paresthesia of both feet 10/17/2014   Obesity 10/16/2009   Hypothyroidism 07/11/2009   Paranoid schizophrenia in remission ([place]) 06/24/2009   TOBACCO ABUSE  06/24/2009   Essential hypertension 06/24/2009   HX, PERSONAL, MALIGNANCY, BREAST 12/07/2006   Past Medical History:  Diagnosis Date   Bipolar disorder ([place])    Breast cancer ([place])    with chemo - 1997   Hypertension    Syphilis    Thyroid disease    Thyroid disease     Family History  Problem Relation Age of Onset   Stroke Father    Diabetes Brother    Coronary artery disease Brother    Cancer Mother        ? type   Breast cancer Sister        71's    Past Surgical History:  Procedure Laterality Date   BREAST BIOPSY  1997   MASTECTOMY     left breat mastectomy [place]   Social History   Occupational History   Not on file  Tobacco Use   Smoking status: Former    Packs/day: 0.50    Years: 37.00    Pack years: 18.50    Types: Cigarettes   Smokeless tobacco: Never  Vaping Use   Vaping Use: Former  Substance and Sexual Activity   Alcohol use: No    Comment: Pt denies   Drug use: No    Comment: Pt denies    Sexual activity: Yes    Birth control/protection: Surgical  Comment: 1st intercourse 59 yo-More than 5 partners

## 2021-01-02 ENCOUNTER — Other Ambulatory Visit: Payer: Self-pay

## 2021-01-02 ENCOUNTER — Telehealth: Payer: Self-pay | Admitting: Orthopaedic Surgery

## 2021-01-02 DIAGNOSIS — Z9889 Other specified postprocedural states: Secondary | ICD-10-CM

## 2021-01-02 NOTE — Telephone Encounter (Signed)
Pt called requesting to have physical therapy for left knee. Please call pt about this matter at [phone].

## 2021-01-02 NOTE — Telephone Encounter (Signed)
Spoke with patient advised per Kathlee Nations PT order was pt in and someone will be calling her. Also patient can drive provided she does not take any pain medicine

## 2021-01-02 NOTE — Telephone Encounter (Signed)
Patient aware. Mrs. Jacqueline Berry advised on msg below.

## 2021-01-02 NOTE — Telephone Encounter (Signed)
Order made

## 2021-01-02 NOTE — Telephone Encounter (Signed)
Yes that is fine

## 2021-01-05 ENCOUNTER — Telehealth: Payer: Self-pay | Admitting: Orthopaedic Surgery

## 2021-01-05 NOTE — Telephone Encounter (Signed)
Patient called asked asked if she can apply heat to her knee? The number to contact patient is [phone]

## 2021-01-06 ENCOUNTER — Telehealth: Payer: Self-pay | Admitting: Orthopaedic Surgery

## 2021-01-06 NOTE — Telephone Encounter (Signed)
I would suggest ice, not heat as surgery was only two weeks ago

## 2021-01-06 NOTE — Telephone Encounter (Signed)
See other message

## 2021-01-06 NOTE — Telephone Encounter (Signed)
Pt called and states all her stitches are not out. She would also like to know if she could use heat on leg.   CB [phone]

## 2021-01-06 NOTE — Telephone Encounter (Signed)
Pt called requesting a call back from Kathlee Nations about [phone].ut her stitches in her knee. Please call pt at

## 2021-01-07 NOTE — Telephone Encounter (Signed)
FYI    I called patient. She feels that there is still a stitch in her leg. She states that it is not cut and is tight and looks like it is looped around, no knot. She is also complaining of increased tightness in the knee and leg. I advised patient that this may be due to swelling post op and advised to elevate higher than her heart when sitting and see if this gets better. I made her an appt to come back in to have possible stitch removed. I was going to put her on nurse schedule, however, I did not know if Dr. Erlinda Hong would rather see her with the increased tightness. Patient picked next Thursday for her follow up appt.  Appt made with Dr. Erlinda Hong that morning.

## 2021-01-07 NOTE — Telephone Encounter (Signed)
I called patient and advised. 

## 2021-01-08 NOTE — Telephone Encounter (Signed)
Yeah nurse visit to check stitch is fine.  Thanks.

## 2021-01-13 ENCOUNTER — Ambulatory Visit: Payer: Medicare Other | Admitting: Rehabilitative and Restorative Service Providers"

## 2021-01-13 ENCOUNTER — Other Ambulatory Visit: Payer: Self-pay

## 2021-01-13 ENCOUNTER — Encounter: Payer: Self-pay | Admitting: Rehabilitative and Restorative Service Providers"

## 2021-01-13 DIAGNOSIS — M6281 Muscle weakness (generalized): Secondary | ICD-10-CM

## 2021-01-13 DIAGNOSIS — R262 Difficulty in walking, not elsewhere classified: Secondary | ICD-10-CM

## 2021-01-13 DIAGNOSIS — G8929 Other chronic pain: Secondary | ICD-10-CM | POA: Diagnosis not present

## 2021-01-13 DIAGNOSIS — M25562 Pain in left knee: Secondary | ICD-10-CM | POA: Diagnosis not present

## 2021-01-13 DIAGNOSIS — R600 Localized edema: Secondary | ICD-10-CM

## 2021-01-13 NOTE — Therapy (Signed)
[facility] [address] [place], [place], [address] Phone: [phone]   Fax:  [phone]  Physical Therapy Evaluation  Patient Details  Name: Jacqueline Berry MRN: [id number] Date of Birth: 1960-12-20 Referring Provider (PT): Leandrew Koyanagi, MD   Encounter Date: 01/13/2021   PT End of Session - 01/13/21 1508     Visit Number 1    Number of Visits 20    Date for PT Re-Evaluation 03/24/21    Authorization Type UHC Medicare $30 co pay    PT Start Time 1520    PT Stop Time 1550    PT Time Calculation (min) 30 min    Activity Tolerance Patient tolerated treatment well    Behavior During Therapy [facility] for tasks assessed/performed             Past Medical History:  Diagnosis Date   Bipolar disorder ([place])    Breast cancer ([place])    with chemo - 1997   Hypertension    Syphilis    Thyroid disease    Thyroid disease     Past Surgical History:  Procedure Laterality Date   BREAST BIOPSY  1997   MASTECTOMY     left breat mastectomy [place]    There were no vitals filed for this visit.    Subjective Assessment - 01/13/21 1523     Subjective Pt. comes to clinic s/p Lt knee debridement of medial meniscus.  Pt. indicated she was having trouble walking due to pain for a few weeks prior to surgery at the beginning of Sept.    Limitations Standing;Walking;House hold activities;Lifting;Sitting    Patient Stated Goals Reduce pain, walking better    Currently in Pain? Yes    Pain Score 5     Pain Location Knee    Pain Orientation Left    Pain Descriptors / Indicators Sharp;Tightness    Pain Type Surgical pain    Pain Onset 1 to 4 weeks ago    Pain Frequency Intermittent    Aggravating Factors  walking, lying in bed, stairs    Pain Relieving Factors OTC medicine                [facility] PT Assessment - 01/13/21 0001       Assessment   Medical Diagnosis Z98.890 (ICD-10-CM) - S/P arthroscopy of left knee     Referring Provider (PT) Leandrew Koyanagi, MD    Onset Date/Surgical Date 12/18/20      Precautions   Precautions None      Restrictions   Weight Bearing Restrictions No      Balance Screen   Has the patient fallen in the past 6 months Yes    How many times? 1    Has the patient had a decrease in activity level because of a fear of falling?  No    Is the patient reluctant to leave their home because of a fear of falling?  No      Home [place] residence    Living Arrangements Spouse/significant other;Other (Comment)   mother in law   Additional Comments 2-3 stairs with no handrails      Prior Function   Vocation Requirements Work as Engineer, manufacturing systems.  Has returned to work but feels limited.  Sits, walks and stands at times    Leisure Watch TV      Cognition   Overall Cognitive  Status Within Functional Limits for tasks assessed      Observation/Other Assessments   Focus on Therapeutic Outcomes (FOTO)  intake 47%, predicted 61%      Functional Tests   Functional tests Single leg stance      Single Leg Stance   Comments Rt SLS 5 seconds, Lt < 3 seconds      ROM / Strength   AROM / PROM / Strength AROM;PROM;Strength      AROM   AROM Assessment Site Knee    Right/Left Shoulder --    Right/Left Knee Right;Left    Right Knee Extension 0    Right Knee Flexion 140    Left Knee Extension 0    Left Knee Flexion 130   mild pain, measured in supine heel slide     PROM   PROM Assessment Site Shoulder    Right/Left Shoulder Left;Right      Strength   Strength Assessment Site Knee;Hip;Ankle    Right/Left Shoulder --    Right/Left Elbow Right;Left    Right/Left Hip Left;Right    Right Hip Flexion 5/5    Left Hip Flexion 5/5    Right/Left Knee Left;Right    Right Knee Flexion 5/5    Right Knee Extension 5/5   47, 42.5 lbs   Left Knee Flexion 5/5    Left Knee Extension 4/5   26.8, 27 lb   Right/Left Ankle Left;Right    Right Ankle  Dorsiflexion 5/5    Left Ankle Dorsiflexion 5/5      Palpation   Palpation comment Mild tenderness near incision ports      Ambulation/Gait   Gait Comments Mild reduction in stance phase, maintained knee flexion in stance on Lt                        Objective measurements completed on examination: See above findings.       [place] Adult PT Treatment/Exercise - 01/13/21 0001       Exercises   Exercises Other Exercises;Knee/Hip    Other Exercises  HEP instruction/performance c cues for techniques, handout provided.  Trial set performed of each for comprehension and symptom assessment.  HEP consisting of supine SLR, heel slide, seated LAQ, sit to stand to sit tranfers per handout                     PT Education - 01/13/21 1508     Education Details HEP, POC    Person(s) Educated Patient    Methods Explanation;Demonstration;Verbal cues;Handout    Comprehension Returned demonstration;Verbalized understanding              PT Short Term Goals - 01/13/21 1508       PT SHORT TERM GOAL #1   Title Patient will demonstrate independent use of home exercise program to maintain progress from in clinic treatments.    Time 3    Period Weeks    Status New    Target Date 02/03/21               PT Long Term Goals - 01/13/21 1509       PT LONG TERM GOAL #1   Title Patient will demonstrate/report pain at worst less than or equal to 2/10 to facilitate minimal limitation in daily activity secondary to pain symptoms.    Time 10    Period Weeks    Status New    Target Date 03/24/21  PT LONG TERM GOAL #2   Title Patient will demonstrate independent use of home exercise program to facilitate ability to maintain/progress functional gains from skilled physical therapy services.    Time 10    Period Weeks    Status New    Target Date 03/24/21      PT LONG TERM GOAL #3   Title Pt. will demonstrate FOTO outcome > or = 61% to indicated reduced  disability due to condition.    Time 10    Period Weeks    Status New    Target Date 03/24/21      PT LONG TERM GOAL #4   Title Patient will demonstrate Lt LE MMT 5/5 throughout to facilitate ability to perform usual standing, walking, stairs at PLOF s limitation due to symptoms.    Time 10    Period Weeks    Status New    Target Date 03/24/21      PT LONG TERM GOAL #5   Title Patient will demonstrate Lt  knee AROM 0-110 degrees to facilitate ability to perform transfers, sitting, ambulation, stair navigation s restriction due to mobility.    Time 10    Period Weeks    Status New    Target Date 03/24/21                    Plan - 01/13/21 1511     Clinical Impression Statement Patient is a 60 y.o. who comes to clinic with complaints of Lt knee pain s/p recent arthroscopic surgery with mobility, strength and movement coordination deficits that impair their ability to perform usual daily and recreational functional activities without increase difficulty/symptoms at this time.  Patient to benefit from skilled PT services to address impairments and limitations to improve to previous level of function without restriction secondary to condition.    Examination-Activity Limitations Sit;Squat;Stairs;Stand;Carry;Lift;Reach Overhead    Examination-Participation Restrictions Community Activity;Occupation;Meal Prep;Laundry;Interpersonal Relationship    Stability/Clinical Decision Making Stable/Uncomplicated    Clinical Decision Making Low    Rehab Potential Good    PT Frequency --   1-2x/week   PT Duration Other (comment)   10 weeks   PT Treatment/Interventions ADLs/Self Care Home Management;Cryotherapy;Electrical Stimulation;Iontophoresis 4mg /ml Dexamethasone;Moist Heat;Balance training;Therapeutic exercise;Therapeutic activities;Functional mobility training;Stair training;Gait training;Patient/family education;DME Instruction;Ultrasound;Neuromuscular re-education;Passive range of  motion;Spinal Manipulations;Joint Manipulations;Dry needling;Taping;Vasopneumatic Device;Manual techniques    PT Next Visit Plan Continued quad strengthening    PT Home Exercise Plan [email or web address]    Consulted and Agree with Plan of Care Patient             Patient will benefit from skilled therapeutic intervention in order to improve the following deficits and impairments:  Abnormal gait, Hypomobility, Decreased activity tolerance, Decreased strength, Pain, Difficulty walking, Decreased mobility, Decreased balance, Decreased range of motion, Improper body mechanics, Impaired perceived functional ability, Impaired flexibility, Decreased coordination  Visit Diagnosis: Chronic pain of left knee  Muscle weakness (generalized)  Difficulty in walking, not elsewhere classified  Localized edema     Problem List Patient Active Problem List   Diagnosis Date Noted   Insufficiency fracture of femur ([place]) 12/18/2020   Acute medial meniscus tear of left knee 12/18/2020   Pain of right breast 05/16/2020   Hyperlipidemia LDL goal <130 12/31/2019   Abnormal urine odor 11/24/2018   Hypokalemia 11/16/2017   Urinary incontinence 09/30/2017   Routine general medical examination at a health care facility 09/30/2017   Paresthesia of both feet 10/17/2014   Obesity 10/16/2009   Hypothyroidism  07/11/2009   Paranoid schizophrenia in remission ([place]) 06/24/2009   TOBACCO ABUSE 06/24/2009   Essential hypertension 06/24/2009   HX, PERSONAL, MALIGNANCY, BREAST 12/07/2006   Scot Jun, PT, DPT, OCS, ATC 01/13/21  4:32 PM   [facility] [address] [place], [place], [address] Phone: [phone]   Fax:  [phone]  Name: Jacqueline Berry MRN: [id number] Date of Birth: 1961/03/05

## 2021-01-15 ENCOUNTER — Other Ambulatory Visit: Payer: Self-pay

## 2021-01-15 ENCOUNTER — Ambulatory Visit: Payer: Medicare Other | Admitting: Orthopaedic Surgery

## 2021-01-15 ENCOUNTER — Encounter: Payer: Self-pay | Admitting: Orthopaedic Surgery

## 2021-01-15 DIAGNOSIS — Z9889 Other specified postprocedural states: Secondary | ICD-10-CM

## 2021-01-15 DIAGNOSIS — S83242A Other tear of medial meniscus, current injury, left knee, initial encounter: Secondary | ICD-10-CM

## 2021-01-15 DIAGNOSIS — M84452A Pathological fracture, left femur, initial encounter for fracture: Secondary | ICD-10-CM

## 2021-01-15 NOTE — Progress Notes (Signed)
Post-Op Visit Note   Patient: Jacqueline Berry           Date of Birth: [birth year]           MRN: [id number] Visit Date: 01/15/2021 PCP: Jacqueline Koch, MD   Assessment & Plan:  Chief Complaint:  Chief Complaint  Patient presents with   Left Knee - Routine Post Op, Follow-up   Visit Diagnoses:  1. S/P arthroscopy of left knee   2. Insufficiency fracture of femur, left, initial encounter ([place])   3. Acute medial meniscus tear of left knee, initial encounter     Plan: Roshawnda is coming in today to get the portal sites checked out.  She is concerned that there is a retained suture.  She has actually returned back to work since we last saw her.  She is doing well.  She is getting physical therapy at our office.  No real complaints today otherwise.  The left knee surgical scars are fully healed.  I am not able to feel any buried sutures underneath the skin.  The symptoms are likely due to just portal site incisional pain which is normal and will resolve with time.  Reassurance was provided.  She is scheduled to see me back on October 20 but she is welcome to cancel that appointment if she is doing well at that time and does not need to see me.  Follow-Up Instructions: Return for as scheduled.   Orders:  No orders of the defined types were placed in this encounter.  No orders of the defined types were placed in this encounter.   Imaging: No results found.  PMFS History: Patient Active Problem List   Diagnosis Date Noted   Insufficiency fracture of femur ([place]) 12/18/2020   Acute medial meniscus tear of left knee 12/18/2020   Pain of right breast 05/16/2020   Hyperlipidemia LDL goal <130 12/31/2019   Abnormal urine odor 11/24/2018   Hypokalemia 11/16/2017   Urinary incontinence 09/30/2017   Routine general medical examination at a health care facility 09/30/2017   Paresthesia of both feet 10/17/2014   Obesity 10/16/2009   Hypothyroidism 07/11/2009   Paranoid  schizophrenia in remission ([place]) 06/24/2009   TOBACCO ABUSE 06/24/2009   Essential hypertension 06/24/2009   HX, PERSONAL, MALIGNANCY, BREAST 12/07/2006   Past Medical History:  Diagnosis Date   Bipolar disorder ([place])    Breast cancer ([place])    with chemo - 1997   Hypertension    Syphilis    Thyroid disease    Thyroid disease     Family History  Problem Relation Age of Onset   Stroke Father    Diabetes Brother    Coronary artery disease Brother    Cancer Mother        ? type   Breast cancer Sister        59's    Past Surgical History:  Procedure Laterality Date   BREAST BIOPSY  1997   MASTECTOMY     left breat mastectomy [place]   Social History   Occupational History   Not on file  Tobacco Use   Smoking status: Former    Packs/day: 0.50    Years: 37.00    Pack years: 18.50    Types: Cigarettes   Smokeless tobacco: Never  Vaping Use   Vaping Use: Former  Substance and Sexual Activity   Alcohol use: No    Comment: Pt denies  Drug use: No    Comment: Pt denies    Sexual activity: Yes    Birth control/protection: Surgical    Comment: 1st intercourse 60 yo-More than 5 partners

## 2021-01-21 ENCOUNTER — Ambulatory Visit (INDEPENDENT_AMBULATORY_CARE_PROVIDER_SITE_OTHER): Payer: Medicare Other | Admitting: Rehabilitative and Restorative Service Providers"

## 2021-01-21 ENCOUNTER — Other Ambulatory Visit: Payer: Self-pay

## 2021-01-21 ENCOUNTER — Encounter: Payer: Self-pay | Admitting: Rehabilitative and Restorative Service Providers"

## 2021-01-21 DIAGNOSIS — G8929 Other chronic pain: Secondary | ICD-10-CM

## 2021-01-21 DIAGNOSIS — R262 Difficulty in walking, not elsewhere classified: Secondary | ICD-10-CM | POA: Diagnosis not present

## 2021-01-21 DIAGNOSIS — M6281 Muscle weakness (generalized): Secondary | ICD-10-CM

## 2021-01-21 DIAGNOSIS — R600 Localized edema: Secondary | ICD-10-CM | POA: Diagnosis not present

## 2021-01-21 DIAGNOSIS — M25562 Pain in left knee: Secondary | ICD-10-CM | POA: Diagnosis not present

## 2021-01-21 NOTE — Therapy (Signed)
[facility] [place], [place], [address] Phone: [phone]   Fax:  [phone]  Physical Therapy Treatment  Patient Details  Name: Jacqueline Berry MRN: [id number] Date of Birth: [birth year] Referring Provider (PT): Leandrew Koyanagi, MD   Encounter Date: 01/21/2021   PT End of Session - 01/21/21 0927     Visit Number 2    Number of Visits 20    Date for PT Re-Evaluation 03/24/21    Authorization Type UHC Medicare $30 co pay    PT Start Time 0845    PT Stop Time 0925    PT Time Calculation (min) 40 min    Activity Tolerance Patient tolerated treatment well;No increased pain    Behavior During Therapy WFL for tasks assessed/performed             Past Medical History:  Diagnosis Date   Bipolar disorder ([place])    Breast cancer ([place])    with chemo - 1997   Hypertension    Syphilis    Thyroid disease    Thyroid disease     Past Surgical History:  Procedure Laterality Date   BREAST BIOPSY  1997   MASTECTOMY     left breat mastectomy [place]    There were no vitals filed for this visit.   Subjective Assessment - 01/21/21 0912     Subjective [name] reports good early HEP compliance.  She is taking 1 Tylenol/day before bed.    Limitations Standing;Walking;House hold activities;Lifting;Sitting    How long can you sit comfortably? Start-up stiffness    How long can you stand comfortably? 30 minutes    How long can you walk comfortably? 15 minutes    Patient Stated Goals Reduce pain, walking better    Currently in Pain? Yes    Pain Score 1     Pain Location Knee    Pain Orientation Left    Pain Descriptors / Indicators Aching;Tightness    Pain Type Surgical pain    Pain Onset 1 to 4 weeks ago    Pain Frequency Intermittent    Aggravating Factors  Too much WB, late day fatigue    Pain Relieving Factors Tylenol    Effect of Pain on Daily Activities Stairs, not normal endurance or strength yet     Multiple Pain Sites No                               [facility] Adult PT Treatment/Exercise - 01/21/21 0001       Neuro Re-ed    Neuro Re-ed Details  Attempted single leg stance (very limited) and tandem balance 5X 20 seconds each foot in front      Exercises   Exercises Knee/Hip      Knee/Hip Exercises: Seated   Long Arc Quad Strengthening;Both;3 sets;5 reps    Long Arc Quad Limitations Seated straight leg raises (toes back, press knee down, tighten thigh, hold 3 seconds, lift heel 6-8 inches and slowly lower while keeping knee straight)    Sit to Sand 10 reps;without UE support;Other (comment)   slow eccentrics     Knee/Hip Exercises: Supine   Straight Leg Raises Strengthening;Left;10 reps;Limitations    Straight Leg Raises Limitations Raise to height of other (bent) knee    Other Supine Knee/Hip Exercises Heel slides 10X 5 seconds  PT Education - 01/21/21 0926     Education Details Reviewed day 1 HEP with corrections as needed.  Added tandem balance to HEP.    Person(s) Educated Patient    Methods Explanation;Demonstration;Tactile cues;Verbal cues;Handout    Comprehension Verbalized understanding;Tactile cues required;Need further instruction;Returned demonstration;Verbal cues required              PT Short Term Goals - 01/13/21 1508       PT SHORT TERM GOAL #1   Title Patient will demonstrate independent use of home exercise program to maintain progress from in clinic treatments.    Time 3    Period Weeks    Status New    Target Date 02/03/21               PT Long Term Goals - 01/13/21 1509       PT LONG TERM GOAL #1   Title Patient will demonstrate/report pain at worst less than or equal to 2/10 to facilitate minimal limitation in daily activity secondary to pain symptoms.    Time 10    Period Weeks    Status New    Target Date 03/24/21      PT LONG TERM GOAL #2   Title Patient will demonstrate  independent use of home exercise program to facilitate ability to maintain/progress functional gains from skilled physical therapy services.    Time 10    Period Weeks    Status New    Target Date 03/24/21      PT LONG TERM GOAL #3   Title Pt. will demonstrate FOTO outcome > or = 61% to indicated reduced disability due to condition.    Time 10    Period Weeks    Status New    Target Date 03/24/21      PT LONG TERM GOAL #4   Title Patient will demonstrate Lt LE MMT 5/5 throughout to facilitate ability to perform usual standing, walking, stairs at PLOF s limitation due to symptoms.    Time 10    Period Weeks    Status New    Target Date 03/24/21      PT LONG TERM GOAL #5   Title Patient will demonstrate Lt  knee AROM 0-110 degrees to facilitate ability to perform transfers, sitting, ambulation, stair navigation s restriction due to mobility.    Time 10    Period Weeks    Status New    Target Date 03/24/21                   Plan - 01/21/21 2500     Clinical Impression Statement Terisa reports early complaince with her HEP.  Quadriceps strength, proprioception and balance are the early emphasis.  Progressions were made to quadriceps strength.  Prognosis remains good with consistent attendance and HEP compliance.    Examination-Activity Limitations Sit;Squat;Stairs;Stand;Carry;Lift;Reach Overhead    Examination-Participation Restrictions Community Activity;Occupation;Meal Prep;Laundry;Interpersonal Relationship    Stability/Clinical Decision Making Stable/Uncomplicated    Rehab Potential Good    PT Frequency --   1-2x/week   PT Duration Other (comment)   10 weeks   PT Treatment/Interventions ADLs/Self Care Home Management;Cryotherapy;Electrical Stimulation;Iontophoresis 4mg /ml Dexamethasone;Moist Heat;Balance training;Therapeutic exercise;Therapeutic activities;Functional mobility training;Stair training;Gait training;Patient/family education;DME  Instruction;Ultrasound;Neuromuscular re-education;Passive range of motion;Spinal Manipulations;Joint Manipulations;Dry needling;Taping;Vasopneumatic Device;Manual techniques    PT Next Visit Plan Continued quad strengthening, proprioception and balance    PT Home Exercise Plan [email or web address]    Consulted and Agree with Plan of Care Patient  Patient will benefit from skilled therapeutic intervention in order to improve the following deficits and impairments:  Abnormal gait, Hypomobility, Decreased activity tolerance, Decreased strength, Pain, Difficulty walking, Decreased mobility, Decreased balance, Decreased range of motion, Improper body mechanics, Impaired perceived functional ability, Impaired flexibility, Decreased coordination  Visit Diagnosis: Muscle weakness (generalized)  Difficulty in walking, not elsewhere classified  Localized edema  Chronic pain of left knee     Problem List Patient Active Problem List   Diagnosis Date Noted   Insufficiency fracture of femur ([place]) 12/18/2020   Acute medial meniscus tear of left knee 12/18/2020   Pain of right breast 05/16/2020   Hyperlipidemia LDL goal <130 12/31/2019   Abnormal urine odor 11/24/2018   Hypokalemia 11/16/2017   Urinary incontinence 09/30/2017   Routine general medical examination at a health care facility 09/30/2017   Paresthesia of both feet 10/17/2014   Obesity 10/16/2009   Hypothyroidism 07/11/2009   Paranoid schizophrenia in remission ([place]) 06/24/2009   TOBACCO ABUSE 06/24/2009   Essential hypertension 06/24/2009   HX, PERSONAL, MALIGNANCY, BREAST 12/07/2006    Farley Ly, PT, MPT 01/21/2021, 2:55 PM  [facility] [address] [place], [place], [address] Phone: [phone]   Fax:  [phone]  Name: GLESSIE EUSTICE MRN: [id number] Date of Birth: [birth year]

## 2021-01-21 NOTE — Patient Instructions (Addendum)
Same HEP, emphasis on seated SLR and sit to stand, added tandem balance

## 2021-01-30 ENCOUNTER — Ambulatory Visit (INDEPENDENT_AMBULATORY_CARE_PROVIDER_SITE_OTHER): Payer: Medicare Other | Admitting: Rehabilitative and Restorative Service Providers"

## 2021-01-30 ENCOUNTER — Encounter: Payer: Self-pay | Admitting: Rehabilitative and Restorative Service Providers"

## 2021-01-30 ENCOUNTER — Other Ambulatory Visit: Payer: Self-pay

## 2021-01-30 DIAGNOSIS — M6281 Muscle weakness (generalized): Secondary | ICD-10-CM | POA: Diagnosis not present

## 2021-01-30 DIAGNOSIS — R600 Localized edema: Secondary | ICD-10-CM

## 2021-01-30 DIAGNOSIS — R262 Difficulty in walking, not elsewhere classified: Secondary | ICD-10-CM

## 2021-01-30 DIAGNOSIS — M25562 Pain in left knee: Secondary | ICD-10-CM

## 2021-01-30 DIAGNOSIS — G8929 Other chronic pain: Secondary | ICD-10-CM

## 2021-01-30 NOTE — Patient Instructions (Addendum)
Access Code: [email or web address] URL: [email or web address] Date: 01/30/2021 Prepared by: Vista Mink  Exercises Supine Heel Slide (Mirrored) - 2 x daily - 7 x weekly - 3 sets - 10 reps - 2 hold Sit to Stand - 2-3 x daily - 7 x weekly - 1 sets - 10 reps Tandem Stance - 2 x daily - 7 x weekly - 1 sets - 5 reps - 20 second hold Seated Straight Leg Raise - 2-3 x daily - 7 x weekly - 3-5 sets - 5 reps - 3 seconds hold (30-50/Day)

## 2021-01-30 NOTE — Therapy (Signed)
[place], [place], [address] Phone: [phone]   Fax:  [phone]  Physical Therapy Treatment  Patient Details  Name: GEORGEANN BRINKMAN MRN: [id number] Date of Birth: 04/13/1961 Referring Provider (PT): Leandrew Koyanagi, MD   Encounter Date: 01/30/2021   PT End of Session - 01/30/21 1659     Visit Number 3    Number of Visits 20    Date for PT Re-Evaluation 03/24/21    Authorization Type UHC Medicare $30 co pay    PT Start Time 1345    PT Stop Time 1430    PT Time Calculation (min) 45 min    Activity Tolerance Patient tolerated treatment well;No increased pain    Behavior During Therapy WFL for tasks assessed/performed             Past Medical History:  Diagnosis Date   Bipolar disorder ([place])    Breast cancer ([place])    with chemo - 1997   Hypertension    Syphilis    Thyroid disease    Thyroid disease     Past Surgical History:  Procedure Laterality Date   BREAST BIOPSY  1997   MASTECTOMY     left breat mastectomy [place]    There were no vitals filed for this visit.   Subjective Assessment - 01/30/21 1429     Subjective Alexi reports inconsistent HEP over the past week.    Limitations Standing;Walking;House hold activities;Lifting;Sitting    How long can you sit comfortably? Start-up stiffness    How long can you stand comfortably? 30 minutes    How long can you walk comfortably? 15 minutes    Patient Stated Goals Reduce pain, walking better    Currently in Pain? Yes    Pain Score 3     Pain Location Knee    Pain Orientation Left    Pain Descriptors / Indicators Aching    Pain Type Surgical pain    Pain Onset More than a month ago    Pain Frequency Intermittent    Aggravating Factors  Late day fatigue and trouble with prolonged WB    Pain Relieving Factors Tylenol    Effect of Pain on Daily Activities Stairs, limited endurance and strength    Multiple Pain Sites No                                [facility] Adult PT Treatment/Exercise - 01/30/21 0001       Neuro Re-ed    Neuro Re-ed Details  Attempted single leg stance (very limited) and tandem balance 5X 20 seconds each foot in front      Exercises   Exercises Knee/Hip      Knee/Hip Exercises: Aerobic   Stationary Bike Seat 4 for 8 minutes Level 3      Knee/Hip Exercises: Machines for Strengthening   Cybex Leg Press 100# DL and 50# single leg slow eccentrics 15X each      Knee/Hip Exercises: Seated   Long Arc Quad Strengthening;Both;3 sets;5 reps    Long Arc Quad Limitations Seated straight leg raises (toes back, press knee down, tighten thigh, hold 3 seconds, lift heel 6-8 inches and slowly lower while keeping knee straight)    Sit to Sand 10 reps;without UE support;Other (comment)   slow eccentrics     Knee/Hip Exercises: Supine   Other Supine  Knee/Hip Exercises Heel slides 10X 5 seconds                     PT Education - 01/30/21 1659     Education Details Reviewed HEP with particular emphasis on quadriceps strength, balance and stability.    Person(s) Educated Patient    Methods Explanation;Demonstration;Tactile cues;Verbal cues;Handout    Comprehension Verbalized understanding;Tactile cues required;Need further instruction;Returned demonstration;Verbal cues required              PT Short Term Goals - 01/13/21 1508       PT SHORT TERM GOAL #1   Title Patient will demonstrate independent use of home exercise program to maintain progress from in clinic treatments.    Time 3    Period Weeks    Status New    Target Date 02/03/21               PT Long Term Goals - 01/13/21 1509       PT LONG TERM GOAL #1   Title Patient will demonstrate/report pain at worst less than or equal to 2/10 to facilitate minimal limitation in daily activity secondary to pain symptoms.    Time 10    Period Weeks    Status New    Target Date 03/24/21      PT  LONG TERM GOAL #2   Title Patient will demonstrate independent use of home exercise program to facilitate ability to maintain/progress functional gains from skilled physical therapy services.    Time 10    Period Weeks    Status New    Target Date 03/24/21      PT LONG TERM GOAL #3   Title Pt. will demonstrate FOTO outcome > or = 61% to indicated reduced disability due to condition.    Time 10    Period Weeks    Status New    Target Date 03/24/21      PT LONG TERM GOAL #4   Title Patient will demonstrate Lt LE MMT 5/5 throughout to facilitate ability to perform usual standing, walking, stairs at PLOF s limitation due to symptoms.    Time 10    Period Weeks    Status New    Target Date 03/24/21      PT LONG TERM GOAL #5   Title Patient will demonstrate Lt  knee AROM 0-110 degrees to facilitate ability to perform transfers, sitting, ambulation, stair navigation s restriction due to mobility.    Time 10    Period Weeks    Status New    Target Date 03/24/21                   Plan - 01/30/21 1700     Clinical Impression Statement HEP compliance was not as good over the past week.  Emphasis remains quadriceps strength with attention also paid to balance and stability.  Jaleah's prognosis remains good with improved HEP compliance, particularly with quadriceps strengthening.    Examination-Activity Limitations Sit;Squat;Stairs;Stand;Carry;Lift;Reach Overhead    Examination-Participation Restrictions Community Activity;Occupation;Meal Prep;Laundry;Interpersonal Relationship    Stability/Clinical Decision Making Stable/Uncomplicated    Rehab Potential Good    PT Frequency --   1-2x/week   PT Duration Other (comment)   10 weeks   PT Treatment/Interventions ADLs/Self Care Home Management;Cryotherapy;Electrical Stimulation;Iontophoresis 4mg /ml Dexamethasone;Moist Heat;Balance training;Therapeutic exercise;Therapeutic activities;Functional mobility training;Stair training;Gait  training;Patient/family education;DME Instruction;Ultrasound;Neuromuscular re-education;Passive range of motion;Spinal Manipulations;Joint Manipulations;Dry needling;Taping;Vasopneumatic Device;Manual techniques    PT Next Visit Plan Continued quadriceps  strengthening (eccentrics emphasis), proprioception and balance    PT Home Exercise Plan [email or web address]    Consulted and Agree with Plan of Care Patient             Patient will benefit from skilled therapeutic intervention in order to improve the following deficits and impairments:  Abnormal gait, Hypomobility, Decreased activity tolerance, Decreased strength, Pain, Difficulty walking, Decreased mobility, Decreased balance, Decreased range of motion, Improper body mechanics, Impaired perceived functional ability, Impaired flexibility, Decreased coordination  Visit Diagnosis: Difficulty in walking, not elsewhere classified  Muscle weakness (generalized)  Localized edema  Chronic pain of left knee     Problem List Patient Active Problem List   Diagnosis Date Noted   Insufficiency fracture of femur ([place]) 12/18/2020   Acute medial meniscus tear of left knee 12/18/2020   Pain of right breast 05/16/2020   Hyperlipidemia LDL goal <130 12/31/2019   Abnormal urine odor 11/24/2018   Hypokalemia 11/16/2017   Urinary incontinence 09/30/2017   Routine general medical examination at a health care facility 09/30/2017   Paresthesia of both feet 10/17/2014   Obesity 10/16/2009   Hypothyroidism 07/11/2009   Paranoid schizophrenia in remission ([place]) 06/24/2009   TOBACCO ABUSE 06/24/2009   Essential hypertension 06/24/2009   HX, PERSONAL, MALIGNANCY, BREAST 12/07/2006    Farley Ly, PT, MPT 01/30/2021, 5:03 PM  [facility] [address] [place], [place], [address] Phone: [phone]   Fax:  [phone]  Name: ROZELL KETTLEWELL MRN: [id number] Date of Birth: 1960-08-26

## 2021-02-06 ENCOUNTER — Other Ambulatory Visit: Payer: Self-pay | Admitting: Internal Medicine

## 2021-02-09 ENCOUNTER — Telehealth: Payer: Self-pay | Admitting: Internal Medicine

## 2021-02-09 NOTE — Telephone Encounter (Signed)
Left Jacqueline Berry a voicemail on 02-09-2021 to scheduled her for a med refill appt.   If she calls back, please schedule the appt

## 2021-02-11 ENCOUNTER — Other Ambulatory Visit: Payer: Self-pay

## 2021-02-11 ENCOUNTER — Encounter: Payer: Self-pay | Admitting: Internal Medicine

## 2021-02-11 ENCOUNTER — Ambulatory Visit (INDEPENDENT_AMBULATORY_CARE_PROVIDER_SITE_OTHER): Payer: Medicare Other | Admitting: Internal Medicine

## 2021-02-11 VITALS — BP 134/86 | HR 82 | Resp 18 | Ht 63.0 in | Wt 206.2 lb

## 2021-02-11 DIAGNOSIS — Z0000 Encounter for general adult medical examination without abnormal findings: Secondary | ICD-10-CM | POA: Diagnosis not present

## 2021-02-11 DIAGNOSIS — Z853 Personal history of malignant neoplasm of breast: Secondary | ICD-10-CM

## 2021-02-11 DIAGNOSIS — E039 Hypothyroidism, unspecified: Secondary | ICD-10-CM

## 2021-02-11 DIAGNOSIS — E785 Hyperlipidemia, unspecified: Secondary | ICD-10-CM | POA: Diagnosis not present

## 2021-02-11 DIAGNOSIS — I10 Essential (primary) hypertension: Secondary | ICD-10-CM

## 2021-02-11 DIAGNOSIS — Z1211 Encounter for screening for malignant neoplasm of colon: Secondary | ICD-10-CM | POA: Diagnosis not present

## 2021-02-11 LAB — COMPREHENSIVE METABOLIC PANEL
ALT: 17 U/L (ref 0–35)
AST: 18 U/L (ref 0–37)
Albumin: 4 g/dL (ref 3.5–5.2)
Alkaline Phosphatase: 104 U/L (ref 39–117)
BUN: 9 mg/dL (ref 6–23)
CO2: 30 mEq/L (ref 19–32)
Calcium: 9.3 mg/dL (ref 8.4–10.5)
Chloride: 103 mEq/L (ref 96–112)
Creatinine, Ser: 0.79 mg/dL (ref 0.40–1.20)
GFR: 80.87 mL/min (ref >60.00)
Glucose, Bld: 143 mg/dL — ABNORMAL HIGH (ref 70–99)
Potassium: 3.3 mEq/L — ABNORMAL LOW (ref 3.5–5.1)
Sodium: 141 mEq/L (ref 135–145)
Total Bilirubin: 0.6 mg/dL (ref 0.2–1.2)
Total Protein: 7.2 g/dL (ref 6.0–8.3)

## 2021-02-11 LAB — TSH: TSH: 1.31 u[IU]/mL (ref 0.35–5.50)

## 2021-02-11 LAB — HEMOGLOBIN A1C: Hgb A1c MFr Bld: 5.8 % (ref 4.6–6.5)

## 2021-02-11 LAB — LIPID PANEL
Cholesterol: 156 mg/dL (ref 0–200)
HDL: 35.7 mg/dL — ABNORMAL LOW (ref >39.00)
LDL Cholesterol: 98 mg/dL (ref 0–99)
NonHDL: 120.53
Total CHOL/HDL Ratio: 4
Triglycerides: 114 mg/dL (ref 0.0–149.0)
VLDL: 22.8 mg/dL (ref 0.0–40.0)

## 2021-02-11 LAB — CBC
HCT: 39.2 % (ref 36.0–46.0)
Hemoglobin: 12.8 g/dL (ref 12.0–15.0)
MCHC: 32.6 g/dL (ref 30.0–36.0)
MCV: 86.4 fl (ref 78.0–100.0)
Platelets: 318 10*3/uL (ref 150.0–400.0)
RBC: 4.54 Mil/uL (ref 3.87–5.11)
RDW: 15.2 % (ref 11.5–15.5)
WBC: 5.8 10*3/uL (ref 4.0–10.5)

## 2021-02-11 MED ORDER — LISINOPRIL-HYDROCHLOROTHIAZIDE 20-12.5 MG PO TABS: 1.0000 | ORAL_TABLET | Freq: Every day | ORAL | 3 refills | Status: DC

## 2021-02-11 NOTE — Assessment & Plan Note (Signed)
Gets unilateral mammogram yearly.

## 2021-02-11 NOTE — Assessment & Plan Note (Signed)
BMI 36 and complicated by hypertension and hyperlipidemia.

## 2021-02-11 NOTE — Assessment & Plan Note (Signed)
Checking TSH and adjust synthroid 100 mcg daily as needed.  

## 2021-02-11 NOTE — Assessment & Plan Note (Signed)
Flu shot declines. Covid-19 booster counseled. Shingrix counseled declines. Tetanus due 2029. Cologuard ordered as due. Mammogram due 2023, pap smear up to date due 2023. Counseled about sun safety and mole surveillance. Counseled about the dangers of distracted driving. Given 10 year screening recommendations.

## 2021-02-11 NOTE — Assessment & Plan Note (Signed)
Checking lipid panel. Not on medications. Adjust as needed.

## 2021-02-11 NOTE — Patient Instructions (Addendum)
We will do the labs today and get the cologuard out to your house.  Think about the covid-19 booster shot and the shingles vaccine.

## 2021-02-11 NOTE — Progress Notes (Signed)
   Subjective:   Patient ID: Jacqueline Berry, female    DOB: 1960/10/19, 60 y.o.   MRN: [id number]  HPI The patient is a 60 YO female coming in for physical.  PMH, [place], social history reviewed and updated  Review of Systems  Constitutional: Negative.   HENT: Negative.    Eyes: Negative.   Respiratory:  Negative for cough, chest tightness and shortness of breath.   Cardiovascular:  Negative for chest pain, palpitations and leg swelling.  Gastrointestinal:  Negative for abdominal distention, abdominal pain, constipation, diarrhea, nausea and vomiting.  Musculoskeletal: Negative.   Skin: Negative.   Neurological: Negative.   Psychiatric/Behavioral: Negative.     Objective:  Physical Exam Constitutional:      Appearance: She is well-developed.  HENT:     Head: Normocephalic and atraumatic.  Cardiovascular:     Rate and Rhythm: Normal rate and regular rhythm.  Pulmonary:     Effort: Pulmonary effort is normal. No respiratory distress.     Breath sounds: Normal breath sounds. No wheezing or rales.  Abdominal:     General: Bowel sounds are normal. There is no distension.     Palpations: Abdomen is soft.     Tenderness: There is no abdominal tenderness. There is no rebound.  Musculoskeletal:     Cervical back: Normal range of motion.  Skin:    General: Skin is warm and dry.  Neurological:     Mental Status: She is alert and oriented to person, place, and time.     Coordination: Coordination normal.    Vitals:   02/11/21 1057  BP: 134/86  Pulse: 82  Resp: 18  SpO2: 97%  Weight: 206 lb 3.2 oz (93.5 kg)  Height: 5\' 3"  (1.6 m)    This visit occurred during the SARS-CoV-2 public health emergency.  Safety protocols were in place, including screening questions prior to the visit, additional usage of staff PPE, and extensive cleaning of exam room while observing appropriate contact time as indicated for disinfecting solutions.   Assessment & Plan:

## 2021-02-13 ENCOUNTER — Encounter: Payer: Medicare Other | Admitting: Rehabilitative and Restorative Service Providers"

## 2021-02-26 ENCOUNTER — Encounter: Payer: Self-pay | Admitting: Rehabilitative and Restorative Service Providers"

## 2021-02-26 ENCOUNTER — Other Ambulatory Visit: Payer: Self-pay

## 2021-02-26 ENCOUNTER — Ambulatory Visit (INDEPENDENT_AMBULATORY_CARE_PROVIDER_SITE_OTHER): Payer: Medicare Other | Admitting: Rehabilitative and Restorative Service Providers"

## 2021-02-26 ENCOUNTER — Other Ambulatory Visit: Payer: Self-pay | Admitting: Internal Medicine

## 2021-02-26 DIAGNOSIS — R600 Localized edema: Secondary | ICD-10-CM | POA: Diagnosis not present

## 2021-02-26 DIAGNOSIS — M6281 Muscle weakness (generalized): Secondary | ICD-10-CM | POA: Diagnosis not present

## 2021-02-26 DIAGNOSIS — G8929 Other chronic pain: Secondary | ICD-10-CM

## 2021-02-26 DIAGNOSIS — E039 Hypothyroidism, unspecified: Secondary | ICD-10-CM

## 2021-02-26 DIAGNOSIS — R262 Difficulty in walking, not elsewhere classified: Secondary | ICD-10-CM

## 2021-02-26 DIAGNOSIS — M25562 Pain in left knee: Secondary | ICD-10-CM | POA: Diagnosis not present

## 2021-02-26 NOTE — Therapy (Addendum)
[place], [place], [address] Phone: [phone]   Fax:  [phone]  Physical Therapy Treatment  /Discharge   Patient Details  Name: Jacqueline Berry MRN: [id number] Date of Birth: 1961-02-07 Referring Provider (PT): Leandrew Koyanagi, MD   Encounter Date: 02/26/2021   PT End of Session - 02/26/21 1625     Visit Number 4    Number of Visits 20    Date for PT Re-Evaluation 03/24/21    Authorization Type UHC Medicare $30 co pay    PT Start Time 1423    PT Stop Time 1510    PT Time Calculation (min) 47 min    Activity Tolerance Patient tolerated treatment well;No increased pain    Behavior During Therapy WFL for tasks assessed/performed             Past Medical History:  Diagnosis Date   Bipolar disorder ([place])    Breast cancer ([place])    with chemo - 1997   Hypertension    Syphilis    Thyroid disease    Thyroid disease     Past Surgical History:  Procedure Laterality Date   BREAST BIOPSY  1997   MASTECTOMY     left breat mastectomy [place]    There were no vitals filed for this visit.   Subjective Assessment - 02/26/21 1429     Subjective Inconsistent HEP over the past several weeks continues.    Limitations Standing;Walking;House hold activities;Lifting;Sitting    How long can you sit comfortably? Start-up stiffness    How long can you stand comfortably? 30 minutes    How long can you walk comfortably? 30 minutes    Patient Stated Goals Reduce pain, walking better    Currently in Pain? Yes    Pain Score 3     Pain Location Knee    Pain Orientation Left    Pain Descriptors / Indicators Aching;Tightness    Pain Type Surgical pain    Pain Onset More than a month ago    Pain Frequency Intermittent    Aggravating Factors  Prolonged sitting and in the mornings with start-up stiffness.    Pain Relieving Factors Change of position or rest.    Effect of Pain on Daily Activities  Limited WB function and endurance.    Multiple Pain Sites No                               [facility] Adult PT Treatment/Exercise - 02/26/21 0001       Neuro Re-ed    Neuro Re-ed Details  Single leg stance (very limited, 3 seconds) and tandem balance 5X 20 seconds each foot in front      Exercises   Exercises Knee/Hip      Knee/Hip Exercises: Aerobic   Stationary Bike Seat 4 for 8 minutes Level 3      Knee/Hip Exercises: Machines for Strengthening   Cybex Leg Press 100# DL and 50# single leg slow eccentrics 15X each      Knee/Hip Exercises: Seated   Long Arc Quad Strengthening;Both;3 sets;5 reps    Long Arc Quad Limitations Seated straight leg raises (toes back, press knee down, tighten thigh, hold 3 seconds, lift heel 6-8 inches and slowly lower while keeping knee straight)    Sit to Sand --   slow eccentrics     Knee/Hip Exercises:  Supine   Other Supine Knee/Hip Exercises Heel slides 10X 5 seconds                     PT Education - 02/26/21 1623     Education Details Reviewed HEP with emphasis remaining on quadriceps strength, balance and endurance.    Person(s) Educated Patient    Methods Explanation;Handout;Demonstration;Tactile cues;Verbal cues    Comprehension Verbal cues required;Need further instruction;Returned demonstration;Verbalized understanding;Tactile cues required              PT Short Term Goals - 01/13/21 1508       PT SHORT TERM GOAL #1   Title Patient will demonstrate independent use of home exercise program to maintain progress from in clinic treatments.    Time 3    Period Weeks    Status New    Target Date 02/03/21               PT Long Term Goals - 01/13/21 1509       PT LONG TERM GOAL #1   Title Patient will demonstrate/report pain at worst less than or equal to 2/10 to facilitate minimal limitation in daily activity secondary to pain symptoms.    Time 10    Period Weeks    Status New    Target Date  03/24/21      PT LONG TERM GOAL #2   Title Patient will demonstrate independent use of home exercise program to facilitate ability to maintain/progress functional gains from skilled physical therapy services.    Time 10    Period Weeks    Status New    Target Date 03/24/21      PT LONG TERM GOAL #3   Title Pt. will demonstrate FOTO outcome > or = 61% to indicated reduced disability due to condition.    Time 10    Period Weeks    Status New    Target Date 03/24/21      PT LONG TERM GOAL #4   Title Patient will demonstrate Lt LE MMT 5/5 throughout to facilitate ability to perform usual standing, walking, stairs at PLOF s limitation due to symptoms.    Time 10    Period Weeks    Status New    Target Date 03/24/21      PT LONG TERM GOAL #5   Title Patient will demonstrate Lt  knee AROM 0-110 degrees to facilitate ability to perform transfers, sitting, ambulation, stair navigation s restriction due to mobility.    Time 10    Period Weeks    Status New    Target Date 03/24/21                   Plan - 02/26/21 1625     Clinical Impression Statement Jacqueline Berry has not had good HEP compliance.  She may be weaker than she was at her last visit 3 weeks ago.  I reinforced the importance of continued HEP compliance, quadriceps strengthening and edema control to meet LTGs.  With the recommended compliance with her HEP, her prognosis is excellent.  With current HEP non-compliance and limited PT attendance, she will have a more difficult time meeting LTGs.    Examination-Activity Limitations Sit;Squat;Stairs;Stand;Carry;Lift;Reach Overhead    Examination-Participation Restrictions Community Activity;Occupation;Meal Prep;Laundry;Interpersonal Relationship    Stability/Clinical Decision Making Stable/Uncomplicated    Rehab Potential Good    PT Frequency --   1-2x/week   PT Duration Other (comment)   10 weeks  PT Treatment/Interventions ADLs/Self Care Home Management;Cryotherapy;Electrical  Stimulation;Iontophoresis 22m/ml Dexamethasone;Moist Heat;Balance training;Therapeutic exercise;Therapeutic activities;Functional mobility training;Stair training;Gait training;Patient/family education;DME Instruction;Ultrasound;Neuromuscular re-education;Passive range of motion;Spinal Manipulations;Joint Manipulations;Dry needling;Taping;Vasopneumatic Device;Manual techniques    PT Next Visit Plan Continued quadriceps strengthening (eccentrics emphasis), proprioception and balance.  Reinforce HEP participation and it's importance.    PT Home Exercise Plan [email or web address]    Consulted and Agree with Plan of Care Patient             Patient will benefit from skilled therapeutic intervention in order to improve the following deficits and impairments:  Abnormal gait, Hypomobility, Decreased activity tolerance, Decreased strength, Pain, Difficulty walking, Decreased mobility, Decreased balance, Decreased range of motion, Improper body mechanics, Impaired perceived functional ability, Impaired flexibility, Decreased coordination  Visit Diagnosis: Difficulty in walking, not elsewhere classified  Muscle weakness (generalized)  Localized edema  Chronic pain of left knee     Problem List Patient Active Problem List   Diagnosis Date Noted   Insufficiency fracture of femur ([place] 12/18/2020   Hyperlipidemia LDL goal <130 12/31/2019   Urinary incontinence 09/30/2017   Routine general medical examination at a health care facility 09/30/2017   Morbid obesity ([place] 10/16/2009   Hypothyroidism 07/11/2009   [place]oid [place] ([place] 06/24/2009   Essential hypertension 06/24/2009   HX, PERSONAL, MALIGNANCY, BREAST 12/07/2006    RFarley Ly PT, MPT 02/26/2021, 4:29 PM  PHYSICAL THERAPY DISCHARGE SUMMARY  Visits from Start of Care: 4  Current functional level related to goals / functional outcomes: See note   Remaining deficits: See note   Education / Equipment: HEP    Patient agrees to discharge. Patient goals were partially met. Patient is being discharged due to not returning since the last visit.  MScot Jun PT, DPT, OCS, ATC 04/08/21  3:24 PM     [facility] [address] [place] [address]Phone: [phone]  Fax:  [phone] Name: Jacqueline BEISSELMRN: [id number] of Birth: 801-18-1962

## 2021-02-26 NOTE — Patient Instructions (Signed)
Access Code: [email or web address] URL: [email or web address] Date: 02/26/2021 Prepared by: Vista Mink  Exercises Supine Heel Slide (Mirrored) - 2 x daily - 7 x weekly - 3 sets - 10 reps - 2 hold Seated Long Arc Quad (Mirrored) - 2 x daily - 7 x weekly - 3 sets - 10 reps - 2 hold Sit to Stand - 2-3 x daily - 7 x weekly - 1 sets - 10 reps Tandem Stance - 2 x daily - 7 x weekly - 1 sets - 5 reps - 20 second hold Small Range Straight Leg Raise - 2-3 x daily - 7 x weekly - 3-5 sets - 5 reps - 3 seconds hold

## 2021-03-02 ENCOUNTER — Telehealth: Payer: Self-pay | Admitting: Internal Medicine

## 2021-03-02 NOTE — Telephone Encounter (Signed)
Jacqueline Berry calling in reference lab results from 10.26.22. Jacqueline Berry is asking if Dr. Sharlet Salina is going to put her on cholesterol med  Please call the Jacqueline Berry at [phone]

## 2021-03-02 NOTE — Telephone Encounter (Signed)
See below

## 2021-03-03 DIAGNOSIS — Z1211 Encounter for screening for malignant neoplasm of colon: Secondary | ICD-10-CM | POA: Diagnosis not present

## 2021-03-03 NOTE — Telephone Encounter (Signed)
For some reason her labs do not have comment. They are normal and I do not think she needs cholesterol medicine at this time. The sugars and thyroid are also normal.

## 2021-03-09 ENCOUNTER — Other Ambulatory Visit: Payer: Self-pay | Admitting: Internal Medicine

## 2021-03-09 DIAGNOSIS — R195 Other fecal abnormalities: Secondary | ICD-10-CM

## 2021-03-09 LAB — COLOGUARD: COLOGUARD: POSITIVE — AB

## 2021-03-16 ENCOUNTER — Telehealth: Payer: Self-pay | Admitting: Internal Medicine

## 2021-03-16 NOTE — Telephone Encounter (Signed)
Patient requesting a call back to discuss next steps to positive cologuard test

## 2021-03-17 NOTE — Telephone Encounter (Signed)
Called pt. LDVM letting her know that Dr. Sharlet Salina has placed a referral to GI for colonoscopy to be done. Office number was provided in case she has any additional questions.

## 2021-03-18 ENCOUNTER — Encounter: Payer: Self-pay | Admitting: Gastroenterology

## 2021-04-07 ENCOUNTER — Ambulatory Visit (INDEPENDENT_AMBULATORY_CARE_PROVIDER_SITE_OTHER): Payer: Medicare Other | Admitting: Orthopaedic Surgery

## 2021-04-07 ENCOUNTER — Encounter: Payer: Self-pay | Admitting: Orthopaedic Surgery

## 2021-04-07 DIAGNOSIS — M1712 Unilateral primary osteoarthritis, left knee: Secondary | ICD-10-CM | POA: Diagnosis not present

## 2021-04-07 NOTE — Progress Notes (Signed)
Office Visit Note   Patient: Jacqueline Berry           Date of Birth: November 06, 1960           MRN: [id number] Visit Date: 04/07/2021              Requested by: Hoyt Koch, MD [address] [place],  [place] [address] PCP: Hoyt Koch, MD   Assessment & Plan: Visit Diagnoses:  1. Unilateral primary osteoarthritis, left knee     Plan: Impression is left knee osteoarthritis.  I believe the patient has had sufficient time to recover from her knee arthroscopy and that she is now dealing with the underlying arthritis.  We have discussed proceeding with intra-articular cortisone injection for which she is agreeable to.  She will follow-up with [place] as needed.  Call with concerns or questions.  Follow-Up Instructions: Return if symptoms worsen or fail to improve.   Orders:  Orders Placed This Encounter  Procedures   Large Joint Inj   No orders of the defined types were placed in this encounter.     Procedures: Large Joint Inj: L knee on 04/07/2021 3:05 PM Indications: pain Details: 22 G needle, anterolateral approach Medications: 2 mL lidocaine 1 %; 2 mL bupivacaine 0.25 %; 40 mg methylPREDNISolone acetate 40 MG/ML     Clinical Data: No additional findings.   Subjective: Chief Complaint  Patient presents with   Left Knee - Pain    HPI patient is a pleasant 60 year old female who comes in with left knee pain.  She is status post left knee arthroscopic debridement medial meniscus and chondroplasty 12/25/2020.  It was noted during operative mention that she had grade 3 changes to the medial femoral condyle.  She has been doing okay since surgery, but has continued to have achiness primarily medial knee.  Pain is worse with walking.  She has been taking Tylenol without significant relief.  Review of Systems as detailed in HPI.  All others reviewed and are negative.   Objective: Vital Signs: LMP 02/18/2011   Physical Exam well-developed well-nourished  female no acute distress.  Alert and oriented x3.  Ortho Exam left knee exam shows trace effusion.  Range of motion 0 to 100 degrees.  Medial joint line tenderness.  Moderate patellofemoral crepitus.  She is neurovascular tact distally.  Specialty Comments:  No specialty comments available.  Imaging: No new imaging   PMFS History: Patient Active Problem List   Diagnosis Date Noted   Insufficiency fracture of femur ([place]) 12/18/2020   Hyperlipidemia LDL goal <130 12/31/2019   Urinary incontinence 09/30/2017   Routine general medical examination at a health care facility 09/30/2017   Morbid obesity ([place]) 10/16/2009   Hypothyroidism 07/11/2009   Paranoid schizophrenia in remission ([place]) 06/24/2009   Essential hypertension 06/24/2009   HX, PERSONAL, MALIGNANCY, BREAST 12/07/2006   Past Medical History:  Diagnosis Date   Bipolar disorder ([place])    Breast cancer ([place])    with chemo - 1997   Hypertension    Syphilis    Thyroid disease    Thyroid disease     Family History  Problem Relation Age of Onset   Stroke Father    Diabetes Brother    Coronary artery disease Brother    Cancer Mother        ? type   Breast cancer Sister        74's    Past Surgical History:  Procedure Laterality Date  BREAST BIOPSY  1997   MASTECTOMY     left breat mastectomy 1997   TUBAL LIGATION     1992   Social History   Occupational History   Not on file  Tobacco Use   Smoking status: Former    Packs/day: 0.50    Years: 37.00    Pack years: 18.50    Types: Cigarettes   Smokeless tobacco: Never  Vaping Use   Vaping Use: Former  Substance and Sexual Activity   Alcohol use: No    Comment: Pt denies   Drug use: No    Comment: Pt denies    Sexual activity: Yes    Birth control/protection: Surgical    Comment: 1st intercourse 60 yo-More than 5 partners

## 2021-04-09 ENCOUNTER — Telehealth: Payer: Self-pay | Admitting: Orthopaedic Surgery

## 2021-04-09 MED ORDER — LIDOCAINE HCL 1 % IJ SOLN
2.0000 mL | INTRAMUSCULAR | Status: AC | PRN
  Administered 2021-04-07: 15:00:00 2 mL

## 2021-04-09 MED ORDER — BUPIVACAINE HCL 0.25 % IJ SOLN
2.0000 mL | INTRAMUSCULAR | Status: AC | PRN
  Administered 2021-04-07: 15:00:00 2 mL via INTRA_ARTICULAR

## 2021-04-09 MED ORDER — METHYLPREDNISOLONE ACETATE 40 MG/ML IJ SUSP
40.0000 mg | INTRAMUSCULAR | Status: AC | PRN
  Administered 2021-04-07: 15:00:00 40 mg via INTRA_ARTICULAR

## 2021-04-09 NOTE — Telephone Encounter (Signed)
Pt called she has questions about an injection that she stated she received last visit. Please call pt at [phone].

## 2021-04-10 NOTE — Telephone Encounter (Signed)
Sent mychart msg.

## 2021-04-14 ENCOUNTER — Telehealth: Payer: Self-pay | Admitting: Orthopaedic Surgery

## 2021-04-14 NOTE — Telephone Encounter (Signed)
Pt called stating she has some questions and concerns about the cortisone inj she got in her knee on 04/07/21. Pt would like a CB please.   [phone]

## 2021-04-15 ENCOUNTER — Telehealth: Payer: Self-pay | Admitting: Orthopaedic Surgery

## 2021-04-15 NOTE — Telephone Encounter (Signed)
Called patient. She states she is still having pain. Would like to know what to do. Advised her sometimes inj takes up to 2 weeks to fully kick in. Give it more time. Rest, ice elevate, and take OTC meds. If not any better to call us back.

## 2021-04-15 NOTE — Telephone Encounter (Signed)
Patient requesting call back from [place]. She has questions to ask.Please advise.

## 2021-04-21 ENCOUNTER — Telehealth: Payer: Self-pay | Admitting: Orthopaedic Surgery

## 2021-04-21 NOTE — Telephone Encounter (Signed)
Patient called advised the injection did not work and she can not walk without her cane because it's very painful to stand for very long. Patient asked if she can get the gel injection? The number to contact patient is [phone]

## 2021-04-22 NOTE — Telephone Encounter (Signed)
Noted  

## 2021-04-23 ENCOUNTER — Other Ambulatory Visit: Payer: Self-pay | Admitting: Internal Medicine

## 2021-04-23 ENCOUNTER — Telehealth: Payer: Self-pay

## 2021-04-23 NOTE — Progress Notes (Signed)
° ° °  Chronic Care Management Pharmacy Assistant   Name: Jacqueline Berry  MRN: [id number] DOB: 11-07-1960   Reason for Encounter: Disease State-General    Recent office visits:  02/11/21 Hoyt Koch, MD-PCP (Routine General Medical Exam) Labs ordered, No med changes  Recent consult visits:  04/07/21 Leandrew Koyanagi, MD-Orthopedic Surgery (Left Knee pain) orders: Large joint Inj: L knee, No med changes  01/15/21 Leandrew Koyanagi, MD-Orthopedic Surgery (Left Knee pain) No orders or med changes  01/02/28 Aundra Dubin, PA-C-Orthopedic Surgery (S/P arthroscopy of left knee) No orders or med changes  12/18/20 Leandrew Koyanagi, MD-Orthopedic Surgery (Left Knee pain) No orders or med changes  Hospital visits:  Medication Reconciliation was completed by comparing discharge summary, patients EMR and Pharmacy list, and upon discussion with patient.  Admitted to the hospital on 10/23/20 due to Left knee pain. Discharge date was 10/23/20. Discharged from [facility] Dept.  Medications that remain the same after Hospital Discharge:??  -All other medications will remain the same.    Medications: Outpatient Encounter Medications as of 04/23/2021  Medication Sig   amLODipine (NORVASC) 10 MG tablet TAKE 1 TABLET BY MOUTH EVERY DAY IN THE MORNING   benztropine (COGENTIN) 0.5 MG tablet Take 0.5 mg by mouth daily.    haloperidol (HALDOL) 5 MG tablet Take 2.5 mg by mouth daily.    haloperidol decanoate (HALDOL DECANOATE) 50 MG/ML injection Inject 100 mg into the muscle every 28 (twenty-eight) days.   HYDROcodone-acetaminophen (NORCO) 5-325 MG tablet Take 1 tablet by mouth 3 (three) times daily as needed. (Patient not taking: Reported on 02/11/2021)   levothyroxine (SYNTHROID) 100 MCG tablet TAKE 1 TABLET BY MOUTH EVERY DAY   lisinopril-hydrochlorothiazide (ZESTORETIC) 20-12.5 MG tablet Take 1 tablet by mouth daily.   Multiple Vitamins-Minerals (MULTIVITAMIN WITH MINERALS)  tablet Take 1 tablet by mouth daily.   No facility-administered encounter medications on file as of 04/23/2021.   Have you had any problems recently with your health?Patient states that she is not doing to well. She states that she had knee surgery back in Sept, and her knee is not doing any better. She claims that she still has some discomfort and takes tylenol for the pain. She states that she will use heat and ice packs on her knee as well.  Have you had any problems with your pharmacy? Patient states that she does not have any problems with getting her medications or the cost of her medications from the pharmacy  What issues or side effects are you having with your medications? Patient states that she does not have any side effects from medications  What would you like me to pass along to [facility] for them to help you with? Patient states that she does not need any help with medications  What can we do to take care of you better? Patient states it would help her if she could get some assistance to help get a walker because she is having difficulty with walking on her left knee.  Care Gaps: Colonoscopy-NA Diabetic Foot Exam-NA Mammogram-06/15/19 Ophthalmology-NA Dexa Scan - NA Annual Well Visit -02/11/21  Micro albumin-NA Hemoglobin A1c- 02/11/21  Star Rating Drugs: Lisinopril-HCTZ 5-325 mg-last fill 02/11/21 90 ds  Ethelene Hal Clinical Pharmacist Assistant [phone]

## 2021-04-24 NOTE — Progress Notes (Signed)
° ° °  Spoke with patient and recommended she set up an appointment with Dr. Sharlet Salina for help in getting a walker. Patient states that she will call and set appointment up today.  Susan Moore Pharmacist Assistant [phone]

## 2021-04-25 ENCOUNTER — Other Ambulatory Visit: Payer: Self-pay

## 2021-04-25 ENCOUNTER — Emergency Department (HOSPITAL_COMMUNITY)
Admission: EM | Admit: 2021-04-25 | Discharge: 2021-04-25 | Disposition: A | Discharge status: Home / Self Care | Payer: Medicare Other | Attending: Emergency Medicine | Admitting: Emergency Medicine

## 2021-04-25 ENCOUNTER — Encounter (HOSPITAL_COMMUNITY): Payer: Self-pay | Admitting: *Deleted

## 2021-04-25 ENCOUNTER — Emergency Department (HOSPITAL_COMMUNITY): Payer: Medicare Other

## 2021-04-25 DIAGNOSIS — M25562 Pain in left knee: Secondary | ICD-10-CM

## 2021-04-25 DIAGNOSIS — G8929 Other chronic pain: Secondary | ICD-10-CM | POA: Diagnosis not present

## 2021-04-25 DIAGNOSIS — Z91040 Latex allergy status: Secondary | ICD-10-CM | POA: Diagnosis not present

## 2021-04-25 DIAGNOSIS — M25462 Effusion, left knee: Secondary | ICD-10-CM | POA: Diagnosis not present

## 2021-04-25 MED ORDER — METHOCARBAMOL 500 MG PO TABS: 500.0000 mg | ORAL_TABLET | Freq: Two times a day (BID) | ORAL | 0 refills | Status: DC

## 2021-04-25 NOTE — ED Provider Triage Note (Signed)
Emergency Medicine Provider Triage Evaluation Note  Jacqueline Berry , a 61 y.o. female  was evaluated in triage.  Pt complains of left knee pain. Patient currently being seen by orthopedic Dr. Erlinda Hong and receiving knee injections. Patient states that her last knee injection was done on 12/20 but did not relieve her pain. Patient states that she has diagnosis of osteoarthritis in this knee.   Review of Systems  Positive: Left knee pain Negative: Fevers, nausea, vomiting, diarrhea  Physical Exam  BP 137/89    Pulse 87    Temp 97.7 F (36.5 C) (Oral)    Resp 16    Ht 5\' 3"  (1.6 m)    Wt 93.9 kg    LMP 02/18/2011    SpO2 98%    BMI 36.67 kg/m  Gen:   Awake, no distress   Resp:  Normal effort  MSK:   Moves extremities without difficulty. Patient able to appropriately range knee both actively and passively. Patient denies any recent trauma to knee. Other:    Medical Decision Making  Medically screening exam initiated at 2:09 PM.  Appropriate orders placed.  ODELLA APPELHANS was informed that the remainder of the evaluation will be completed by another provider, this initial triage assessment does not replace that evaluation, and the importance of remaining in the ED until their evaluation is complete.     Azucena Cecil, PA-C 04/25/21 1414

## 2021-04-25 NOTE — Discharge Instructions (Addendum)
Please use Tylenol or ibuprofen for pain.  You may use 600 mg ibuprofen every 6 hours or 1000 mg of Tylenol every 6 hours.  You may choose to alternate between the 2.  This would be most effective.  Not to exceed 4 g of Tylenol within 24 hours.  Not to exceed 3200 mg ibuprofen 24 hours.  Continue to use your voltaren gel. You can use the muscle relaxant up to twice daily for breakthrough pain.  Follow-up with your orthopedic doctor for further evaluation, and discussion of possible surgical knee replacement or other neck steps for ongoing pain.  I also recommend that you wear compressive wrap, you may want to find one that does not cause you so much pain so that you can wear it consistently.

## 2021-04-25 NOTE — ED Provider Notes (Signed)
[place] [facility] DEPT Provider Note   CSN: [id number] Arrival date & time: 04/25/21  1343     History  Chief Complaint  Patient presents with   Knee Pain    Jacqueline Berry is a 61 y.o. female past medical history of chronic left knee pain, who is status post arthroscopic evaluation of the knee in September, cortisone injection on December 20 who presents for acute on chronic worsening left knee pain.  Patient denies any recent new injury.  Patient reports her pain is worse throughout the day, worse with walking.  Patient reports that she is using Tylenol, as well as Voltaren gel, reports minimal relief from last cortisone injection.  Patient reports that she has had talks with her orthopedic surgeon about surgery but is looking for additional pain control and evaluation at this time.   Knee Pain     Home Medications Prior to Admission medications   Medication Sig Start Date End Date Taking? Authorizing Provider  methocarbamol (ROBAXIN) 500 MG tablet Take 1 tablet (500 mg total) by mouth 2 (two) times daily. 04/25/21  Yes Lemuel Boodram H, PA-C  amLODipine (NORVASC) 10 MG tablet TAKE 1 TABLET BY MOUTH EVERY DAY IN THE MORNING 04/24/21   Hoyt Koch, MD  benztropine (COGENTIN) 0.5 MG tablet Take 0.5 mg by mouth daily.     [provider]  haloperidol (HALDOL) 5 MG tablet Take 2.5 mg by mouth daily.     [provider]  haloperidol decanoate (HALDOL DECANOATE) 50 MG/ML injection Inject 100 mg into the muscle every 28 (twenty-eight) days.    [provider]  HYDROcodone-acetaminophen (NORCO) 5-325 MG tablet Take 1 tablet by mouth 3 (three) times daily as needed. Patient not taking: Reported on 02/11/2021 12/23/20   Aundra Dubin, PA-C  levothyroxine (SYNTHROID) 100 MCG tablet TAKE 1 TABLET BY MOUTH EVERY DAY 02/26/21   Hoyt Koch, MD  lisinopril-hydrochlorothiazide (ZESTORETIC) 20-12.5 MG tablet Take 1 tablet by  mouth daily. 02/11/21   Hoyt Koch, MD  Multiple Vitamins-Minerals (MULTIVITAMIN WITH MINERALS) tablet Take 1 tablet by mouth daily.    [provider]      Allergies    Latex    Review of Systems   Review of Systems  Musculoskeletal:  Positive for gait problem and joint swelling.  All other systems reviewed and are negative.  Physical Exam Updated Vital Signs BP 124/85    Pulse 82    Temp 97.7 F (36.5 C) (Oral)    Resp 17    Ht 5\' 3"  (1.6 m)    Wt 93.9 kg    LMP 02/18/2011    SpO2 98%    BMI 36.67 kg/m  Physical Exam Vitals and nursing note reviewed.  Constitutional:      General: She is not in acute distress.    Appearance: Normal appearance.  HENT:     Head: Normocephalic and atraumatic.  Eyes:     General:        Right eye: No discharge.        Left eye: No discharge.  Cardiovascular:     Rate and Rhythm: Normal rate and regular rhythm.     Pulses: Normal pulses.     Comments: Intact DP, PT pulses bilaterally Pulmonary:     Effort: Pulmonary effort is normal. No respiratory distress.  Musculoskeletal:        General: No deformity.     Comments: There is some diffuse swelling around the  left knee, there are are 2 well-healed scars from recent arthroscopic procedure, there is no evidence of redness overlying the skin surface.  Full passive and active range of motion with minimal pain.  There is some crepitus with range of motion.  No significant tenderness to palpation, patient endorses pain with weightbearing on the affected limb.  She has a minimally limping gait, but is able to weight-bear without difficulty.  Skin:    General: Skin is warm and dry.     Capillary Refill: Capillary refill takes less than 2 seconds.  Neurological:     Mental Status: She is alert and oriented to person, place, and time.  Psychiatric:        Mood and Affect: Mood normal.        Behavior: Behavior normal.    ED Results / Procedures / Treatments   Labs (all labs  ordered are listed, but only abnormal results are displayed) Labs Reviewed - No data to display  EKG None  Radiology DG Knee Complete 4 Views Left  Result Date: 04/25/2021 CLINICAL DATA:  Chronic knee pain EXAM: LEFT KNEE - COMPLETE 4+ VIEW COMPARISON:  10/23/2020 FINDINGS: There is a new, depressed subchondral fracture of the medial femoral condyle. No macroscopic fracture or dislocation of the left knee. Generally mild tricompartmental arthrosis, worst in the patellofemoral compartment. Small nonspecific knee joint effusion. Soft tissues are unremarkable. IMPRESSION: 1. There is a new, depressed subchondral fracture of the medial femoral condyle. 2. No macroscopic fracture or dislocation of the left knee. 3. Mild tricompartmental arthrosis. 4. Small, nonspecific knee joint effusion. Electronically Signed   By: Delanna Ahmadi M.D.   On: 04/25/2021 14:44    Procedures Procedures    Medications Ordered in ED Medications - No data to display  ED Course/ Medical Decision Making/ A&P                           Medical Decision Making  I discussed this case with my attending physician who cosigned this note including patient's presenting symptoms, physical exam, and planned diagnostics and interventions. Attending physician stated agreement with plan or made changes to plan which were implemented.   This is an overall well-appearing patient who has known chronic degenerative changes of the left knee who presents with acute worsening of her pain, increased difficulty walking.  My differential diagnosis includes acute on chronic degenerative changes, osteoarthritis.  With recent surgical procedure important to rule out acute joint effusion, hemarthrosis, septic arthritis, or new fracture.  Minimal concern for acute traumatic injury as patient does not recall any fall or trauma to the knee.  I personally ordered and reviewed radiographic imaging which shows a new depressed subchondral fracture of the  medial femoral condyle, I think this may be secondary to her recent arthroscopic procedure.  There is no macroscopic fracture or dislocation of the knee, tricompartmental arthrosis.  Small nonspecific joint effusion.  I agree with radiologist interpretation.  Patient's pain is tolerable, and she is able to ambulate, she has intact range of motion of the affected joint, no evidence of significant joint effusion, septic arthritis, or hemarthrosis.  The left lower extremity is neurovascular intact.  We will optimize patient's pain control with adding ibuprofen to her Tylenol regimen, will prescribe her muscle relaxant for additional breakthrough pain, encouraged follow-up with orthopedics for further discussion of possible surgery.  Patient understands and agrees to plan, discharged in stable condition at this time. Final Clinical  Impression(s) / ED Diagnoses Final diagnoses:  Acute pain of left knee    Rx / DC Orders ED Discharge Orders          Ordered    methocarbamol (ROBAXIN) 500 MG tablet  2 times daily        04/25/21 1808              Pailynn Vahey, Jackalyn Lombard 04/25/21 1910    Daleen Bo, MD 04/26/21 [phone]

## 2021-04-25 NOTE — ED Triage Notes (Signed)
Chronic left knee pain, Cortisone injection on Dec 20th, Pt had surgery September 2022

## 2021-04-27 ENCOUNTER — Telehealth: Payer: Self-pay

## 2021-04-27 NOTE — Telephone Encounter (Signed)
VOB submitted for Durolane, bilateral knee. BV pending. 

## 2021-04-28 ENCOUNTER — Telehealth: Payer: Self-pay

## 2021-04-28 NOTE — Telephone Encounter (Signed)
Approved for Durolane, left knee. [place] Patient will be responsible for 20% OOP. Co-pay of $30.00 No PA required  Appt. 1/17/2023n with Dr. Erlinda Hong

## 2021-05-04 ENCOUNTER — Encounter: Payer: Self-pay | Admitting: Internal Medicine

## 2021-05-05 ENCOUNTER — Ambulatory Visit: Payer: Medicare Other | Admitting: Orthopaedic Surgery

## 2021-05-05 ENCOUNTER — Other Ambulatory Visit: Payer: Self-pay

## 2021-05-05 DIAGNOSIS — M1712 Unilateral primary osteoarthritis, left knee: Secondary | ICD-10-CM | POA: Diagnosis not present

## 2021-05-05 MED ORDER — LIDOCAINE HCL 1 % IJ SOLN
2.0000 mL | INTRAMUSCULAR | Status: AC | PRN
  Administered 2021-05-05: 2 mL

## 2021-05-05 MED ORDER — BUPIVACAINE HCL 0.25 % IJ SOLN
2.0000 mL | INTRAMUSCULAR | Status: AC | PRN
  Administered 2021-05-05: 2 mL via INTRA_ARTICULAR

## 2021-05-05 MED ORDER — SODIUM HYALURONATE 60 MG/3ML IX PRSY
60.0000 mg | PREFILLED_SYRINGE | INTRA_ARTICULAR | Status: AC | PRN
  Administered 2021-05-05: 60 mg via INTRA_ARTICULAR

## 2021-05-05 NOTE — Progress Notes (Signed)
Office Visit Note   Patient: Jacqueline Berry           Date of Birth: [birth year]           MRN: [id number] Visit Date: 05/05/2021              Requested by: Hoyt Koch, MD [address] [place],  [place] [address] PCP: Hoyt Koch, MD   Assessment & Plan: Visit Diagnoses:  1. Unilateral primary osteoarthritis, left knee     Plan: Impression is left knee degenerative joint disease.  Today, we proceeded with left knee Durolane injection.  She tolerated this well.  She will follow-up with [place] as needed.  Call with concerns or questions in the meantime.  Follow-Up Instructions: Return if symptoms worsen or fail to improve.   Orders:  Orders Placed This Encounter  Procedures   Large Joint Inj: R knee   No orders of the defined types were placed in this encounter.     Procedures: Large Joint Inj: R knee on 05/05/2021 2:47 PM Indications: pain Details: 22 G needle, anterolateral approach Medications: 2 mL lidocaine 1 %; 2 mL bupivacaine 0.25 %; 60 mg Sodium Hyaluronate 60 MG/3ML     Clinical Data: No additional findings.   Subjective: Chief Complaint  Patient presents with   Left Knee - Pain    HPI patient is a pleasant 61 year old female with history of left knee degenerative joint disease who comes in today for Durolane injection to the left knee.  She has previously undergone viscosupplementation injections.  She has however had cortisone injections in the past without significant relief.     Objective: Vital Signs: LMP 02/18/2011     Ortho Exam left knee exam is unchanged  Specialty Comments:  No specialty comments available.  Imaging: No new imaging   PMFS History: Patient Active Problem List   Diagnosis Date Noted   Insufficiency fracture of femur ([place]) 12/18/2020   Hyperlipidemia LDL goal <130 12/31/2019   Urinary incontinence 09/30/2017   Routine general medical examination at a health care facility 09/30/2017   Morbid  obesity ([place]) 10/16/2009   Hypothyroidism 07/11/2009   Paranoid schizophrenia in remission ([place]) 06/24/2009   Essential hypertension 06/24/2009   HX, PERSONAL, MALIGNANCY, BREAST 12/07/2006   Past Medical History:  Diagnosis Date   Bipolar disorder ([place])    Breast cancer ([place])    with chemo - 1997   Hypertension    Syphilis    Thyroid disease    Thyroid disease     Family History  Problem Relation Age of Onset   Stroke Father    Diabetes Brother    Coronary artery disease Brother    Cancer Mother        ? type   Breast cancer Sister        60's    Past Surgical History:  Procedure Laterality Date   BREAST BIOPSY  1997   MASTECTOMY     left breat mastectomy [place]   Social History   Occupational History   Not on file  Tobacco Use   Smoking status: Former    Packs/day: 0.50    Years: 37.00    Pack years: 18.50    Types: Cigarettes   Smokeless tobacco: Never  Vaping Use   Vaping Use: Former  Substance and Sexual Activity   Alcohol use: No    Comment: Pt denies   Drug use: No  Comment: Pt denies    Sexual activity: Yes    Birth control/protection: Surgical    Comment: 1st intercourse 61 yo-More than 5 partners

## 2021-05-18 ENCOUNTER — Telehealth: Payer: Self-pay | Admitting: Orthopaedic Surgery

## 2021-05-18 NOTE — Telephone Encounter (Signed)
Received medical records release form from patient  

## 2021-05-19 ENCOUNTER — Encounter: Payer: Medicare Other | Admitting: Gastroenterology

## 2021-05-27 ENCOUNTER — Telehealth: Payer: Self-pay | Admitting: Internal Medicine

## 2021-05-27 NOTE — Telephone Encounter (Signed)
N/A unable to leave a message for patient to call back to schedule Medicare Annual Wellness Visit   Last AWV  06/03/20  Please schedule at anytime with [facility] [place] if patient calls the office back.    40 Minutes appointment   Any questions, please call me at [phone]

## 2021-05-28 ENCOUNTER — Encounter: Payer: Self-pay | Admitting: Gastroenterology

## 2021-05-28 NOTE — Telephone Encounter (Signed)
Pt states she will schedule AWV at a later date

## 2021-06-03 ENCOUNTER — Ambulatory Visit: Payer: Medicare Other | Admitting: Internal Medicine

## 2021-06-04 ENCOUNTER — Other Ambulatory Visit: Payer: Self-pay | Admitting: Internal Medicine

## 2021-06-04 DIAGNOSIS — E039 Hypothyroidism, unspecified: Secondary | ICD-10-CM

## 2021-06-10 ENCOUNTER — Telehealth: Payer: Medicare Other

## 2021-06-11 ENCOUNTER — Ambulatory Visit: Payer: Medicare Other | Admitting: Gastroenterology

## 2021-06-11 ENCOUNTER — Encounter: Payer: Self-pay | Admitting: Gastroenterology

## 2021-06-11 VITALS — BP 110/70 | HR 97 | Ht 63.0 in | Wt 203.0 lb

## 2021-06-11 DIAGNOSIS — R195 Other fecal abnormalities: Secondary | ICD-10-CM | POA: Insufficient documentation

## 2021-06-11 MED ORDER — NA SULFATE-K SULFATE-MG SULF 17.5-3.13-1.6 GM/177ML PO SOLN: 1.0000 | Freq: Once | ORAL | 0 refills | Status: AC

## 2021-06-11 NOTE — Patient Instructions (Signed)
If you are age 62 or younger, your body mass index should be between 19-25. Your Body mass index is 35.96 kg/m. If this is out of the aformentioned range listed, please consider follow up with your Primary Care Provider.  ________________________________________________________  The [facility] providers would like to encourage you to use [facility] to communicate with providers for non-urgent requests or questions.  Due to long hold times on the telephone, sending your provider a message by [facility] may be a faster and more efficient way to get a response.  Please allow 48 business hours for a response.  Please remember that this is for non-urgent requests.  _______________________________________________________  Jacqueline Berry have been scheduled for a colonoscopy. Please follow written instructions given to you at your visit today.  Please pick up your prep supplies at the pharmacy within the next 1-3 days. If you use inhalers (even only as needed), please bring them with you on the day of your procedure.  Follow up pending the results of your Colonoscopy.  Thank you for entrusting me with your care and choosing [facility].  Alonza Bogus, PA-C

## 2021-06-11 NOTE — Progress Notes (Signed)
9/67/8938 Jacqueline Berry [id number] 1960-12-25   HISTORY OF PRESENT ILLNESS: This is a 61 year old female who is new to our office.  She has been referred here by Dr. Sharlet Salina in order to discuss colonoscopy for positive Cologuard study.  She says that she had a colonoscopy about 15 or so years ago that was normal.  Then 3 years ago she had a Cologuard study that was negative, but most recently the one that she performed came back with a positive result.  She denies any rectal bleeding.  No dark stools.  She says that she moves her bowels well without issues.  No known family history of colon cancer.  Hgb is normal, no anemia.   Past Medical History:  Diagnosis Date   Bipolar disorder ([place])    Breast cancer ([place])    with chemo - 1997   Hypertension    Syphilis    Thyroid disease    Thyroid disease    Past Surgical History:  Procedure Laterality Date   BREAST BIOPSY  1997   MASTECTOMY     left breat mastectomy [place]    reports that she has quit smoking. Her smoking use included cigarettes. She has a 18.50 pack-year smoking history. She has never used smokeless tobacco. She reports that she does not drink alcohol and does not use drugs. family history includes Breast cancer in her sister; Cancer in her mother; Coronary artery disease in her brother; Diabetes in her brother; Stroke in her father. Allergies  Allergen Reactions   Latex     REACTION: itchiong      Outpatient Encounter Medications as of 06/11/2021  Medication Sig   amLODipine (NORVASC) 10 MG tablet TAKE 1 TABLET BY MOUTH EVERY DAY IN THE MORNING   benztropine (COGENTIN) 0.5 MG tablet Take 0.5 mg by mouth daily.    haloperidol (HALDOL) 5 MG tablet Take 2.5 mg by mouth daily.    haloperidol decanoate (HALDOL DECANOATE) 50 MG/ML injection Inject 100 mg into the muscle every 28 (twenty-eight) days.   HYDROcodone-acetaminophen (NORCO) 5-325 MG tablet Take 1 tablet by mouth 3 (three) times  daily as needed.   levothyroxine (SYNTHROID) 100 MCG tablet TAKE 1 TABLET BY MOUTH EVERY DAY   lisinopril-hydrochlorothiazide (ZESTORETIC) 20-12.5 MG tablet Take 1 tablet by mouth daily.   methocarbamol (ROBAXIN) 500 MG tablet Take 1 tablet (500 mg total) by mouth 2 (two) times daily.   Multiple Vitamins-Minerals (MULTIVITAMIN WITH MINERALS) tablet Take 1 tablet by mouth daily.   No facility-administered encounter medications on file as of 06/11/2021.    REVIEW OF SYSTEMS  : All other systems reviewed and negative except where noted in the History of Present Illness.   PHYSICAL EXAM: BP 110/70    Pulse 97    Ht 5\' 3"  (1.6 m)    Wt 203 lb (92.1 kg)    LMP 02/18/2011    BMI 35.96 kg/m  General: Well developed AA female in no acute distress Head: Normocephalic and atraumatic Eyes:  Sclerae anicteric, conjunctiva pink. Ears: Normal auditory acuity Lungs: Clear throughout to auscultation; no W/R/R. Heart: Regular rate and rhythm; no M/R/G. Abdomen: Soft, non-distended. BS present.  Non-tender. Rectal:  Will be done at the time of colonoscopy. Musculoskeletal: Symmetrical with no gross deformities  Skin: No lesions on visible extremities Extremities: No edema  Neurological: Alert oriented x 4, grossly non-focal Psychological:  Alert and cooperative. Normal mood and affect  ASSESSMENT  AND PLAN: *Positive Cologuard: Had a negative Cologuard 3 years ago, but most recently was positive.  Denies any GI symptoms.  Reports colonoscopy probably 15 or so years ago that she says was normal.  No known family history of colon cancer.  We will plan for colonoscopy with Dr. Lorenso Courier.  The risks, benefits, and alternatives to colonoscopy were discussed with the patient and she consents to proceed.   CC:  Hoyt Koch, *

## 2021-06-12 NOTE — Progress Notes (Signed)
I agree with the assessment and plan as outlined by Jacqueline Berry. 

## 2021-06-24 ENCOUNTER — Telehealth: Payer: Medicare Other

## 2021-06-24 NOTE — Progress Notes (Unsigned)
Chronic Care Management Pharmacy Note  6/0/7371 Name:  Jacqueline Berry MRN:  [id number] DOB:  July 24, 1960  Summary: ***  Recommendations/Changes made from today's visit: ***  Plan: ***  Subjective: Jacqueline Berry is an 61 y.o. year old female who is a primary patient of Hoyt Koch, MD.  The CCM team was consulted for assistance with disease management and care coordination needs.    Engaged with patient by telephone for follow up visit in response to provider referral for pharmacy case management and/or care coordination services.   Consent to Services:  The patient was given the following information about Chronic Care Management services today, agreed to services, and gave verbal consent: 1. CCM service includes personalized support from designated clinical staff supervised by the primary care provider, including individualized plan of care and coordination with other care providers 2. 24/7 contact phone numbers for assistance for urgent and routine care needs. 3. Service will only be billed when office clinical staff spend 20 minutes or more in a month to coordinate care. 4. Only one practitioner may furnish and bill the service in a calendar month. 5.The patient may stop CCM services at any time (effective at the end of the month) by phone call to the office staff. 6. The patient will be responsible for cost sharing (co-pay) of up to 20% of the service fee (after annual deductible is met). Patient agreed to services and consent obtained.  Patient Care Team: Hoyt Koch, MD as PCP - General (Internal Medicine) Rogers Blocker, MD (Internal Medicine) Charlton Haws, [facility] as Pharmacist (Pharmacist)  Recent office visits: 02/11/2021 - Dr. Sharlet Salina - cologuard ordered - f/u in 1 year   Recent consult visits: 06/11/2021 - Janett Billow Zehr PA-C - [facility] - positive cologuard study - scheduled for colonoscopy  05/05/2021 - Dr. Erlinda Hong - Orthopedic Surgery - viscosup injection  of right knee given - continue follow up for monitoring of left knee pain  04/07/2021 - Dr. Erlinda Hong - Orthopedic surgery  - steroid injectio of left knee  9/29/202 - Dr. Erlinda Hong - Orthopedic surgery - f/u for arthroscopy of left knee - no changes to medications  01/01/2021 - Dwana Melena - PA-C - Orthopedic surgery - 1 week f/u of left knee arthroscopy procedure - no changes   Hospital visits: 04/25/2021 - ED visit - left knee pain  - IBU along w/ APAP for pain - rx'd methocarbamol as well   Objective:  Lab Results  Component Value Date   CREATININE 0.79 02/11/2021   BUN 9 02/11/2021   GFR 80.87 02/11/2021   GFRNONAA 80 12/31/2019   GFRAA 93 12/31/2019   NA 141 02/11/2021   K 3.3 (L) 02/11/2021   CALCIUM 9.3 02/11/2021   CO2 30 02/11/2021   GLUCOSE 143 (H) 02/11/2021    Lab Results  Component Value Date/Time   HGBA1C 5.8 02/11/2021 11:14 AM   HGBA1C 5.6 11/24/2018 03:37 PM   GFR 80.87 02/11/2021 11:14 AM   GFR 76.57 11/24/2018 03:37 PM   MICROALBUR 3.59 (H) 05/06/2010 09:12 PM    Last diabetic Eye exam:  No results found for: HMDIABEYEEXA  Last diabetic Foot exam:  No results found for: HMDIABFOOTEX   Lab Results  Component Value Date   CHOL 156 02/11/2021   HDL 35.70 (L) 02/11/2021   LDLCALC 98 02/11/2021   LDLDIRECT 97.0 09/29/2017   TRIG 114.0 02/11/2021   CHOLHDL 4 02/11/2021    Hepatic Function Latest Ref Rng & Units 02/11/2021  12/31/2019 11/24/2018  Total Protein 6.0 - 8.3 g/dL 7.2 7.3 7.2  Albumin 3.5 - 5.2 g/dL 4.0 - 4.1  AST 0 - 37 U/L _0 ALT 0 - 35 U/L _1 Alk Phosphatase 39 - 117 U/L 104 - 79  Total Bilirubin 0.2 - 1.2 mg/dL 0.6 0.6 0.5  Bilirubin, Direct 0.0 - 0.2 mg/dL - 0.2 -    Lab Results  Component Value Date/Time   TSH 1.31 02/11/2021 11:14 AM   TSH 2.67 12/31/2019 08:24 AM   FREET4 1.12 11/24/2018 03:37 PM   FREET4 1.12 09/29/2017 04:07 PM    CBC Latest Ref Rng & Units 02/11/2021 12/31/2019 11/24/2018  WBC 4.0 - 10.5 K/uL 5.8 6.3 7.0   Hemoglobin 12.0 - 15.0 g/dL 12.8 13.0 12.1  Hematocrit 36.0 - 46.0 % 39.2 41.0 36.9  Platelets 150.0 - 400.0 K/uL 318.0 285 275.0    Lab Results  Component Value Date/Time   VD25OH 16.42 (L) 11/24/2018 03:37 PM    Clinical ASCVD: {YES/NO:21197} The 10-year ASCVD risk score (Arnett DK, et al., 2019) is: 4.7%   Values used to calculate the score:     Age: 54 years     Sex: Female     Is Non-Hispanic African American: Yes     Diabetic: No     Tobacco smoker: No     Systolic Blood Pressure: 315 mmHg     Is BP treated: Yes     HDL Cholesterol: 35.7 mg/dL     Total Cholesterol: 156 mg/dL    Depression screen [facility] 2/9 06/03/2020 03/03/2018 09/29/2017  Decreased Interest 1 0 0  Down, Depressed, Hopeless 1 0 0  PHQ - 2 Score 2 0 0     ***Other: (CHADS2VASc if Afib, MMRC or CAT for COPD, ACT, DEXA)  Social History   Tobacco Use  Smoking Status Former   Packs/day: 0.50   Years: 37.00   Pack years: 18.50   Types: Cigarettes  Smokeless Tobacco Never   BP Readings from Last 3 Encounters:  06/11/21 110/70  04/25/21 124/85  02/11/21 134/86   Pulse Readings from Last 3 Encounters:  06/11/21 97  04/25/21 82  02/11/21 82   Wt Readings from Last 3 Encounters:  06/11/21 203 lb (92.1 kg)  04/25/21 207 lb (93.9 kg)  02/11/21 206 lb 3.2 oz (93.5 kg)   BMI Readings from Last 3 Encounters:  06/11/21 35.96 kg/m  04/25/21 36.67 kg/m  02/11/21 36.53 kg/m    Assessment/Interventions: Review of patient past medical history, allergies, medications, health status, including review of consultants reports, laboratory and other test data, was performed as part of comprehensive evaluation and provision of chronic care management services.   SDOH:  (Social Determinants of Health) assessments and interventions performed: {yes/no:20286}  SDOH Screenings   Alcohol Screen: Not on file  Depression (PHQ2-9): Not on file  Financial Resource Strain: Not on file  Food Insecurity: Not on  file  Housing: Not on file  Physical Activity: Not on file  Social Connections: Not on file  Stress: Not on file  Tobacco Use: Medium Risk   Smoking Tobacco Use: Former   Smokeless Tobacco Use: Never   Passive Exposure: Not on file  Transportation Needs: Not on file    [place]  Allergies  Allergen Reactions   Latex     REACTION: itchiong    Medications Reviewed Today     Reviewed by Loralie Champagne, PA-C (Physician Assistant) on 06/11/21 at  1611  Med List Status: <None>   Medication Order Taking? Sig Documenting Provider Last Dose Status Informant  amLODipine (NORVASC) 10 MG tablet [id number] Yes TAKE 1 TABLET BY MOUTH EVERY DAY IN THE MORNING Hoyt Koch, MD Taking Active   benztropine (COGENTIN) 0.5 MG tablet [id number] Yes Take 0.5 mg by mouth daily.  [provider] Taking Active Self  haloperidol (HALDOL) 5 MG tablet [id number] Yes Take 2.5 mg by mouth daily.  [provider] Taking Active Self  haloperidol decanoate (HALDOL DECANOATE) 50 MG/ML injection [id number] Yes Inject 100 mg into the muscle every 28 (twenty-eight) days. [provider] Taking Active Self  HYDROcodone-acetaminophen (NORCO) 5-325 MG tablet [id number] Yes Take 1 tablet by mouth 3 (three) times daily as needed. Aundra Dubin, PA-C Taking Active   levothyroxine (SYNTHROID) 100 MCG tablet [id number] Yes TAKE 1 TABLET BY MOUTH EVERY DAY Ailene Ards, NP Taking Active   lisinopril-hydrochlorothiazide (ZESTORETIC) 20-12.5 MG tablet [id number] Yes Take 1 tablet by mouth daily. Hoyt Koch, MD Taking Active   methocarbamol (ROBAXIN) 500 MG tablet [id number] Yes Take 1 tablet (500 mg total) by mouth 2 (two) times daily. Prosperi, Christian H, PA-C Taking Active   Multiple Vitamins-Minerals (MULTIVITAMIN WITH MINERALS) tablet [id number] Yes Take 1 tablet by mouth daily. [provider] Taking Active   Na Sulfate-K Sulfate-Mg Sulf 17.5-3.13-1.6 GM/177ML SOLN  [id number] Yes Take 1 kit by mouth once for 1 dose. Loralie Champagne, PA-C  Active             Patient Active Problem List   Diagnosis Date Noted   Positive colorectal cancer screening using Cologuard test 06/11/2021   Insufficiency fracture of femur ([place]) 12/18/2020   Hyperlipidemia LDL goal <130 12/31/2019   Urinary incontinence 09/30/2017   Routine general medical examination at a health care facility 09/30/2017   Morbid obesity ([place]) 10/16/2009   Hypothyroidism 07/11/2009   Paranoid schizophrenia in remission ([place]) 06/24/2009   Essential hypertension 06/24/2009   HX, PERSONAL, MALIGNANCY, BREAST 12/07/2006    Immunization History  Administered Date(s) Administered   PFIZER Comirnaty(Gray Top)Covid-19 Tri-Sucrose Vaccine 05/14/2020   PFIZER(Purple Top)SARS-COV-2 Vaccination 07/20/2019, 08/10/2019   Td 04/20/2007   Tdap 02/23/2018    Conditions to be addressed/monitored:  Hypertension and Hypothyroidism  There are no care plans that you recently modified to display for this patient.     Medication Assistance: None required.  Patient affirms current coverage meets needs.  Care Gaps: HIV screening Hep C screening PAP Smear Mammogram COVID booster Shingrix  Patient's preferred pharmacy is:  [id number]- [place], [place] - [address] [id number]: [phone]: [phone]  Uses pill box? No Pt endorses 100% compliance  Care Plan and Follow Up Patient Decision:  Patient agrees to Care Plan and Follow-up.  Plan: Telephone follow up appointment with care management team member scheduled for:  ***  ***  Current Barriers:  Chronic Disease Management support, education, and care coordination needs related to Hypertension and Health maintenance   Hypertension BP Readings from Last 3 Encounters:  06/11/21 110/70  04/25/21 124/85  02/11/21 134/86  Pharmacist  Clinical Goal(s): Patient will work with PharmD and providers to achieve BP goal <130/80 Current regimen:  amlodipine 10 mg daily,  lisinopril -hctz 20-12.58m daily  Interventions: Discussed BP goal and benefits of medications for prevention of heart attack /stroke Patient self care activities - Patient will: Check  BP 2-3 times weekly, document, and provide at future appointments Ensure daily salt intake < 2300 mg/day  Hypothyroidism Lab Results  Component Value Date   TSH 1.31 02/11/2021  Pharmacist Clinical Goal(s) Patient will work with PharmD and providers to optimize thyroid levels and maintain euthyroid levels  Current regimen:  Levothyroxine 131mg daily  Interventions: Discussed with patient proper administration of levothyroxine - separating from food/ other medications by at least 30 minutes  Patient self care activities -Patient will: Continue current medication  Pain/Stiffness Pharmacist Clinical Goal(s) Patient will work with PharmD and providers to optimize therapy Current regimen:  No medications Interventions: Discussed benefits of daily exercise and movement Patient self care activities Patient will: Partake in at least 20 minutes of physical activity daily  Medication management Pharmacist Clinical Goal(s): Patient will work with PharmD and providers to achieve optimal medication adherence Current pharmacy: [facility]  Interventions Comprehensive medication review performed. Utilize [facility] pharmacy for medication synchronization, packaging and delivery Patient self care activities -Patient will: Focus on medication adherence by pill pack Take medications as prescribed Report any questions or concerns to PharmD and/or provider(s)

## 2021-07-14 ENCOUNTER — Encounter: Payer: Medicare Other | Admitting: Internal Medicine

## 2021-07-14 ENCOUNTER — Ambulatory Visit: Payer: Medicare Other | Admitting: Orthopaedic Surgery

## 2021-07-22 ENCOUNTER — Telehealth: Payer: Self-pay | Admitting: Gastroenterology

## 2021-07-22 NOTE — Telephone Encounter (Signed)
Patient called requesting an alternative for her prep medication. ?

## 2021-07-27 NOTE — Telephone Encounter (Signed)
Left message informing her new prep instructions for her procedure I will send via Mychart for a prep with everything over the counter. ?

## 2021-07-27 NOTE — Telephone Encounter (Signed)
Calling back to check status  °

## 2021-07-28 ENCOUNTER — Encounter: Payer: Self-pay | Admitting: Internal Medicine

## 2021-07-30 ENCOUNTER — Telehealth: Payer: Self-pay

## 2021-07-30 NOTE — Progress Notes (Addendum)
? ? ?  Chronic Care Management ?Pharmacy Assistant  ? ?Name: Jacqueline Berry  MRN: [id number] DOB: [birth year] ? ?Reason for Encounter: Disease State-General ?  ? ?Recent office visits:  ?None since last coordination call ? ?Recent consult visits:  ?06/11/21 Zehr,Jessica PA-C-Gastroenterology (Positive cologuard) Orders: Ambulatory referral to Gastroenterology;Med changes: Na Sulfate-K Sulfate-Mg Sulf 17.5-3.13-1.6 GM/177ML SOLN ? ?05/05/21 Leandrew Koyanagi, MD-Orthopedic surgery (Left knee pain)No orders or med changes ? ?Hospital visits:  ?Medication Reconciliation was completed by comparing discharge summary, patient?s EMR and Pharmacy list, and upon discussion with patient. ? ?Admitted to the hospital on 04/25/21 due to Knee pain. Discharge date was 04/25/21. Discharged from [facility] ED   ? ? ?Medications that remain the same after Hospital Discharge:??  ?-All other medications will remain the same.   ? ?Medications: ?Outpatient Encounter Medications as of 07/30/2021  ?Medication Sig  ? amLODipine (NORVASC) 10 MG tablet TAKE 1 TABLET BY MOUTH EVERY DAY IN THE MORNING  ? benztropine (COGENTIN) 0.5 MG tablet Take 0.5 mg by mouth daily.   ? haloperidol (HALDOL) 5 MG tablet Take 2.5 mg by mouth daily.   ? haloperidol decanoate (HALDOL DECANOATE) 50 MG/ML injection Inject 100 mg into the muscle every 28 (twenty-eight) days.  ? HYDROcodone-acetaminophen (NORCO) 5-325 MG tablet Take 1 tablet by mouth 3 (three) times daily as needed.  ? levothyroxine (SYNTHROID) 100 MCG tablet TAKE 1 TABLET BY MOUTH EVERY DAY  ? lisinopril-hydrochlorothiazide (ZESTORETIC) 20-12.5 MG tablet Take 1 tablet by mouth daily.  ? methocarbamol (ROBAXIN) 500 MG tablet Take 1 tablet (500 mg total) by mouth 2 (two) times daily.  ? Multiple Vitamins-Minerals (MULTIVITAMIN WITH MINERALS) tablet Take 1 tablet by mouth daily.  ? ?No facility-administered encounter medications on file as of 07/30/2021.  ? ?Have you had any problems recently with  your health?Patient states that she is having issues with her knee. She had knee surgery and reports that right leg feels like it twisted and she is having trouble walking ? ?Have you had any problems with your pharmacy?Patient states that she does not have any problems with getting medications or the cost of medications from the pharmacy ? ?What issues or side effects are you having with your medications?Patient states that she does not have any side effects from medications ? ?What would you like me to pass along to [facility] for them to help you with? Patient states that she is is doing well beside having knee pain ? ?What can we do to take care of you better? Patient states that she does not need anything at this time ? ? ?Care Gaps: ?Colonoscopy-08/06/21 ?Diabetic Foot Exam-NA ?Mammogram-06/15/19 ?Ophthalmology-NA ?Dexa Scan - NA ?Annual Well Visit -02/11/21  ?Micro albumin-NA ?Hemoglobin A1c- 02/11/21 ?  ?Star Rating Drugs: ?Lisinopril-HCTZ 5-325 mg-last fill 05/08/21 90 ds ?  ?Ethelene Hal ?Clinical Pharmacist Assistant ?

## 2021-08-06 ENCOUNTER — Encounter: Payer: Self-pay | Admitting: Internal Medicine

## 2021-08-06 ENCOUNTER — Ambulatory Visit (AMBULATORY_SURGERY_CENTER): Payer: Medicare Other | Admitting: Internal Medicine

## 2021-08-06 ENCOUNTER — Other Ambulatory Visit: Payer: Self-pay | Admitting: Internal Medicine

## 2021-08-06 VITALS — BP 140/68 | HR 72 | Temp 97.7°F | Resp 18 | Ht 63.0 in | Wt 203.0 lb

## 2021-08-06 DIAGNOSIS — K648 Other hemorrhoids: Secondary | ICD-10-CM

## 2021-08-06 DIAGNOSIS — K5730 Diverticulosis of large intestine without perforation or abscess without bleeding: Secondary | ICD-10-CM | POA: Diagnosis not present

## 2021-08-06 DIAGNOSIS — R195 Other fecal abnormalities: Secondary | ICD-10-CM

## 2021-08-06 DIAGNOSIS — D122 Benign neoplasm of ascending colon: Secondary | ICD-10-CM

## 2021-08-06 DIAGNOSIS — Z1231 Encounter for screening mammogram for malignant neoplasm of breast: Secondary | ICD-10-CM

## 2021-08-06 DIAGNOSIS — D120 Benign neoplasm of cecum: Secondary | ICD-10-CM

## 2021-08-06 DIAGNOSIS — I10 Essential (primary) hypertension: Secondary | ICD-10-CM | POA: Diagnosis not present

## 2021-08-06 DIAGNOSIS — E039 Hypothyroidism, unspecified: Secondary | ICD-10-CM | POA: Diagnosis not present

## 2021-08-06 MED ORDER — SODIUM CHLORIDE 0.9 % IV SOLN: 500.0000 mL | Freq: Once | INTRAVENOUS | Status: DC

## 2021-08-06 NOTE — Progress Notes (Signed)
? ?GASTROENTEROLOGY PROCEDURE H&P NOTE  ? ?Primary Care Physician: ?Hoyt Koch, MD ? ? ? ?Reason for Procedure:   Positive Cologuard ? ?Plan:    Colonoscopy ? ?Patient is appropriate for endoscopic procedure(s) in the ambulatory ([place]) setting. ? ?The nature of the procedure, as well as the risks, benefits, and alternatives were carefully and thoroughly reviewed with the patient. Ample time for discussion and questions allowed. The patient understood, was satisfied, and agreed to proceed.  ? ? ? ?HPI: ?Jacqueline Berry is a 61 y.o. female who presents for colonoscopy for evaluation of positive Cologuard .  Patient was most recently seen in the Gastroenterology Clinic on 06/11/21.  No interval change in medical history since that appointment. Please refer to that note for full details regarding GI history and clinical presentation.  ? ?Past Medical History:  ?Diagnosis Date  ? Bipolar disorder ([place])   ? Breast cancer ([place])   ? with chemo - 1997  ? Hypertension   ? Syphilis   ? Thyroid disease   ? Thyroid disease   ? ? ?Past Surgical History:  ?Procedure Laterality Date  ? BREAST BIOPSY  1997  ? MASTECTOMY    ? left breat mastectomy 1997  ? TUBAL LIGATION    ? 1992  ? ? ?Prior to Admission medications   ?Medication Sig Start Date End Date Taking? Authorizing Provider  ?amLODipine (NORVASC) 10 MG tablet TAKE 1 TABLET BY MOUTH EVERY DAY IN THE MORNING 04/24/21  Yes Hoyt Koch, MD  ?benztropine (COGENTIN) 0.5 MG tablet Take 0.5 mg by mouth daily.    Yes [provider]  ?haloperidol (HALDOL) 5 MG tablet Take 2.5 mg by mouth daily.    Yes [provider]  ?haloperidol decanoate (HALDOL DECANOATE) 50 MG/ML injection Inject 100 mg into the muscle every 28 (twenty-eight) days.   Yes [provider]  ?HYDROcodone-acetaminophen (NORCO) 5-325 MG tablet Take 1 tablet by mouth 3 (three) times daily as needed. 12/23/20  Yes Aundra Dubin, PA-C  ?levothyroxine (SYNTHROID) 100 MCG tablet  TAKE 1 TABLET BY MOUTH EVERY DAY 06/05/21  Yes Ailene Ards, NP  ?lisinopril-hydrochlorothiazide (ZESTORETIC) 20-12.5 MG tablet Take 1 tablet by mouth daily. 02/11/21  Yes Hoyt Koch, MD  ?Multiple Vitamins-Minerals (MULTIVITAMIN WITH MINERALS) tablet Take 1 tablet by mouth daily.   Yes [provider]  ?methocarbamol (ROBAXIN) 500 MG tablet Take 1 tablet (500 mg total) by mouth 2 (two) times daily. 04/25/21   Prosperi, Darrick Meigs H, PA-C  ? ? ?Current Outpatient Medications  ?Medication Sig Dispense Refill  ? amLODipine (NORVASC) 10 MG tablet TAKE 1 TABLET BY MOUTH EVERY DAY IN THE MORNING 90 tablet 1  ? benztropine (COGENTIN) 0.5 MG tablet Take 0.5 mg by mouth daily.     ? haloperidol (HALDOL) 5 MG tablet Take 2.5 mg by mouth daily.     ? haloperidol decanoate (HALDOL DECANOATE) 50 MG/ML injection Inject 100 mg into the muscle every 28 (twenty-eight) days.    ? HYDROcodone-acetaminophen (NORCO) 5-325 MG tablet Take 1 tablet by mouth 3 (three) times daily as needed. 30 tablet 0  ? levothyroxine (SYNTHROID) 100 MCG tablet TAKE 1 TABLET BY MOUTH EVERY DAY 90 tablet 0  ? lisinopril-hydrochlorothiazide (ZESTORETIC) 20-12.5 MG tablet Take 1 tablet by mouth daily. 90 tablet 3  ? Multiple Vitamins-Minerals (MULTIVITAMIN WITH MINERALS) tablet Take 1 tablet by mouth daily.    ? methocarbamol (ROBAXIN) 500 MG tablet Take 1 tablet (500 mg total) by mouth 2 (  two) times daily. 20 tablet 0  ? ?Current Facility-Administered Medications  ?Medication Dose Route Frequency Provider Last Rate Last Admin  ? 0.9 %  sodium chloride infusion  500 mL Intravenous Once Sharyn Creamer, MD      ? ? ?Allergies as of 08/06/2021 - Review Complete 08/06/2021  ?Allergen Reaction Noted  ? Latex  08/26/2009  ? ? ?Family History  ?Problem Relation Age of Onset  ? Cancer Mother   ?     ? type  ? Stroke Father   ? Breast cancer Sister   ?     66's  ? Diabetes Brother   ? Coronary artery disease Brother   ? Colon cancer Neg Hx   ?  Esophageal cancer Neg Hx   ? Rectal cancer Neg Hx   ? Stomach cancer Neg Hx   ? ? ?Social History  ? ?Socioeconomic History  ? Marital status: Married  ?  Spouse name: Not on file  ? Number of children: Not on file  ? Years of education: Not on file  ? Highest education level: Not on file  ?Occupational History  ? Not on file  ?Tobacco Use  ? Smoking status: Former  ?  Packs/day: 0.50  ?  Years: 37.00  ?  Pack years: 18.50  ?  Types: Cigarettes  ? Smokeless tobacco: Never  ?Vaping Use  ? Vaping Use: Former  ?Substance and Sexual Activity  ? Alcohol use: No  ?  Comment: Pt denies  ? Drug use: No  ?  Comment: Pt denies   ? Sexual activity: Yes  ?  Birth control/protection: Surgical  ?  Comment: 1st intercourse 61 yo-More than 5 partners  ?Other Topics Concern  ? Not on file  ?Social History Narrative  ? Not on file  ? ?Social Determinants of Health  ? ?Financial Resource Strain: Not on file  ?Food Insecurity: Not on file  ?Transportation Needs: Not on file  ?Physical Activity: Not on file  ?Stress: Not on file  ?Social Connections: Not on file  ?Intimate Partner Violence: Not on file  ? ? ?Physical Exam: ?Vital signs in last 24 hours: ?BP (!) 142/97   Pulse 83   Temp 97.7 ?F (36.5 ?C)   Ht '5\' 3"'$  (1.6 m)   Wt 203 lb (92.1 kg)   LMP 02/18/2011   SpO2 97%   BMI 35.96 kg/m?  ?GEN: NAD ?EYE: Sclerae anicteric ?ENT: MMM ?CV: Non-tachycardic ?Pulm: No increased WOB ?GI: Soft ?NEURO:  Alert & Oriented ? ? ?Christia Reading, MD ?[facility] ? ? ?08/06/2021 10:20 AM ? ?

## 2021-08-06 NOTE — Progress Notes (Signed)
Report to PACU, RN, vss, BBS= Clear.  

## 2021-08-06 NOTE — Progress Notes (Signed)
Called to room to assist during endoscopic procedure.  Patient ID and intended procedure confirmed with present staff. Received instructions for my participation in the procedure from the performing physician.  

## 2021-08-06 NOTE — Op Note (Signed)
[place] ?Patient Name: Jacqueline Berry ?Procedure Date: 08/06/2021 10:23 AM ?MRN: [id number] ?Endoscopist: Sonny Masters "Christia Reading ,  ?Age: 61 ?Referring MD:  ?Date of Birth: Jan 07, 1961 ?Gender: Female ?Account #: [id number] ?Procedure:                Colonoscopy ?Indications:              Positive Cologuard test ?Medicines:                Monitored Anesthesia Care ?Procedure:                Pre-Anesthesia Assessment: ?                          - Prior to the procedure, a History and Physical  ?                          was performed, and patient medications and  ?                          allergies were reviewed. The patient's tolerance of  ?                          previous anesthesia was also reviewed. The risks  ?                          and benefits of the procedure and the sedation  ?                          options and risks were discussed with the patient.  ?                          All questions were answered, and informed consent  ?                          was obtained. Prior Anticoagulants: The patient has  ?                          taken no previous anticoagulant or antiplatelet  ?                          agents. ASA Grade Assessment: II - A patient with  ?                          mild systemic disease. After reviewing the risks  ?                          and benefits, the patient was deemed in  ?                          satisfactory condition to undergo the procedure. ?                          After obtaining informed consent, the colonoscope  ?  was passed under direct vision. Throughout the  ?                          procedure, the patient's blood pressure, pulse, and  ?                          oxygen saturations were monitored continuously. The  ?                          Olympus CF-HQ190L (Serial# [id number]) Colonoscope was  ?                          introduced through the anus and advanced to the the  ?                          terminal ileum. The colonoscopy  was performed  ?                          without difficulty. The patient tolerated the  ?                          procedure well. The quality of the bowel  ?                          preparation was good. The terminal ileum, ileocecal  ?                          valve, appendiceal orifice, and rectum were  ?                          photographed. ?Scope In: 10:37:21 AM ?Scope Out: 10:54:48 AM ?Scope Withdrawal Time: 0 hours 13 minutes 0 seconds  ?Total Procedure Duration: 0 hours 17 minutes 27 seconds  ?Findings:                 The terminal ileum appeared normal. ?                          Two sessile polyps were found in the ascending  ?                          colon and cecum. The polyps were 3 to 6 mm in size.  ?                          These polyps were removed with a cold snare.  ?                          Resection and retrieval were complete. ?                          A few small-mouthed diverticula were found in the  ?                          sigmoid colon. ?  Non-bleeding internal hemorrhoids were found during  ?                          retroflexion. ?Complications:            No immediate complications. ?Estimated Blood Loss:     Estimated blood loss was minimal. ?Impression:               - The examined portion of the ileum was normal. ?                          - Two 3 to 6 mm polyps in the ascending colon and  ?                          in the cecum, removed with a cold snare. Resected  ?                          and retrieved. ?                          - Diverticulosis in the sigmoid colon. ?                          - Non-bleeding internal hemorrhoids. ?Recommendation:           - Discharge patient to home (with escort). ?                          - Await pathology results. ?                          - The findings and recommendations were discussed  ?                          with the patient. ?Georgian Co,  ?08/06/2021 10:59:36 AM ?

## 2021-08-06 NOTE — Patient Instructions (Signed)
Handout on polyps, diverticulosis, and hemorrhoids provided  ? ?Await pathology results.  ? ?Continue current medications.  ? ?YOU HAD AN ENDOSCOPIC PROCEDURE TODAY AT [facility]:   Refer to the procedure report that was given to you for any specific questions about what was found during the examination.  If the procedure report does not answer your questions, please call your gastroenterologist to clarify.  If you requested that your care partner not be given the details of your procedure findings, then the procedure report has been included in a sealed envelope for you to review at your convenience later. ? ?YOU SHOULD EXPECT: Some feelings of bloating in the abdomen. Passage of more gas than usual.  Walking can help get rid of the air that was put into your GI tract during the procedure and reduce the bloating. If you had a lower endoscopy (such as a colonoscopy or flexible sigmoidoscopy) you may notice spotting of blood in your stool or on the toilet paper. If you underwent a bowel prep for your procedure, you may not have a normal bowel movement for a few days. ? ?Please Note:  You might notice some irritation and congestion in your nose or some drainage.  This is from the oxygen used during your procedure.  There is no need for concern and it should clear up in a day or so. ? ?SYMPTOMS TO REPORT IMMEDIATELY: ? ?Following lower endoscopy (colonoscopy or flexible sigmoidoscopy): ? Excessive amounts of blood in the stool ? Significant tenderness or worsening of abdominal pains ? Swelling of the abdomen that is new, acute ? Fever of 100?F or higher ? ?For urgent or emergent issues, a gastroenterologist can be reached at any hour by calling [phone]. ?Do not use MyChart messaging for urgent concerns.  ? ? ?DIET:  We do recommend a small meal at first, but then you may proceed to your regular diet.  Drink plenty of fluids but you should avoid alcoholic beverages for 24 hours. ? ?ACTIVITY:   You should plan to take it easy for the rest of today and you should NOT DRIVE or use heavy machinery until tomorrow (because of the sedation medicines used during the test).   ? ?FOLLOW UP: ?Our staff will call the number listed on your records 48-72 hours following your procedure to check on you and address any questions or concerns that you may have regarding the information given to you following your procedure. If we do not reach you, we will leave a message.  We will attempt to reach you two times.  During this call, we will ask if you have developed any symptoms of COVID 19. If you develop any symptoms (ie: fever, flu-like symptoms, shortness of breath, cough etc.) before then, please call [phone].  If you test positive for Covid 19 in the 2 weeks post procedure, please call and report this information to [place].   ? ?If any biopsies were taken you will be contacted by phone or by letter within the next 1-3 weeks.  Please call us at [phone] if you have not heard about the biopsies in 3 weeks.  ? ? ?SIGNATURES/CONFIDENTIALITY: ?You and/or your care partner have signed paperwork which will be entered into your electronic medical record.  These signatures attest to the fact that that the information above on your After Visit Summary has been reviewed and is understood.  Full responsibility of the confidentiality of this discharge information lies with you and/or your care-partner. ? ? ?

## 2021-08-06 NOTE — Progress Notes (Signed)
VS by DT  Pt's states no medical or surgical changes since previsit or office visit.  

## 2021-08-10 ENCOUNTER — Encounter: Payer: Self-pay | Admitting: Internal Medicine

## 2021-08-10 ENCOUNTER — Telehealth: Payer: Self-pay

## 2021-08-10 ENCOUNTER — Telehealth: Payer: Self-pay | Admitting: *Deleted

## 2021-08-10 NOTE — Telephone Encounter (Signed)
Attempted to reach pt. With follow-up call following endoscopic procedure 08/06/2021.  LM On pt. Voice mail.  Will try to reach pt. Again later today. ?

## 2021-08-10 NOTE — Telephone Encounter (Signed)
Second attempt, left VM.  

## 2021-08-11 ENCOUNTER — Telehealth: Payer: Self-pay | Admitting: Internal Medicine

## 2021-08-11 NOTE — Telephone Encounter (Signed)
Following letter mailed today: ? ?[name] ?[id number] ?[place] [address] ?  ?Dear Ms. Kalman Shan, ?  ?The polyps removed from your colon were benign, but precancerous. This means that they had the potential to change into cancer over time had they not been removed. ?  ?I recommend you have a repeat colonoscopy in 7 years to determine if you have developed any new polyps and to screen for colorectal cancer.  ?  ?If you develop any new rectal bleeding, abdominal pain or significant bowel habit changes, please contact our office at [phone]. ?  ?Please call us if you have persistent problems or have questions about your condition that have not been fully answered at this time. ?  ?Sincerely, ?  ?Sharyn Creamer, MD ?

## 2021-08-11 NOTE — Telephone Encounter (Signed)
Patient called requesting to speak with nurse regarding results. Please advise. ?

## 2021-08-12 NOTE — Telephone Encounter (Signed)
Pathology letter (My Chart message) viewed by pt below: ? ?Last read by Jacqueline Berry at  6:41 PM on 08/11/2021. ?

## 2021-08-13 ENCOUNTER — Other Ambulatory Visit: Payer: Self-pay | Admitting: Internal Medicine

## 2021-08-18 ENCOUNTER — Ambulatory Visit: Payer: Medicare Other | Admitting: Orthopaedic Surgery

## 2021-08-18 DIAGNOSIS — M84452A Pathological fracture, left femur, initial encounter for fracture: Secondary | ICD-10-CM

## 2021-08-18 DIAGNOSIS — S83242A Other tear of medial meniscus, current injury, left knee, initial encounter: Secondary | ICD-10-CM | POA: Insufficient documentation

## 2021-08-18 DIAGNOSIS — M1712 Unilateral primary osteoarthritis, left knee: Secondary | ICD-10-CM | POA: Diagnosis not present

## 2021-08-18 MED ORDER — MELOXICAM 7.5 MG PO TABS
7.5000 mg | ORAL_TABLET | Freq: Two times a day (BID) | ORAL | 2 refills | Status: DC | PRN
Start: 2021-08-18 — End: 2022-02-17

## 2021-08-18 NOTE — Addendum Note (Signed)
Addended by: Precious Bard on: 08/18/2021 01:14 PM ? ? Modules accepted: Orders ? ?

## 2021-08-18 NOTE — Progress Notes (Signed)
? ?Office Visit Note ?  ?Patient: Jacqueline Berry           ?Date of Birth: 27-Sep-1960           ?MRN: [id number] ?Visit Date: 08/18/2021 ?             ?Requested by: Hoyt Koch, MD ?[place],  [place] [address] ?PCP: Hoyt Koch, MD ? ? ?Assessment & Plan: ?Visit Diagnoses:  ?1. Insufficiency fracture of femur, left, initial encounter ([place])   ?2. Primary osteoarthritis of left knee   ? ? ?Plan: Impression is chronic left knee pain.  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  Currently her pain is manageable.  Mobic was prescribed.  Based on her options she would like to continue with conservative management.  Follow-up as needed. ? ?Follow-Up Instructions: No follow-ups on file.  ? ?Orders:  ?No orders of the defined types were placed in this encounter. ? ?No orders of the defined types were placed in this encounter. ? ? ? ? Procedures: ?No procedures performed ? ? ?Clinical Data: ?No additional findings. ? ? ?Subjective: ?Chief Complaint  ?Patient presents with  ? Left Knee - Pain  ? ? ?HPI ? ?[name] returns today for chronic left knee pain.  Durolane injection in did not help her pain.  Continues to take omega-3.  Pain comes and goes.  Reports stiffness in the morning. ? ?Review of Systems ? ? ?Objective: ?Vital Signs: LMP 02/18/2011  ? ?Physical Exam ? ?Ortho Exam ? ?Left knee exam is unchanged. ? ?Specialty Comments:  ?No specialty comments available. ? ?Imaging: ?No results found. ? ? ?PMFS History: ?Patient Active Problem List  ? Diagnosis Date Noted  ? Acute medial meniscus tear of left knee 08/18/2021  ? Primary osteoarthritis of left knee 08/18/2021  ? Positive colorectal cancer screening using Cologuard test 06/11/2021  ? Insufficiency fracture of femur ([place]) 12/18/2020  ? Hyperlipidemia LDL goal <130 12/31/2019  ? Urinary incontinence 09/30/2017  ? Routine general medical examination at a health care facility 09/30/2017  ?  Morbid obesity ([place]) 10/16/2009  ? Hypothyroidism 07/11/2009  ? Paranoid schizophrenia in remission ([place]) 06/24/2009  ? Essential hypertension 06/24/2009  ? HX, PERSONAL, MALIGNANCY, BREAST 12/07/2006  ? ?Past Medical History:  ?Diagnosis Date  ? Bipolar disorder ([place])   ? Breast cancer ([place])   ? with chemo - 1997  ? Hypertension   ? Syphilis   ? Thyroid disease   ? Thyroid disease   ?  ?Family History  ?Problem Relation Age of Onset  ? Cancer Mother   ?     ? type  ? Stroke Father   ? Breast cancer Sister   ?     40's  ? Diabetes Brother   ? Coronary artery disease Brother   ? Colon cancer Neg Hx   ? Esophageal cancer Neg Hx   ? Rectal cancer Neg Hx   ? Stomach cancer Neg Hx   ?  ?Past Surgical History:  ?Procedure Laterality Date  ? BREAST BIOPSY  1997  ? MASTECTOMY    ? left breat mastectomy 1997  ? TUBAL LIGATION    ? 1992  ? ?Social History  ? ?Occupational History  ? Not on file  ?Tobacco Use  ? Smoking status: Former  ?  Packs/day: 0.50  ?  Years: 37.00  ?  Pack years: 18.50  ?  Types: Cigarettes  ? Smokeless  tobacco: Never  ?Vaping Use  ? Vaping Use: Former  ?Substance and Sexual Activity  ? Alcohol use: No  ?  Comment: Pt denies  ? Drug use: No  ?  Comment: Pt denies   ? Sexual activity: Yes  ?  Birth control/protection: Surgical  ?  Comment: 1st intercourse 61 yo-More than 5 partners  ? ? ? ? ? ? ?

## 2021-08-20 ENCOUNTER — Ambulatory Visit: Payer: Medicare Other

## 2021-08-26 ENCOUNTER — Ambulatory Visit: Payer: Medicare Other

## 2021-08-26 ENCOUNTER — Encounter: Payer: Self-pay | Admitting: Physical Therapy

## 2021-08-26 ENCOUNTER — Ambulatory Visit: Payer: Medicare Other | Admitting: Physical Therapy

## 2021-08-26 ENCOUNTER — Other Ambulatory Visit: Payer: Self-pay

## 2021-08-26 DIAGNOSIS — R262 Difficulty in walking, not elsewhere classified: Secondary | ICD-10-CM

## 2021-08-26 DIAGNOSIS — M6281 Muscle weakness (generalized): Secondary | ICD-10-CM | POA: Diagnosis not present

## 2021-08-26 DIAGNOSIS — R2689 Other abnormalities of gait and mobility: Secondary | ICD-10-CM | POA: Diagnosis not present

## 2021-08-26 DIAGNOSIS — G8929 Other chronic pain: Secondary | ICD-10-CM | POA: Diagnosis not present

## 2021-08-26 DIAGNOSIS — M25562 Pain in left knee: Secondary | ICD-10-CM | POA: Diagnosis not present

## 2021-08-26 DIAGNOSIS — R600 Localized edema: Secondary | ICD-10-CM

## 2021-08-26 NOTE — Therapy (Signed)
?OUTPATIENT PHYSICAL THERAPY LOWER EXTREMITY EVALUATION ? ? ?Patient Name: Jacqueline Berry ?MRN: [id number] ?DOB:1960/06/08, 61 y.o., female ?Today's Date: 08/26/2021 ? ? PT End of Session - 08/26/21 1455   ? ? Visit Number 1   ? Number of Visits 10   ? Date for PT Re-Evaluation 10/21/21   ? Authorization Type [employer] MCR   ? PT Start Time 1430   ? PT Stop Time 1500   ? PT Time Calculation (min) 30 min   ? Activity Tolerance Patient tolerated treatment well   ? Behavior During Therapy [facility] for tasks assessed/performed   ? ?  ?  ? ?  ? ? ?Past Medical History:  ?Diagnosis Date  ? Bipolar disorder ([place])   ? Breast cancer ([place])   ? with chemo - 1997  ? Hypertension   ? Syphilis   ? Thyroid disease   ? Thyroid disease   ? ?Past Surgical History:  ?Procedure Laterality Date  ? BREAST BIOPSY  1997  ? MASTECTOMY    ? left breat mastectomy 1997  ? TUBAL LIGATION    ? 1992  ? ?Patient Active Problem List  ? Diagnosis Date Noted  ? Acute medial meniscus tear of left knee 08/18/2021  ? Primary osteoarthritis of left knee 08/18/2021  ? Positive colorectal cancer screening using Cologuard test 06/11/2021  ? Insufficiency fracture of femur ([place]) 12/18/2020  ? Hyperlipidemia LDL goal <130 12/31/2019  ? Urinary incontinence 09/30/2017  ? Routine general medical examination at a health care facility 09/30/2017  ? Morbid obesity ([place]) 10/16/2009  ? Hypothyroidism 07/11/2009  ? Paranoid schizophrenia in remission ([place]) 06/24/2009  ? Essential hypertension 06/24/2009  ? HX, PERSONAL, MALIGNANCY, BREAST 12/07/2006  ? ? ?PCP: Hoyt Koch, MD ? ?REFERRING PROVIDER: Leandrew Koyanagi, MD ? ?REFERRING DIAG: M17.12 (ICD-10-CM) - Primary osteoarthritis of left knee ? ?THERAPY DIAG:  ?Chronic pain of left knee ? ?Muscle weakness (generalized) ? ?Difficulty in walking, not elsewhere classified ? ?Localized edema ? ?ONSET DATE: She says left knee pain since June 2022, had left knee scope 12/18/20 ? ?SUBJECTIVE:  ? ?SUBJECTIVE STATEMENT: She relays  she continues to have knee pain after her surgery last year and MD says she might need TKA but she is not ready for that so wants to try PT. ? ? ?PERTINENT HISTORY: ? S/P left knee scope with debriedment of medial meniscus 12/18/20 ? ?PAIN:  ?Are you having pain? Yes: NPRS scale: 5/10 ?Pain location: left medial knee ?Pain description: dull pain ?Aggravating factors: walking more than 10  minutes, squatting ?Relieving factors: resting or sitting ? ?PRECAUTIONS: None ? ?WEIGHT BEARING RESTRICTIONS No ? ?FALLS:  ?Has patient fallen in last 6 months? Yes 2-3 weeks ago, does not have fear of falling ? ?OCCUPATION: security guard, sitting work ? ?PLOF: Independent ? ?PATIENT GOALS : get back to walking without a limp, reduce pain. ? ? ?OBJECTIVE:  ? ?DIAGNOSTIC FINDINGS: XR 04/25/21 IMPRESSION: ?1. There is a new, depressed subchondral fracture of the medial ?femoral condyle. ?2. No macroscopic fracture or dislocation of the left knee. ?3. Mild tricompartmental arthrosis. ?4. Small, nonspecific knee joint effusion. ? ?PATIENT SURVEYS:  ?08/26/21 FOTO 43% functional, goal is 54% ? ?COGNITION: ? Overall cognitive status: Within functional limits for tasks assessed   ?  ?SENSATION: ?WFL ? ?MUSCLE LENGTH: ?Hamstrings: mild tightness Lt ?Thomas test: mild tightness Lt ? ?POSTURE:  ?Slumped posture ? ?PALPATION: ?Denies TTP ? ?LE ROM: ? ?Active ROM Right ?08/26/2021 Left ?08/26/2021  ?Hip  flexion    ?Hip extension    ?Hip abduction    ?Hip adduction    ?Hip internal rotation    ?Hip external rotation    ?Knee flexion  130  ?Knee extension  0  ?Ankle dorsiflexion    ?Ankle plantarflexion    ?Ankle inversion    ?Ankle eversion    ? (Blank rows = not tested) ? ?LE MMT: ? ?MMT in sitting Right ?08/26/2021 Left ?08/26/2021  ?Hip flexion  4+  ?Hip extension    ?Hip abduction  4+  ?Hip adduction    ?Hip internal rotation    ?Hip external rotation    ?Knee flexion  4  ?Knee extension  4  ?Ankle dorsiflexion    ?Ankle plantarflexion    ?Ankle  inversion    ?Ankle eversion    ? (Blank rows = not tested) ? ?LOWER EXTREMITY SPECIAL TESTS: negative varus and valgus stress test ? ?FUNCTIONAL TESTS:  ?5 times sit to stand: 11.2 seconds no UE support ? ?GAIT: ? 08/26/21 ?Distance walked: 100 ?Assistive device utilized: None ?Level of assistance: Complete Independence ?Comments: slower velocity, wider BOS, mildly antalgic gait ? ? ? ?TODAY'S TREATMENT: ?Created and reviewed HEP and PT plan of care listed below ? ? ?PATIENT EDUCATION:  ?Education details: HEP,PT plan of care ?Person educated: Patient ?Education method: Explanation, Demonstration, Verbal cues, and Handouts ?Education comprehension: verbalized understanding and needs further education ? ? ?HOME EXERCISE PROGRAM: ?Access Code: [email or web address] ?URL: [email or web address] ?Date: 08/26/2021 ?Prepared by: Elsie Ra ? ?Exercises ?- Seated Hamstring Stretch  - 1-2 x daily - 6 x weekly - 1 sets - 2 reps - 30 hold ?- Supine Quadriceps Stretch with Strap on Table  - 1-2 x daily - 6 x weekly - 2 reps - 30 hold ?- Seated Straight Leg Raise with Quad Contraction  - 2 x daily - 6 x weekly - 2-3 sets - 10-20 reps ?- Seated Long Arc Quad  - 2 x daily - 6 x weekly - 2-3 sets - 10-20 reps ?- Sit to Stand Without Arm Support  - 1-2 x daily - 6 x weekly - 1-2 sets - 10 reps ?- Standing Marching  - 1-2 x daily - 6 x weekly - 1-2 sets - 10-15 reps ?- Hip Abduction with Resistance Loop  - 1-2 x daily - 6 x weekly - 1-2 sets - 10-15 reps ? ?ASSESSMENT: ? ? ?ASSESSMENT: ? ?CLINICAL IMPRESSION: Patient presents with chronic left knee pain and OA. 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 Of note she is S/P left knee scope with debriedment of medial meniscus 12/18/20. Patient will benefit from skilled PT to address below impairments, limitations and improve overall function. ? ?OBJECTIVE IMPAIRMENTS: decreased activity  tolerance, difficulty walking, decreased balance, decreased endurance, decreased mobility, decreased ROM, decreased strength, impaired flexibility, impaired LE use, postural dysfunction, and pain. ? ?ACTIVITY LIMITATIONS: bending, lifting, carry, locomotion, cleaning, community activity, driving, and or occupation ? ?PERSONAL FACTORS: chronic pain and previous knee surgery are also affecting patient's functional outcome. ? ?REHAB POTENTIAL: Fair to good ? ?CLINICAL DECISION MAKING: Stable/uncomplicated ? ?EVALUATION COMPLEXITY: Low ? ? ? ?GOALS: ?Short term PT Goals Target date: 09/23/2021 ?Pt will be I and compliant with HEP. ?Baseline:  ?Goal status: New ?Pt will decrease pain by 25% overall ?Baseline: ?Goal status: New ? ?Long term PT goals Target date: 10/21/2021 ?Pt will improve  hip/knee strength  in sitting to at least 5-/5 MMT to improve functional strength ?Baseline: ?Goal status: New ?Pt will improve FOTO to at least 54% functional to show improved function ?Baseline: ?Goal status: New ?Pt will reduce pain by overall 50% overall with usual activity ?Baseline: ?Goal status: New ?Pt will be able to ambulate community distances at least 15 minutes without antalgic gait pattern ?Baseline: ?Goal status: New ?      5. Pt will improve 5 times sit to stand score to less than 10 seconds to show improved leg strength/endurance ? ?PLAN: ?PT FREQUENCY: 1-2 times per week  ? ?PT DURATION: 6-8 weeks ? ?PLANNED INTERVENTIONS (unless contraindicated): aquatic PT, Canalith repositioning, cryotherapy, Electrical stimulation, Iontophoresis with 4 mg/ml dexamethasome, Moist heat, traction, Ultrasound, gait training, Therapeutic exercise, balance training, neuromuscular re-education, patient/family education, prosthetic training, manual techniques, passive ROM, dry needling, taping, vasopnuematic device, vestibular, spinal manipulations, joint manipulations ? ?PLAN FOR NEXT SESSION: how is HEP, emphasize knee and hip strength to  tolerance ? ? ? ?Debbe Odea, PT,DPT ?08/26/2021, 2:57 PM ? ?

## 2021-08-27 ENCOUNTER — Ambulatory Visit
Admission: RE | Admit: 2021-08-27 | Discharge: 2021-08-27 | Disposition: A | Discharge status: Home / Self Care | Payer: Medicare Other | Source: Ambulatory Visit | Attending: Internal Medicine | Admitting: Internal Medicine

## 2021-08-27 DIAGNOSIS — Z1231 Encounter for screening mammogram for malignant neoplasm of breast: Secondary | ICD-10-CM | POA: Diagnosis not present

## 2021-08-27 HISTORY — DX: Personal history of antineoplastic chemotherapy: Z92.21

## 2021-08-31 ENCOUNTER — Other Ambulatory Visit: Payer: Self-pay | Admitting: Nurse Practitioner

## 2021-08-31 DIAGNOSIS — E039 Hypothyroidism, unspecified: Secondary | ICD-10-CM

## 2021-08-31 IMAGING — MG DIGITAL SCREENING UNILAT RIGHT W/ TOMO W/ CAD
4 series · 4 of 12 positions shown · non-contrast
Comparison: Previous exam(s).

CLINICAL DATA: Screening.

EXAM:
DIGITAL SCREENING UNILATERAL RIGHT MAMMOGRAM WITH CAD AND TOMO

[R MLO synth-2D]
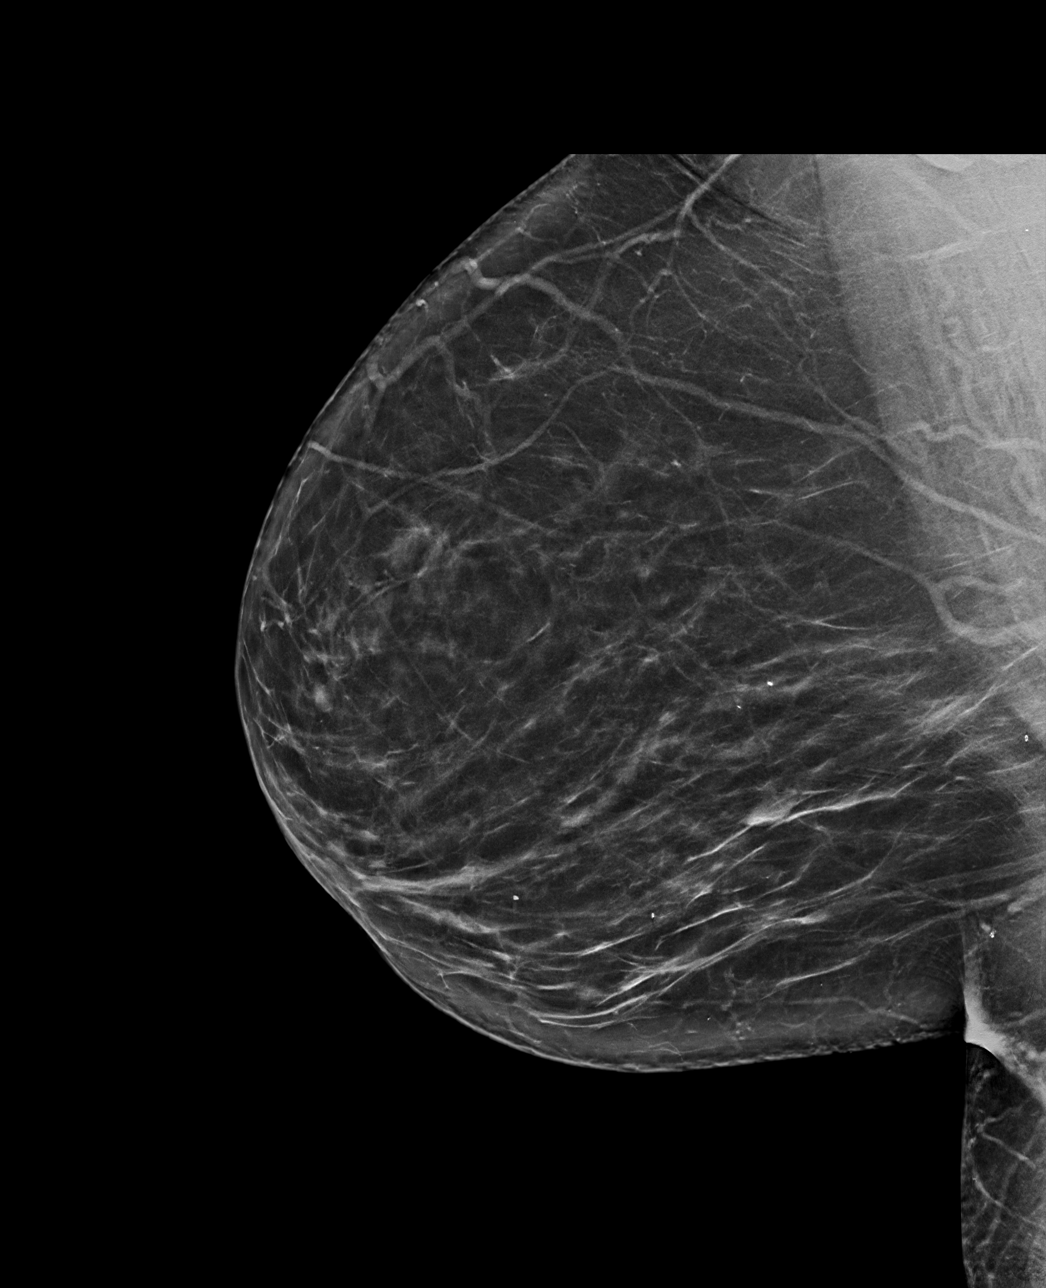

[R CC synth-2D]
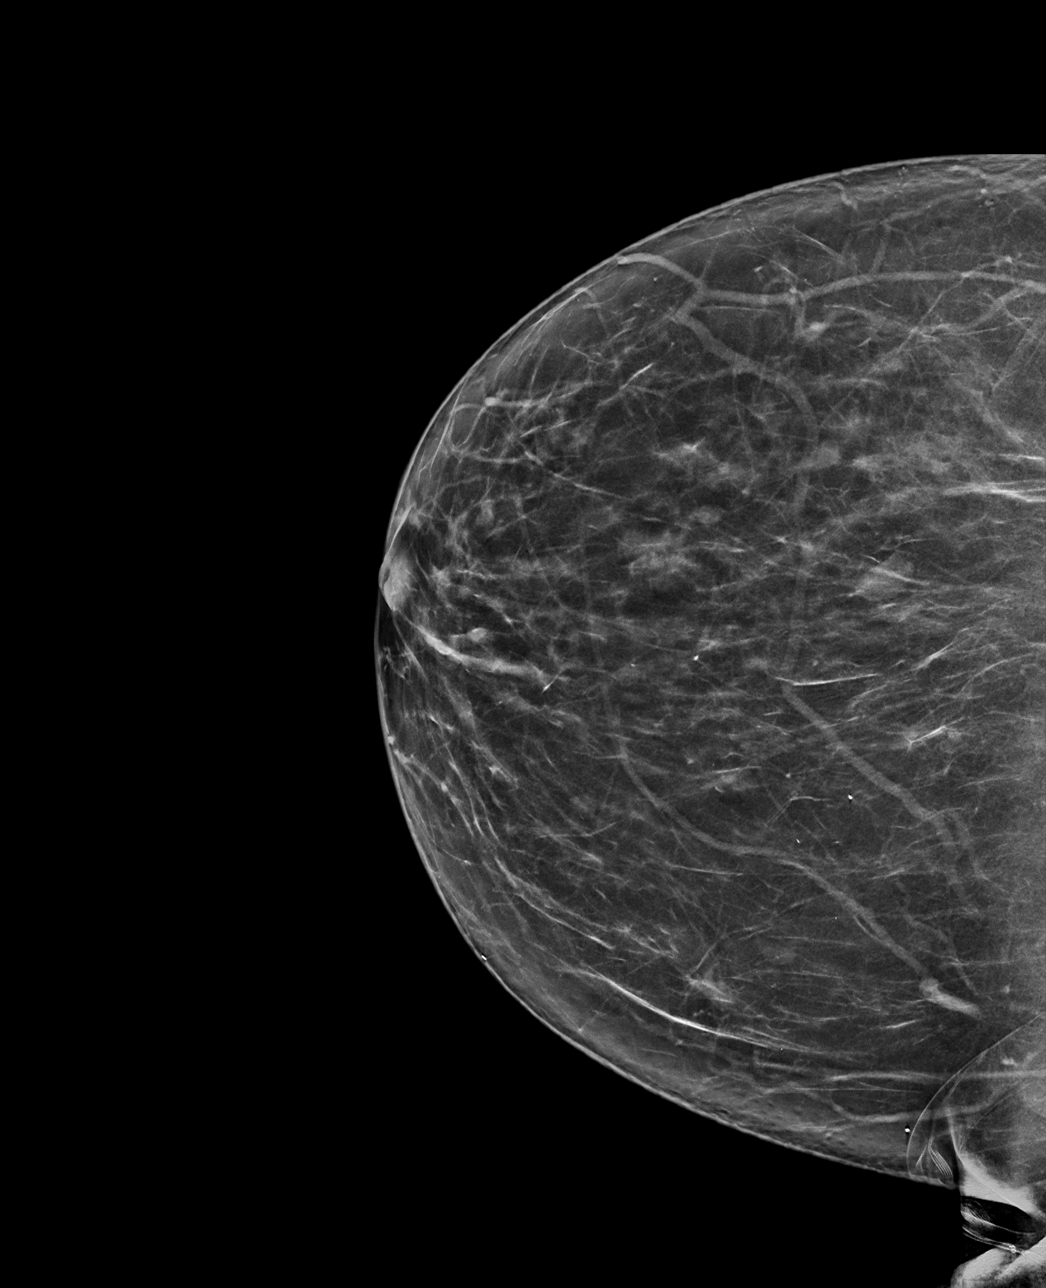

[R CC tomo · tomo slice 37/73.0]
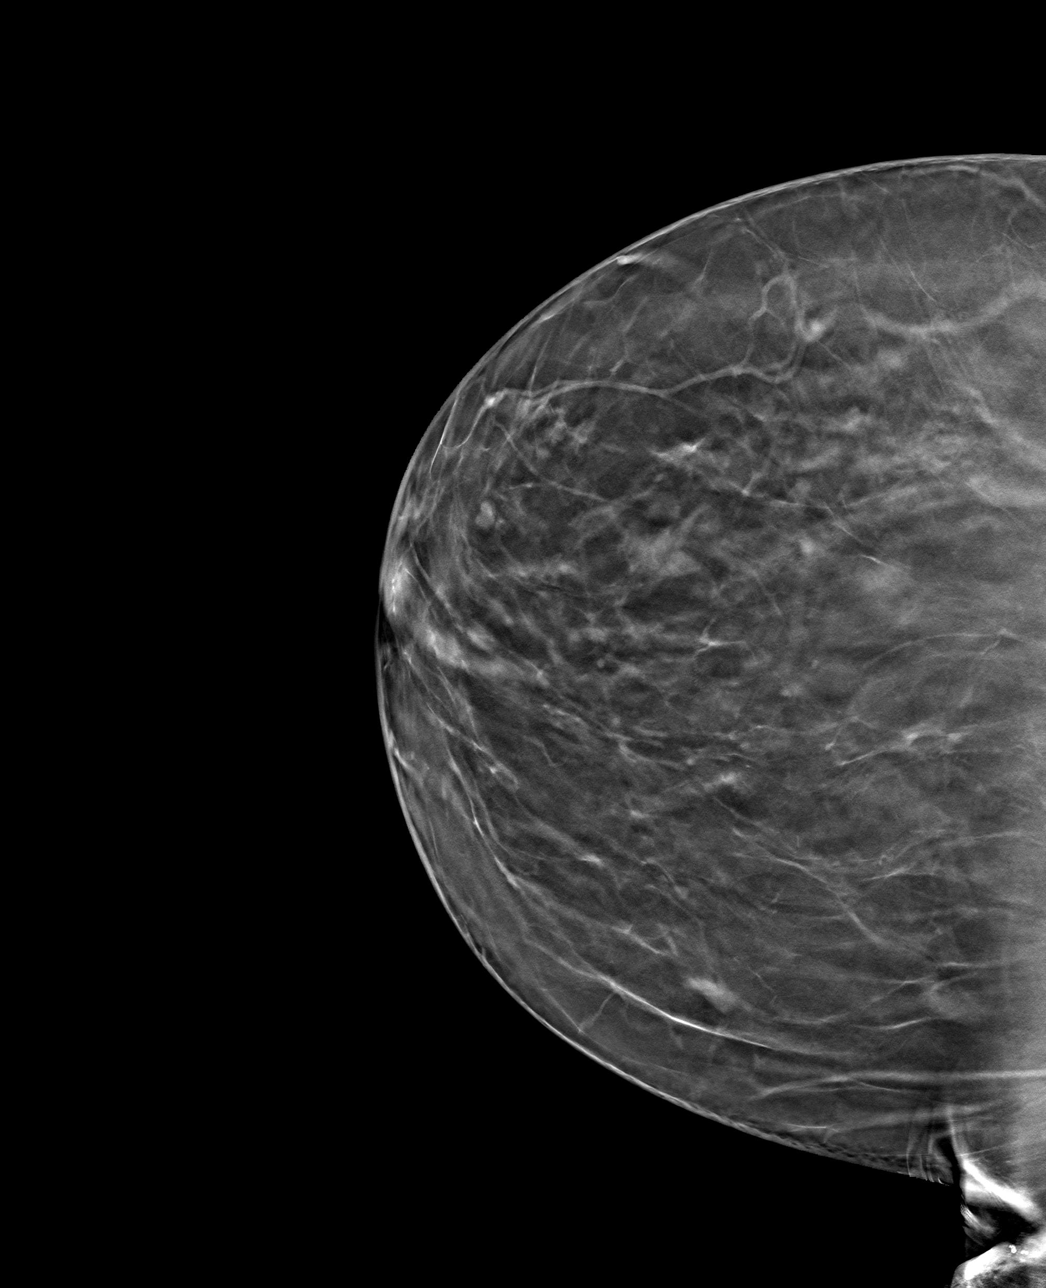

[R MLO tomo · tomo slice 44/87.0]
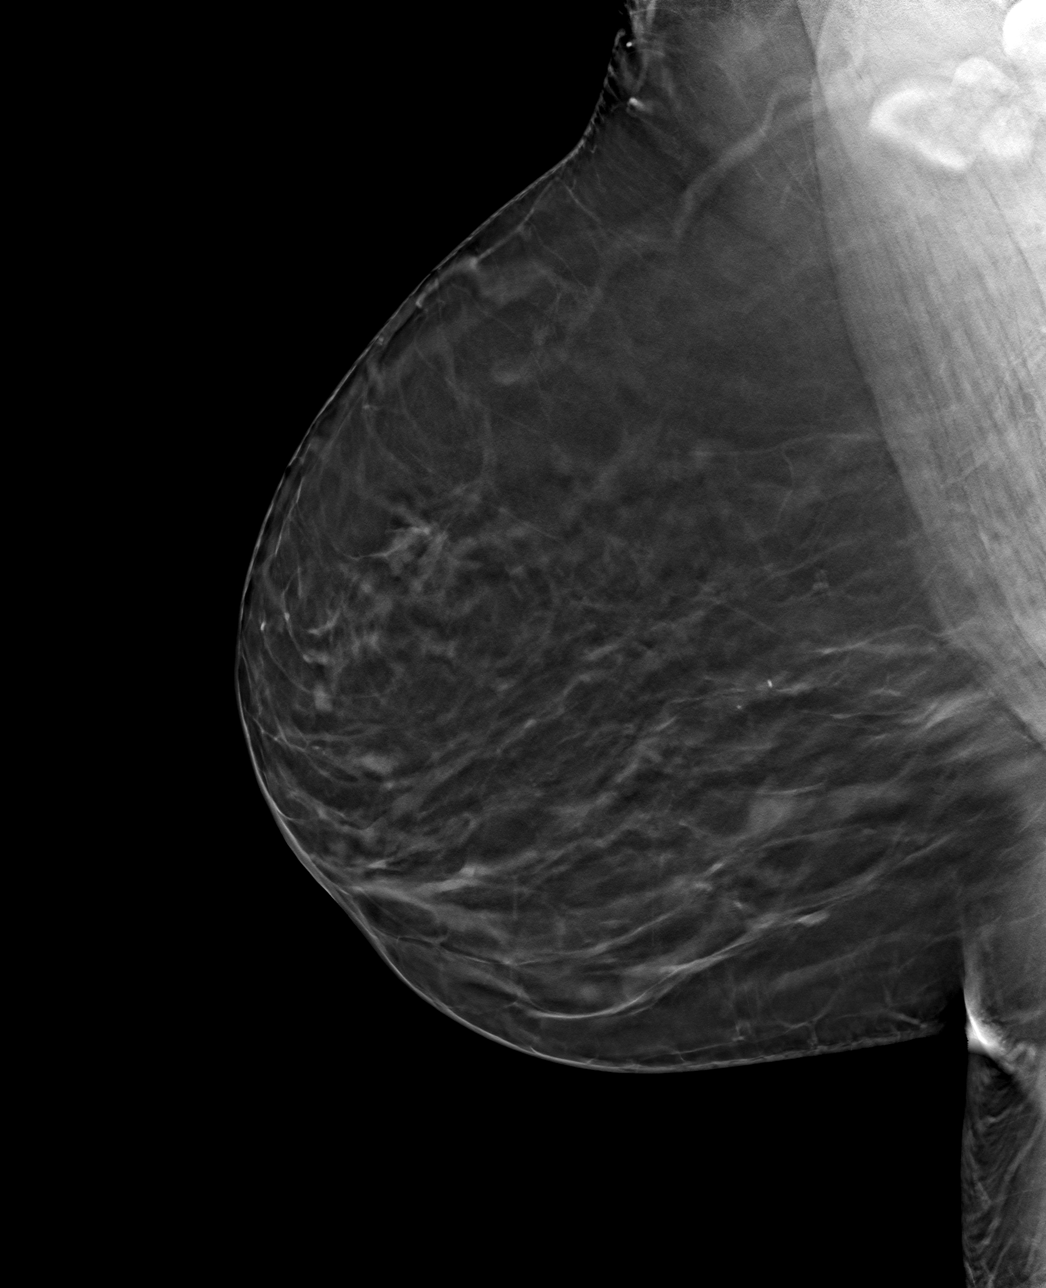

[4 of 12 positions shown; findings below may reference images not displayed]

ACR Breast Density Category b: There are scattered areas of
fibroglandular density.
FINDINGS: The patient has had a left mastectomy. There are no findings
suspicious for malignancy. Images were processed with CAD.
IMPRESSION: No mammographic evidence of malignancy. A result letter of this
screening mammogram will be mailed directly to the patient.

RECOMMENDATION:
Screening mammogram in one year.  (Code:H3-9-9K3)

BI-RADS CATEGORY  1: Negative.

## 2021-09-03 ENCOUNTER — Telehealth: Payer: Self-pay | Admitting: Internal Medicine

## 2021-09-03 DIAGNOSIS — E039 Hypothyroidism, unspecified: Secondary | ICD-10-CM

## 2021-09-03 MED ORDER — LEVOTHYROXINE SODIUM 100 MCG PO TABS: 100.0000 ug | ORAL_TABLET | Freq: Every day | ORAL | 0 refills | Status: DC

## 2021-09-03 NOTE — Telephone Encounter (Signed)
Refill has been sent to the pt's pharmacy  

## 2021-09-03 NOTE — Addendum Note (Signed)
Addended by: Thomes Cake on: 09/03/2021 01:14 PM   Modules accepted: Orders

## 2021-09-03 NOTE — Telephone Encounter (Signed)
PT calls today in regards to getting a refill on their Levothyroxine (SYNTHROID) 100 MCG.   PT states they had tried to get it last Saturday and their pharmacy had let them know they were faxing the request over. I wasn't sure if we had received that yet or not.  CB: [phone]

## 2021-09-09 ENCOUNTER — Ambulatory Visit (INDEPENDENT_AMBULATORY_CARE_PROVIDER_SITE_OTHER): Payer: Medicare Other | Admitting: Physical Therapy

## 2021-09-09 ENCOUNTER — Encounter: Payer: Self-pay | Admitting: Physical Therapy

## 2021-09-09 DIAGNOSIS — R262 Difficulty in walking, not elsewhere classified: Secondary | ICD-10-CM | POA: Diagnosis not present

## 2021-09-09 DIAGNOSIS — M25562 Pain in left knee: Secondary | ICD-10-CM

## 2021-09-09 DIAGNOSIS — R600 Localized edema: Secondary | ICD-10-CM

## 2021-09-09 DIAGNOSIS — G8929 Other chronic pain: Secondary | ICD-10-CM

## 2021-09-09 DIAGNOSIS — M6281 Muscle weakness (generalized): Secondary | ICD-10-CM | POA: Diagnosis not present

## 2021-09-09 NOTE — Therapy (Signed)
OUTPATIENT PHYSICAL THERAPY TREATMENT NOTE   Patient Name: Jacqueline Berry MRN: [id number] DOB:07-12-1960, 61 y.o., female Today's Date: 09/09/2021  END OF SESSION:   PT End of Session - 09/09/21 1505     Visit Number 2    Number of Visits 10    Date for PT Re-Evaluation 10/21/21    Authorization Type UHC MCR    PT Start Time 5400    PT Stop Time 8676    PT Time Calculation (min) 38 min    Activity Tolerance Patient tolerated treatment well    Behavior During Therapy WFL for tasks assessed/performed             Past Medical History:  Diagnosis Date   Bipolar disorder ([place])    Breast cancer ([place])    with chemo - 1997   Hypertension    Personal history of chemotherapy    Syphilis    Thyroid disease    Thyroid disease    Past Surgical History:  Procedure Laterality Date   BREAST BIOPSY  1997   MASTECTOMY     left breat mastectomy [place]   Patient Active Problem List   Diagnosis Date Noted   Acute medial meniscus tear of left knee 08/18/2021   Primary osteoarthritis of left knee 08/18/2021   Positive colorectal cancer screening using Cologuard test 06/11/2021   Insufficiency fracture of femur ([place]) 12/18/2020   Hyperlipidemia LDL goal <130 12/31/2019   Urinary incontinence 09/30/2017   Routine general medical examination at a health care facility 09/30/2017   Morbid obesity ([place]) 10/16/2009   Hypothyroidism 07/11/2009   Paranoid schizophrenia in remission ([place]) 06/24/2009   Essential hypertension 06/24/2009   HX, PERSONAL, MALIGNANCY, BREAST 12/07/2006    THERAPY DIAG:  Chronic pain of left knee  Muscle weakness (generalized)  Difficulty in walking, not elsewhere classified  Localized edema  Rationale for Evaluation and Treatment Rehabilitation PCP: Hoyt Koch, MD   REFERRING PROVIDER: Leandrew Koyanagi, MD   REFERRING DIAG: [phone] (ICD-10-CM) - Primary osteoarthritis of left knee   ONSET DATE: She says left knee  pain since June 2022, had left knee scope 12/18/20   SUBJECTIVE:    SUBJECTIVE STATEMENT: She relays the pain is not that bad upon arrival, it is worse when she first gets up  PERTINENT HISTORY:  S/P left knee scope with debriedment of medial meniscus 12/18/20   PAIN:  Are you having pain? Yes: NPRS scale: 5/10 Pain location: left medial knee Pain description: dull pain Aggravating factors: walking more than 10  minutes, squatting Relieving factors: resting or sitting   PRECAUTIONS: None   WEIGHT BEARING RESTRICTIONS No   FALLS:  Has patient fallen in last 6 months? Yes 2-3 weeks ago, does not have fear of falling   OCCUPATION: security guard, sitting work   PLOF: Independent   PATIENT GOALS : get back to walking without a limp, reduce pain.     OBJECTIVE:    DIAGNOSTIC FINDINGS: XR 04/25/21 IMPRESSION: 1. There is a new, depressed subchondral fracture of the medial femoral condyle. 2. No macroscopic fracture or dislocation of the left knee. 3. Mild tricompartmental arthrosis. 4. Small, nonspecific knee joint effusion.   PATIENT SURVEYS:  08/26/21 FOTO 43% functional, goal is 54%   COGNITION:           Overall cognitive status: Within functional limits for tasks assessed  SENSATION: WFL   MUSCLE LENGTH: Hamstrings: mild tightness Lt Thomas test: mild tightness Lt   POSTURE:  Slumped posture   PALPATION: Denies TTP   LE ROM:   Active ROM Right 08/26/2021 Left 08/26/2021  Hip flexion      Hip extension      Hip abduction      Hip adduction      Hip internal rotation      Hip external rotation      Knee flexion   130  Knee extension   0  Ankle dorsiflexion      Ankle plantarflexion      Ankle inversion      Ankle eversion       (Blank rows = not tested)   LE MMT:   MMT in sitting Right 08/26/2021 Left 08/26/2021  Hip flexion   4+  Hip extension      Hip abduction   4+  Hip adduction      Hip internal rotation      Hip  external rotation      Knee flexion   4  Knee extension   4  Ankle dorsiflexion      Ankle plantarflexion      Ankle inversion      Ankle eversion       (Blank rows = not tested)   LOWER EXTREMITY SPECIAL TESTS: negative varus and valgus stress test   FUNCTIONAL TESTS:  5 times sit to stand: 11.2 seconds no UE support   GAIT:           08/26/21 Distance walked: 100 Assistive device utilized: None Level of assistance: Complete Independence Comments: slower velocity, wider BOS, mildly antalgic gait       TODAY'S TREATMENT: 09/09/21 Recumbent bike L3  X8 min Leg press DL 75# 2X15, then left leg only 37# 2X15 Forward step ups 6 inch X 10 on left Lateral step ups 6 inch X 10 bilat Seated LAQ X 20 5# bilat Standing H.S curls 5# X 15 bilat Standing hip abd 5#  X15 bilat Seated Hamstring stretch 30 sec X 3 on left Seated SLR 2X10 on left Supine quad stretch 3X30 sec on left     PATIENT EDUCATION:  Education details: HEP,PT plan of care Person educated: Patient Education method: Explanation, Demonstration, Verbal cues, and Handouts Education comprehension: verbalized understanding and needs further education     HOME EXERCISE PROGRAM: Access Code: [email or web address] URL: [email or web address] Date: 08/26/2021 Prepared by: Elsie Ra   Exercises - Seated Hamstring Stretch  - 1-2 x daily - 6 x weekly - 1 sets - 2 reps - 30 hold - Supine Quadriceps Stretch with Strap on Table  - 1-2 x daily - 6 x weekly - 2 reps - 30 hold - Seated Straight Leg Raise with Quad Contraction  - 2 x daily - 6 x weekly - 2-3 sets - 10-20 reps - Seated Long Arc Quad  - 2 x daily - 6 x weekly - 2-3 sets - 10-20 reps - Sit to Stand Without Arm Support  - 1-2 x daily - 6 x weekly - 1-2 sets - 10 reps - Standing Marching  - 1-2 x daily - 6 x weekly - 1-2 sets - 10-15 reps - Hip Abduction with Resistance Loop  - 1-2 x daily - 6 x weekly - 1-2 sets - 10-15 reps   ASSESSMENT:     ASSESSMENT:    CLINICAL IMPRESSION: She had good initial tolerance to exercise program  for ROM and strengthening of her Rt knee. We will work to improve her strength as tolerated while monitoring her soreness.    OBJECTIVE IMPAIRMENTS: decreased activity tolerance, difficulty walking, decreased balance, decreased endurance, decreased mobility, decreased ROM, decreased strength, impaired flexibility, impaired LE use, postural dysfunction, and pain.   ACTIVITY LIMITATIONS: bending, lifting, carry, locomotion, cleaning, community activity, driving, and or occupation   PERSONAL FACTORS: chronic pain and previous knee surgery are also affecting patient's functional outcome.   REHAB POTENTIAL: Fair to good   CLINICAL DECISION MAKING: Stable/uncomplicated   EVALUATION COMPLEXITY: Low       GOALS: Short term PT Goals Target date: 09/23/2021 Pt will be I and compliant with HEP. Baseline:  Goal status: New Pt will decrease pain by 25% overall Baseline: Goal status: New   Long term PT goals Target date: 10/21/2021 Pt will improve  hip/knee strength in sitting to at least 5-/5 MMT to improve functional strength Baseline: Goal status: New Pt will improve FOTO to at least 54% functional to show improved function Baseline: Goal status: New Pt will reduce pain by overall 50% overall with usual activity Baseline: Goal status: New Pt will be able to ambulate community distances at least 15 minutes without antalgic gait pattern Baseline: Goal status: New       5. Pt will improve 5 times sit to stand score to less than 10 seconds to show improved leg strength/endurance   PLAN: PT FREQUENCY: 1-2 times per week    PT DURATION: 6-8 weeks   PLANNED INTERVENTIONS (unless contraindicated): aquatic PT, Canalith repositioning, cryotherapy, Electrical stimulation, Iontophoresis with 4 mg/ml dexamethasome, Moist heat, traction, Ultrasound, gait training, Therapeutic exercise, balance training, neuromuscular  re-education, patient/family education, prosthetic training, manual techniques, passive ROM, dry needling, taping, vasopnuematic device, vestibular, spinal manipulations, joint manipulations   PLAN FOR NEXT SESSION: how is HEP going?, emphasize knee and hip strength to tolerance    Debbe Odea, PT,DPT 09/09/2021, 3:47 PM

## 2021-09-16 ENCOUNTER — Ambulatory Visit (INDEPENDENT_AMBULATORY_CARE_PROVIDER_SITE_OTHER): Payer: Medicare Other | Admitting: Physical Therapy

## 2021-09-16 ENCOUNTER — Encounter: Payer: Self-pay | Admitting: Physical Therapy

## 2021-09-16 DIAGNOSIS — M6281 Muscle weakness (generalized): Secondary | ICD-10-CM | POA: Diagnosis not present

## 2021-09-16 DIAGNOSIS — R262 Difficulty in walking, not elsewhere classified: Secondary | ICD-10-CM | POA: Diagnosis not present

## 2021-09-16 DIAGNOSIS — M25562 Pain in left knee: Secondary | ICD-10-CM

## 2021-09-16 DIAGNOSIS — R600 Localized edema: Secondary | ICD-10-CM | POA: Diagnosis not present

## 2021-09-16 DIAGNOSIS — G8929 Other chronic pain: Secondary | ICD-10-CM

## 2021-09-16 NOTE — Therapy (Signed)
OUTPATIENT PHYSICAL THERAPY TREATMENT NOTE   Patient Name: Jacqueline Berry MRN: [id number] DOB:Jul 14, 1960, 61 y.o., female Today's Date: 09/16/2021  END OF SESSION:   PT End of Session - 09/16/21 1523     Visit Number 3    Number of Visits 10    Date for PT Re-Evaluation 10/21/21    Authorization Type UHC MCR    PT Start Time 6962    PT Stop Time 9528    PT Time Calculation (min) 38 min    Activity Tolerance Patient tolerated treatment well    Behavior During Therapy WFL for tasks assessed/performed             Past Medical History:  Diagnosis Date   Bipolar disorder ([place])    Breast cancer ([place])    with chemo - 1997   Hypertension    Personal history of chemotherapy    Syphilis    Thyroid disease    Thyroid disease    Past Surgical History:  Procedure Laterality Date   BREAST BIOPSY  1997   MASTECTOMY     left breat mastectomy [place]   Patient Active Problem List   Diagnosis Date Noted   Acute medial meniscus tear of left knee 08/18/2021   Primary osteoarthritis of left knee 08/18/2021   Positive colorectal cancer screening using Cologuard test 06/11/2021   Insufficiency fracture of femur ([place]) 12/18/2020   Hyperlipidemia LDL goal <130 12/31/2019   Urinary incontinence 09/30/2017   Routine general medical examination at a health care facility 09/30/2017   Morbid obesity ([place]) 10/16/2009   Hypothyroidism 07/11/2009   Paranoid schizophrenia in remission ([place]) 06/24/2009   Essential hypertension 06/24/2009   HX, PERSONAL, MALIGNANCY, BREAST 12/07/2006    THERAPY DIAG:  Chronic pain of left knee  Muscle weakness (generalized)  Difficulty in walking, not elsewhere classified  Localized edema  Rationale for Evaluation and Treatment Rehabilitation PCP: Hoyt Koch, MD   REFERRING PROVIDER: Leandrew Koyanagi, MD   REFERRING DIAG: [phone] (ICD-10-CM) - Primary osteoarthritis of left knee   ONSET DATE: She says left knee  pain since June 2022, had left knee scope 12/18/20   SUBJECTIVE:    SUBJECTIVE STATEMENT: She relays the pain is not bad overall, had some pain with increased walking at the time but then it felt better. She has not been able to do the HEP much due to sleeping all the time because MD has her on 2 sleeping meds she reports.   PERTINENT HISTORY:  S/P left knee scope with debriedment of medial meniscus 12/18/20   PAIN:  Are you having pain? Yes: NPRS scale: 5/10 Pain location: left medial knee Pain description: dull pain Aggravating factors: walking more than 10  minutes, squatting Relieving factors: resting or sitting   PRECAUTIONS: None   WEIGHT BEARING RESTRICTIONS No   FALLS:  Has patient fallen in last 6 months? Yes 2-3 weeks ago, does not have fear of falling   OCCUPATION: security guard, sitting work   PLOF: Independent   PATIENT GOALS : get back to walking without a limp, reduce pain.     OBJECTIVE:    DIAGNOSTIC FINDINGS: XR 04/25/21 IMPRESSION: 1. There is a new, depressed subchondral fracture of the medial femoral condyle. 2. No macroscopic fracture or dislocation of the left knee. 3. Mild tricompartmental arthrosis. 4. Small, nonspecific knee joint effusion.   PATIENT SURVEYS:  08/26/21 FOTO 43% functional, goal is 54%   COGNITION:  Overall cognitive status: Within functional limits for tasks assessed                          SENSATION: WFL   MUSCLE LENGTH: Hamstrings: mild tightness Lt Thomas test: mild tightness Lt   POSTURE:  Slumped posture   PALPATION: Denies TTP   LE ROM:   Active ROM Right 08/26/2021 Left 08/26/2021  Hip flexion      Hip extension      Hip abduction      Hip adduction      Hip internal rotation      Hip external rotation      Knee flexion   130  Knee extension   0  Ankle dorsiflexion      Ankle plantarflexion      Ankle inversion      Ankle eversion       (Blank rows = not tested)   LE MMT:   MMT in  sitting Right 08/26/2021 Left 08/26/2021  Hip flexion   4+  Hip extension      Hip abduction   4+  Hip adduction      Hip internal rotation      Hip external rotation      Knee flexion   4  Knee extension   4  Ankle dorsiflexion      Ankle plantarflexion      Ankle inversion      Ankle eversion       (Blank rows = not tested)   LOWER EXTREMITY SPECIAL TESTS: negative varus and valgus stress test   FUNCTIONAL TESTS:  5 times sit to stand: 11.2 seconds no UE support   GAIT:           08/26/21 Distance walked: 100 Assistive device utilized: None Level of assistance: Complete Independence Comments: slower velocity, wider BOS, mildly antalgic gait       TODAY'S TREATMENT: 09/16/21 Nu step L5  X6 min UE/LE Leg press DL 75# 2X15, then left leg only 37# 2X15 Forward step ups 6 inch X 10 on left Seated knee extension machine DL 5# 3X10  Seated hamstring curl machine DL 25# 3X10 Standing hip abd red band X15 bilat Standing hip extension red band  X15 bilat Seated Hamstring stretch 30 sec X 2 bilat Seated SLR X15 bilat  09/09/21 Recumbent bike L3  X8 min Leg press DL 75# 2X15, then left leg only 37# 2X15 Forward step ups 6 inch X 10 on left Lateral step ups 6 inch X 10 bilat Seated LAQ X 20 5# bilat Standing H.S curls 5# X 15 bilat Standing hip abd 5#  X15 bilat Seated Hamstring stretch 30 sec X 3 on left Seated SLR 2X10 on left Supine quad stretch 3X30 sec on left     PATIENT EDUCATION:  Education details: HEP,PT plan of care Person educated: Patient Education method: Explanation, Demonstration, Verbal cues, and Handouts Education comprehension: verbalized understanding and needs further education     HOME EXERCISE PROGRAM: Access Code: [email or web address] URL: [email or web address] Date: 08/26/2021 Prepared by: Elsie Ra   Exercises - Seated Hamstring Stretch  - 1-2 x daily - 6 x weekly - 1 sets - 2 reps - 30 hold - Supine Quadriceps Stretch with Strap  on Table  - 1-2 x daily - 6 x weekly - 2 reps - 30 hold - Seated Straight Leg Raise with Quad Contraction  - 2 x daily - 6 x weekly - 2-3  sets - 10-20 reps - Seated Long Arc Quad  - 2 x daily - 6 x weekly - 2-3 sets - 10-20 reps - Sit to Stand Without Arm Support  - 1-2 x daily - 6 x weekly - 1-2 sets - 10 reps - Standing Marching  - 1-2 x daily - 6 x weekly - 1-2 sets - 10-15 reps - Hip Abduction with Resistance Loop  - 1-2 x daily - 6 x weekly - 1-2 sets - 10-15 reps   ASSESSMENT:     ASSESSMENT:   CLINICAL IMPRESSION: We progressed her overall strength program some with good overall tolerance noted. I encouraged her to try to perform HEP more to help with her overall progress.    OBJECTIVE IMPAIRMENTS: decreased activity tolerance, difficulty walking, decreased balance, decreased endurance, decreased mobility, decreased ROM, decreased strength, impaired flexibility, impaired LE use, postural dysfunction, and pain.   ACTIVITY LIMITATIONS: bending, lifting, carry, locomotion, cleaning, community activity, driving, and or occupation   PERSONAL FACTORS: chronic pain and previous knee surgery are also affecting patient's functional outcome.   REHAB POTENTIAL: Fair to good   CLINICAL DECISION MAKING: Stable/uncomplicated   EVALUATION COMPLEXITY: Low       GOALS: Short term PT Goals Target date: 09/23/2021 Pt will be I and compliant with HEP. Baseline:  Goal status: New Pt will decrease pain by 25% overall Baseline: Goal status: New   Long term PT goals Target date: 10/21/2021 Pt will improve  hip/knee strength in sitting to at least 5-/5 MMT to improve functional strength Baseline: Goal status: New Pt will improve FOTO to at least 54% functional to show improved function Baseline: Goal status: New Pt will reduce pain by overall 50% overall with usual activity Baseline: Goal status: New Pt will be able to ambulate community distances at least 15 minutes without antalgic gait  pattern Baseline: Goal status: New       5. Pt will improve 5 times sit to stand score to less than 10 seconds to show improved leg strength/endurance   PLAN: PT FREQUENCY: 1-2 times per week    PT DURATION: 6-8 weeks   PLANNED INTERVENTIONS (unless contraindicated): aquatic PT, Canalith repositioning, cryotherapy, Electrical stimulation, Iontophoresis with 4 mg/ml dexamethasome, Moist heat, traction, Ultrasound, gait training, Therapeutic exercise, balance training, neuromuscular re-education, patient/family education, prosthetic training, manual techniques, passive ROM, dry needling, taping, vasopnuematic device, vestibular, spinal manipulations, joint manipulations   PLAN FOR NEXT SESSION: emphasize knee and hip strength as tolerated    Debbe Odea, PT,DPT 09/16/2021, 3:33 PM

## 2021-09-21 ENCOUNTER — Telehealth: Payer: Self-pay | Admitting: Orthopaedic Surgery

## 2021-09-21 NOTE — Telephone Encounter (Signed)
Called and scheduled patient for next week with Mendel Ryder.

## 2021-09-21 NOTE — Telephone Encounter (Signed)
No reason.  Thanks.

## 2021-09-21 NOTE — Telephone Encounter (Signed)
Wants to know when she is due for her next cortisone shot...please advise

## 2021-09-21 NOTE — Telephone Encounter (Signed)
Patient received a Durolane injection in January. I do not see where she has received a cortisone injection since then. Any reason she can't follow up to get a cortisone now?

## 2021-09-23 ENCOUNTER — Encounter: Payer: Medicare Other | Admitting: Physical Therapy

## 2021-09-29 ENCOUNTER — Ambulatory Visit: Payer: Medicare Other | Admitting: Physician Assistant

## 2021-09-29 DIAGNOSIS — M1712 Unilateral primary osteoarthritis, left knee: Secondary | ICD-10-CM

## 2021-09-29 DIAGNOSIS — M25562 Pain in left knee: Secondary | ICD-10-CM

## 2021-09-29 MED ORDER — METHYLPREDNISOLONE ACETATE 40 MG/ML IJ SUSP
40.0000 mg | INTRAMUSCULAR | Status: AC | PRN
  Administered 2021-09-29: 40 mg via INTRA_ARTICULAR

## 2021-09-29 MED ORDER — LIDOCAINE HCL 1 % IJ SOLN
2.0000 mL | INTRAMUSCULAR | Status: AC | PRN
  Administered 2021-09-29: 2 mL

## 2021-09-29 MED ORDER — TRAMADOL HCL 50 MG PO TABS: 50.0000 mg | ORAL_TABLET | Freq: Two times a day (BID) | ORAL | 2 refills | Status: DC | PRN

## 2021-09-29 MED ORDER — BUPIVACAINE HCL 0.25 % IJ SOLN
2.0000 mL | INTRAMUSCULAR | Status: AC | PRN
  Administered 2021-09-29: 2 mL via INTRA_ARTICULAR

## 2021-09-29 NOTE — Progress Notes (Signed)
Office Visit Note   Patient: Jacqueline Berry           Date of Birth: [birth year]           MRN: [id number] Visit Date: 09/29/2021              Requested by: Hoyt Koch, MD [address] [place],  [place] [address] PCP: Hoyt Koch, MD   Assessment & Plan: Visit Diagnoses:  1. Unilateral primary osteoarthritis, left knee     Plan: Impression is left knee osteoarthritis.  Today, proceed with left knee cortisone injection.  She tolerated this well.  We have discussed eventual need for total knee arthroplasty but she is hoping to continue to treat this nonoperatively.  She will follow-up with [place] as needed.  Follow-Up Instructions: Return if symptoms worsen or fail to improve.   Orders:  Orders Placed This Encounter  Procedures   Large Joint Inj: L knee   Meds ordered this encounter  Medications   traMADol (ULTRAM) 50 MG tablet    Sig: Take 1 tablet (50 mg total) by mouth every 12 (twelve) hours as needed.    Dispense:  30 tablet    Refill:  2      Procedures: Large Joint Inj: L knee on 09/29/2021 3:49 PM Indications: pain Details: 22 G needle, anterolateral approach Medications: 2 mL lidocaine 1 %; 2 mL bupivacaine 0.25 %; 40 mg methylPREDNISolone acetate 40 MG/ML      Clinical Data: No additional findings.   Subjective: Chief Complaint  Patient presents with   Left Knee - Pain    HPI patient is a pleasant 61 year old female who comes in today with continued left knee pain.  History of left knee osteoarthritis.  She has previously undergone cortisone as well as viscosupplementation injection without significant relief.  She is trying to prolong knee replacement surgery.  She continues to have pain primarily to the medial knee worse with activity.  She has been taking Tylenol without significant relief.  Review of Systems as detailed in HPI.  All others reviewed and are negative.   Objective: Vital Signs: LMP 02/18/2011   Physical Exam  well-developed well-nourished female no acute distress.  Alert and oriented x3.  Ortho Exam left knee exam shows a small effusion.  Range of motion 0 to 125 degrees.  Medial joint line tenderness.  Marked patellofemoral crepitus.  She is neurovascular intact distally.  Specialty Comments:  No specialty comments available.  Imaging: No new imaging   PMFS History: Patient Active Problem List   Diagnosis Date Noted   Acute medial meniscus tear of left knee 08/18/2021   Primary osteoarthritis of left knee 08/18/2021   Positive colorectal cancer screening using Cologuard test 06/11/2021   Insufficiency fracture of femur ([place]) 12/18/2020   Hyperlipidemia LDL goal <130 12/31/2019   Urinary incontinence 09/30/2017   Routine general medical examination at a health care facility 09/30/2017   Morbid obesity ([place]) 10/16/2009   Hypothyroidism 07/11/2009   Paranoid schizophrenia in remission ([place]) 06/24/2009   Essential hypertension 06/24/2009   HX, PERSONAL, MALIGNANCY, BREAST 12/07/2006   Past Medical History:  Diagnosis Date   Bipolar disorder ([place])    Breast cancer ([place])    with chemo - 1997   Hypertension    Personal history of chemotherapy    Syphilis    Thyroid disease    Thyroid disease     Family History  Problem Relation Age of Onset   Cancer Mother        ?  type   Stroke Father    Breast cancer Sister        89's   Diabetes Brother    Coronary artery disease Brother    Colon cancer Neg Hx    Esophageal cancer Neg Hx    Rectal cancer Neg Hx    Stomach cancer Neg Hx     Past Surgical History:  Procedure Laterality Date   BREAST BIOPSY  1997   MASTECTOMY     left breat mastectomy [place]   Social History   Occupational History   Not on file  Tobacco Use   Smoking status: Former    Packs/day: 0.50    Years: 37.00    Total pack years: 18.50    Types: Cigarettes   Smokeless tobacco: Never  Vaping Use   Vaping Use: Former   Substance and Sexual Activity   Alcohol use: No    Comment: Pt denies   Drug use: No    Comment: Pt denies    Sexual activity: Yes    Birth control/protection: Surgical    Comment: 1st intercourse 61 yo-More than 5 partners

## 2021-09-30 ENCOUNTER — Encounter: Payer: Self-pay | Admitting: Physical Therapy

## 2021-09-30 ENCOUNTER — Ambulatory Visit: Payer: Medicare Other | Admitting: Physical Therapy

## 2021-09-30 DIAGNOSIS — M25562 Pain in left knee: Secondary | ICD-10-CM | POA: Diagnosis not present

## 2021-09-30 DIAGNOSIS — R600 Localized edema: Secondary | ICD-10-CM

## 2021-09-30 DIAGNOSIS — R262 Difficulty in walking, not elsewhere classified: Secondary | ICD-10-CM | POA: Diagnosis not present

## 2021-09-30 DIAGNOSIS — G8929 Other chronic pain: Secondary | ICD-10-CM | POA: Diagnosis not present

## 2021-09-30 DIAGNOSIS — M6281 Muscle weakness (generalized): Secondary | ICD-10-CM

## 2021-09-30 NOTE — Therapy (Signed)
OUTPATIENT PHYSICAL THERAPY TREATMENT NOTE/Discharge PHYSICAL THERAPY DISCHARGE SUMMARY  Visits from Start of Care: 4  Current functional level related to goals / functional outcomes: See below   Remaining deficits: See below   Education / Equipment: HEP  Plan: Patient agrees to discharge.  Patient goals were mostly met. Patient is being discharged due to her request, she feels ready to transition to HEP.       Patient Name: Jacqueline Berry MRN: [id number] DOB:1961-01-26, 61 y.o., female Today's Date: 09/30/2021  END OF SESSION:   PT End of Session - 09/30/21 1501     Visit Number 4    Number of Visits 10    Date for PT Re-Evaluation 10/21/21    Authorization Type UHC MCR    PT Start Time 1500    PT Stop Time 3785    PT Time Calculation (min) 38 min    Activity Tolerance Patient tolerated treatment well    Behavior During Therapy WFL for tasks assessed/performed             Past Medical History:  Diagnosis Date   Bipolar disorder ([place])    Breast cancer ([place])    with chemo - 1997   Hypertension    Personal history of chemotherapy    Syphilis    Thyroid disease    Thyroid disease    Past Surgical History:  Procedure Laterality Date   BREAST BIOPSY  1997   MASTECTOMY     left breat mastectomy [place]   Patient Active Problem List   Diagnosis Date Noted   Acute medial meniscus tear of left knee 08/18/2021   Primary osteoarthritis of left knee 08/18/2021   Positive colorectal cancer screening using Cologuard test 06/11/2021   Insufficiency fracture of femur ([place]) 12/18/2020   Hyperlipidemia LDL goal <130 12/31/2019   Urinary incontinence 09/30/2017   Routine general medical examination at a health care facility 09/30/2017   Morbid obesity ([place]) 10/16/2009   Hypothyroidism 07/11/2009   Paranoid schizophrenia in remission ([place]) 06/24/2009   Essential hypertension 06/24/2009   HX, PERSONAL, MALIGNANCY, BREAST 12/07/2006     THERAPY DIAG:  Chronic pain of left knee  Muscle weakness (generalized)  Difficulty in walking, not elsewhere classified  Localized edema  Rationale for Evaluation and Treatment Rehabilitation PCP: Hoyt Koch, MD   REFERRING PROVIDER: Leandrew Koyanagi, MD   REFERRING DIAG: [id number] (ICD-10-CM) - Primary osteoarthritis of left knee   ONSET DATE: She says left knee pain since June 2022, had left knee scope 12/18/20   SUBJECTIVE:    SUBJECTIVE STATEMENT: She relays she had a set back 2 weeks ago and felt more pain and difficulty walking. She since has had injection which helped her knee. She says she feels ready to discharge from PT today. She is limited with standing or walking but this is now more because of her back and not her knee.  PERTINENT HISTORY:  S/P left knee scope with debriedment of medial meniscus 12/18/20   PAIN:  Are you having pain? Yes: NPRS scale: 2/10 upon arrival Pain location: left medial knee Pain description: dull pain Aggravating factors: walking more than 10  minutes, squatting Relieving factors: resting or sitting   PRECAUTIONS: None   WEIGHT BEARING RESTRICTIONS No   FALLS:  Has patient fallen in last 6 months? Yes 2-3 weeks ago, does not have fear of falling   OCCUPATION: security guard, sitting work   PLOF: Independent  PATIENT GOALS : get back to walking without a limp, reduce pain.     OBJECTIVE:    DIAGNOSTIC FINDINGS: XR 04/25/21 IMPRESSION: 1. There is a new, depressed subchondral fracture of the medial femoral condyle. 2. No macroscopic fracture or dislocation of the left knee. 3. Mild tricompartmental arthrosis. 4. Small, nonspecific knee joint effusion.   PATIENT SURVEYS:  08/26/21 FOTO 43% functional, goal is 54% 09/30/21: Improved to 52%   COGNITION:           Overall cognitive status: Within functional limits for tasks assessed                          SENSATION: [facility]   MUSCLE LENGTH: Hamstrings: mild  tightness Lt Thomas test: mild tightness Lt   POSTURE:  Slumped posture   PALPATION: Denies TTP   LE ROM:   Active ROM Right 08/26/2021 Left 08/26/2021  Hip flexion      Hip extension      Hip abduction      Hip adduction      Hip internal rotation      Hip external rotation      Knee flexion   130  Knee extension   0  Ankle dorsiflexion      Ankle plantarflexion      Ankle inversion      Ankle eversion       (Blank rows = not tested)   LE MMT:   MMT in sitting Right 08/26/2021 Left 08/26/2021 Left 09/30/21  Hip flexion   4 5  Hip extension       Hip abduction   4 5  Hip adduction       Hip internal rotation       Hip external rotation       Knee flexion   4 5  Knee extension   4 5  Ankle dorsiflexion       Ankle plantarflexion       Ankle inversion       Ankle eversion        (Blank rows = not tested)   LOWER EXTREMITY SPECIAL TESTS: negative varus and valgus stress test   FUNCTIONAL TESTS:  Eval: 5 times sit to stand: 11.2 seconds no UE support 09/30/21: 5 times sit to stand test 9.5 seconds without UE support   GAIT:           08/26/21 Distance walked: 100 Assistive device utilized: None Level of assistance: Complete Independence Comments: slower velocity, wider BOS, mildly antalgic gait       TODAY'S TREATMENT: 09/30/21 Nu step L5  X6 min ULE Leg press DL 75# 2X15, then left leg only 37# 2X15 Forward step ups with left leg and down in front with right leg  6 inch X 10  Seated knee extension machine DL 10# 3X10  Seated hamstring curl machine DL 25# 3X10 Standing hip abd red band X15 bilat Standing hip extension red band  X15 bilat 5 times sit to stand test, see above for details.  09/16/21 Nu step L5  X6 min UE/LE Leg press DL 75# 2X15, then left leg only 37# 2X15 Forward step ups 6 inch X 10 on left Seated knee extension machine DL 5# 3X10  Seated hamstring curl machine DL 25# 3X10 Standing hip abd red band X15 bilat Standing hip extension  red band  X15 bilat Seated Hamstring stretch 30 sec X 2 bilat Seated SLR X15 bilat  09/09/21 Recumbent bike L3  X8 min Leg press DL 75# 2X15, then left leg only 37# 2X15 Forward step ups 6 inch X 10 on left Lateral step ups 6 inch X 10 bilat Seated LAQ X 20 5# bilat Standing H.S curls 5# X 15 bilat Standing hip abd 5#  X15 bilat Seated Hamstring stretch 30 sec X 3 on left Seated SLR 2X10 on left Supine quad stretch 3X30 sec on left     PATIENT EDUCATION:  Education details: HEP,PT plan of care Person educated: Patient Education method: Explanation, Demonstration, Verbal cues, and Handouts Education comprehension: verbalized understanding and needs further education     HOME EXERCISE PROGRAM: Access Code: [email or web address] URL: [email or web address] Date: 08/26/2021 Prepared by: Elsie Ra   Exercises - Seated Hamstring Stretch  - 1-2 x daily - 6 x weekly - 1 sets - 2 reps - 30 hold - Supine Quadriceps Stretch with Strap on Table  - 1-2 x daily - 6 x weekly - 2 reps - 30 hold - Seated Straight Leg Raise with Quad Contraction  - 2 x daily - 6 x weekly - 2-3 sets - 10-20 reps - Seated Long Arc Quad  - 2 x daily - 6 x weekly - 2-3 sets - 10-20 reps - Sit to Stand Without Arm Support  - 1-2 x daily - 6 x weekly - 1-2 sets - 10 reps - Standing Marching  - 1-2 x daily - 6 x weekly - 1-2 sets - 10-15 reps - Hip Abduction with Resistance Loop  - 1-2 x daily - 6 x weekly - 1-2 sets - 10-15 reps   ASSESSMENT:     ASSESSMENT:   CLINICAL IMPRESSION:  She has made good progress with PT and has mostly met her PT goals. Pain is better controlled after injection but still with OA pain at times and she is still limited functionally by back pain more so than knee pain. Her Knee strength and ROM are now satisfactory and she feels ready to discharge to independent program. She had no further questions or concerns.    OBJECTIVE IMPAIRMENTS: decreased activity tolerance, difficulty  walking, decreased balance, decreased endurance, decreased mobility, decreased ROM, decreased strength, impaired flexibility, impaired LE use, postural dysfunction, and pain.   ACTIVITY LIMITATIONS: bending, lifting, carry, locomotion, cleaning, community activity, driving, and or occupation   PERSONAL FACTORS: chronic pain and previous knee surgery are also affecting patient's functional outcome.   REHAB POTENTIAL: Fair to good   CLINICAL DECISION MAKING: Stable/uncomplicated   EVALUATION COMPLEXITY: Low       GOALS: Short term PT Goals Target date: 09/23/2021 Pt will be I and compliant with HEP. Baseline:  Goal status: MET 6/14 Pt will decrease pain by 25% overall Baseline: Goal status: MET 6/14   Long term PT goals Target date: 10/21/2021 Pt will improve  hip/knee strength in sitting to at least 5-/5 MMT to improve functional strength Baseline: Goal status: MET 6/14 Pt will improve FOTO to at least 54% functional to show improved function Baseline: Goal status: partially met, improved to 52% Pt will reduce pain by overall 50% overall with usual activity Baseline: Goal status:ongoing, limited by back pain  Pt will be able to ambulate community distances at least 15 minutes without antalgic gait pattern Baseline: Goal status: Partially met, limited by back pain more than knee pain       5. Pt will improve 5 times sit to stand score to less than 10 seconds to show improved leg  strength/endurance   Goal status: MET improved to 9.5 sec on 09/30/21   PLAN: PT FREQUENCY: 1-2 times per week    PT DURATION: 6-8 weeks   PLANNED INTERVENTIONS (unless contraindicated): aquatic PT, Canalith repositioning, cryotherapy, Electrical stimulation, Iontophoresis with 4 mg/ml dexamethasome, Moist heat, traction, Ultrasound, gait training, Therapeutic exercise, balance training, neuromuscular re-education, patient/family education, prosthetic training, manual techniques, passive ROM, dry  needling, taping, vasopnuematic device, vestibular, spinal manipulations, joint manipulations   PLAN FOR NEXT SESSION: DC today    Debbe Odea, PT,DPT 09/30/2021, 3:06 PM

## 2021-11-03 ENCOUNTER — Other Ambulatory Visit: Payer: Self-pay | Admitting: Internal Medicine

## 2021-11-03 DIAGNOSIS — E039 Hypothyroidism, unspecified: Secondary | ICD-10-CM

## 2021-11-17 ENCOUNTER — Encounter: Payer: Self-pay | Admitting: Orthopaedic Surgery

## 2021-11-17 ENCOUNTER — Ambulatory Visit: Payer: Medicare Other | Admitting: Orthopaedic Surgery

## 2021-11-17 DIAGNOSIS — M1712 Unilateral primary osteoarthritis, left knee: Secondary | ICD-10-CM

## 2021-11-17 DIAGNOSIS — Z9889 Other specified postprocedural states: Secondary | ICD-10-CM

## 2021-11-17 DIAGNOSIS — S83242A Other tear of medial meniscus, current injury, left knee, initial encounter: Secondary | ICD-10-CM

## 2021-11-17 NOTE — Progress Notes (Signed)
Office Visit Note   Patient: Jacqueline Berry           Date of Birth: Aug 28, 1960           MRN: [id number] Visit Date: 11/17/2021              Requested by: Hoyt Koch, MD [address] [place],  [place] [address] PCP: Hoyt Koch, MD   Assessment & Plan: Visit Diagnoses:  1. Primary osteoarthritis of left knee   2. S/P arthroscopy of left knee   3. Acute medial meniscus tear of left knee, initial encounter     Plan: Elli returns today for left knee OA.  She thought it was time for another cortisone injection and she states that she is not actually having pain in her knee.  We will just have her come back when the shot wears off and she needs another injection.  Follow-Up Instructions: No follow-ups on file.   Orders:  No orders of the defined types were placed in this encounter.  No orders of the defined types were placed in this encounter.     Procedures: No procedures performed   Clinical Data: No additional findings.   Subjective: Chief Complaint  Patient presents with   Left Knee - Pain    HPI  Review of Systems   Objective: Vital Signs: LMP 02/18/2011   Physical Exam  Ortho Exam  Specialty Comments:  No specialty comments available.  Imaging: No results found.   PMFS History: Patient Active Problem List   Diagnosis Date Noted   S/P arthroscopy of left knee 11/17/2021   Acute medial meniscus tear of left knee 08/18/2021   Primary osteoarthritis of left knee 08/18/2021   Positive colorectal cancer screening using Cologuard test 06/11/2021   Insufficiency fracture of femur ([place]) 12/18/2020   Hyperlipidemia LDL goal <130 12/31/2019   Urinary incontinence 09/30/2017   Routine general medical examination at a health care facility 09/30/2017   Morbid obesity ([place]) 10/16/2009   Hypothyroidism 07/11/2009   Paranoid schizophrenia in remission ([place]) 06/24/2009   Essential hypertension 06/24/2009   HX, PERSONAL,  MALIGNANCY, BREAST 12/07/2006   Past Medical History:  Diagnosis Date   Bipolar disorder ([place])    Breast cancer ([place])    with chemo - 1997   Hypertension    Personal history of chemotherapy    Syphilis    Thyroid disease    Thyroid disease     Family History  Problem Relation Age of Onset   Cancer Mother        ? type   Stroke Father    Breast cancer Sister        24's   Diabetes Brother    Coronary artery disease Brother    Colon cancer Neg Hx    Esophageal cancer Neg Hx    Rectal cancer Neg Hx    Stomach cancer Neg Hx     Past Surgical History:  Procedure Laterality Date   BREAST BIOPSY  1997   MASTECTOMY     left breat mastectomy [place]   Social History   Occupational History   Not on file  Tobacco Use   Smoking status: Former    Packs/day: 0.50    Years: 37.00    Total pack years: 18.50    Types: Cigarettes   Smokeless tobacco: Never  Vaping Use   Vaping Use: Former  Substance and Sexual Activity  Alcohol use: No    Comment: Pt denies   Drug use: No    Comment: Pt denies    Sexual activity: Yes    Birth control/protection: Surgical    Comment: 1st intercourse 61 yo-More than 5 partners

## 2021-12-17 ENCOUNTER — Other Ambulatory Visit: Payer: Self-pay | Admitting: Physician Assistant

## 2021-12-31 ENCOUNTER — Ambulatory Visit (INDEPENDENT_AMBULATORY_CARE_PROVIDER_SITE_OTHER): Payer: Medicare Other

## 2021-12-31 ENCOUNTER — Ambulatory Visit: Payer: Medicare Other | Admitting: Orthopaedic Surgery

## 2021-12-31 DIAGNOSIS — M1712 Unilateral primary osteoarthritis, left knee: Secondary | ICD-10-CM

## 2021-12-31 MED ORDER — BUPIVACAINE HCL 0.5 % IJ SOLN
2.0000 mL | INTRAMUSCULAR | Status: AC | PRN
  Administered 2021-12-31: 2 mL via INTRA_ARTICULAR

## 2021-12-31 MED ORDER — METHYLPREDNISOLONE ACETATE 40 MG/ML IJ SUSP
40.0000 mg | INTRAMUSCULAR | Status: AC | PRN
  Administered 2021-12-31: 40 mg via INTRA_ARTICULAR

## 2021-12-31 MED ORDER — LIDOCAINE HCL 1 % IJ SOLN
2.0000 mL | INTRAMUSCULAR | Status: AC | PRN
  Administered 2021-12-31: 2 mL

## 2021-12-31 NOTE — Progress Notes (Signed)
Office Visit Note   Patient: Jacqueline Berry           Date of Birth: February 02, 1961           MRN: [id number] Visit Date: 12/31/2021              Requested by: Hoyt Koch, MD [address] [place],  [place] [address] PCP: Hoyt Koch, MD   Assessment & Plan: Visit Diagnoses:  1. Primary osteoarthritis of left knee     Plan: Impression is left knee osteoarthritis.  Today, we again discussed various treatment options to include cortisone injection versus total knee arthroplasty.  She would like to continue getting cortisone injections for now.  She will follow-up with [place] as needed.  Call with concerns or questions.  Follow-Up Instructions: Return if symptoms worsen or fail to improve.   Orders:  Orders Placed This Encounter  Procedures   Large Joint Inj: L knee   XR KNEE 3 VIEW LEFT   No orders of the defined types were placed in this encounter.     Procedures: Large Joint Inj: L knee on 12/31/2021 8:26 PM Details: 22 G needle Medications: 2 mL bupivacaine 0.5 %; 2 mL lidocaine 1 %; 40 mg methylPREDNISolone acetate 40 MG/ML Outcome: tolerated well, no immediate complications Patient was prepped and draped in the usual sterile fashion.       Clinical Data: No additional findings.   Subjective: Chief Complaint  Patient presents with   Left Knee - Pain    HPI patient is a pleasant 61 year old female who comes in today with recurrent left knee pain.  She has underlying osteoarthritis and has been getting intermittent cortisone injections in the past with decent relief in symptoms.  Her last cortisone injection was in early June of this year which provided about 2 months relief.  Her symptoms have gradually started to return.  The pain is to the medial aspect.  Worse with walking.  She has been taking Tylenol arthritis without significant relief.  Review of Systems as detailed in HPI.  All others reviewed and are negative.   Objective: Vital  Signs: LMP 02/18/2011   Physical Exam well-developed well-nourished female in no acute distress.  Alert and oriented x3.  Ortho Exam left knee exam shows trace effusion.  Range of motion 0 to 110 degrees.  No joint line tenderness.  Marked patellofemoral crepitus.  She is neurovascular tact distally.  Specialty Comments:  No specialty comments available.  Imaging: No new imaging   PMFS History: Patient Active Problem List   Diagnosis Date Noted   S/P arthroscopy of left knee 11/17/2021   Acute medial meniscus tear of left knee 08/18/2021   Primary osteoarthritis of left knee 08/18/2021   Positive colorectal cancer screening using Cologuard test 06/11/2021   Insufficiency fracture of femur ([place]) 12/18/2020   Hyperlipidemia LDL goal <130 12/31/2019   Urinary incontinence 09/30/2017   Routine general medical examination at a health care facility 09/30/2017   Morbid obesity ([place]) 10/16/2009   Hypothyroidism 07/11/2009   Paranoid schizophrenia in remission ([place]) 06/24/2009   Essential hypertension 06/24/2009   HX, PERSONAL, MALIGNANCY, BREAST 12/07/2006   Past Medical History:  Diagnosis Date   Bipolar disorder ([place])    Breast cancer ([place])    with chemo - 1997   Hypertension    Personal history of chemotherapy    Syphilis    Thyroid disease    Thyroid disease     Family History  Problem Relation Age of Onset   Cancer Mother        ? type   Stroke Father    Breast cancer Sister        53's   Diabetes Brother    Coronary artery disease Brother    Colon cancer Neg Hx    Esophageal cancer Neg Hx    Rectal cancer Neg Hx    Stomach cancer Neg Hx     Past Surgical History:  Procedure Laterality Date   BREAST BIOPSY  1997   MASTECTOMY     left breat mastectomy [place]   Social History   Occupational History   Not on file  Tobacco Use   Smoking status: Former    Packs/day: 0.50    Years: 37.00    Total pack years: 18.50    Types:  Cigarettes   Smokeless tobacco: Never  Vaping Use   Vaping Use: Former  Substance and Sexual Activity   Alcohol use: No    Comment: Pt denies   Drug use: No    Comment: Pt denies    Sexual activity: Yes    Birth control/protection: Surgical    Comment: 1st intercourse 61 yo-More than 5 partners

## 2022-01-21 ENCOUNTER — Other Ambulatory Visit: Payer: Self-pay | Admitting: Internal Medicine

## 2022-01-30 ENCOUNTER — Other Ambulatory Visit: Payer: Self-pay | Admitting: Internal Medicine

## 2022-02-02 ENCOUNTER — Encounter: Payer: Medicare Other | Admitting: Internal Medicine

## 2022-02-17 ENCOUNTER — Encounter: Payer: Self-pay | Admitting: Internal Medicine

## 2022-02-17 ENCOUNTER — Ambulatory Visit (INDEPENDENT_AMBULATORY_CARE_PROVIDER_SITE_OTHER): Payer: Medicare Other | Admitting: Internal Medicine

## 2022-02-17 VITALS — BP 120/82 | HR 89 | Temp 98.2°F | Ht 63.0 in | Wt 200.0 lb

## 2022-02-17 DIAGNOSIS — I10 Essential (primary) hypertension: Secondary | ICD-10-CM

## 2022-02-17 DIAGNOSIS — E039 Hypothyroidism, unspecified: Secondary | ICD-10-CM | POA: Diagnosis not present

## 2022-02-17 DIAGNOSIS — E785 Hyperlipidemia, unspecified: Secondary | ICD-10-CM

## 2022-02-17 DIAGNOSIS — Z0000 Encounter for general adult medical examination without abnormal findings: Secondary | ICD-10-CM | POA: Diagnosis not present

## 2022-02-17 DIAGNOSIS — F200 Paranoid schizophrenia: Secondary | ICD-10-CM

## 2022-02-17 LAB — COMPREHENSIVE METABOLIC PANEL
ALT: 22 U/L (ref 0–35)
AST: 18 U/L (ref 0–37)
Albumin: 4.3 g/dL (ref 3.5–5.2)
Alkaline Phosphatase: 98 U/L (ref 39–117)
BUN: 11 mg/dL (ref 6–23)
CO2: 32 mEq/L (ref 19–32)
Calcium: 9.9 mg/dL (ref 8.4–10.5)
Chloride: 102 mEq/L (ref 96–112)
Creatinine, Ser: 0.74 mg/dL (ref 0.40–1.20)
GFR: 86.85 mL/min (ref >60.00)
Glucose, Bld: 113 mg/dL — ABNORMAL HIGH (ref 70–99)
Potassium: 3.6 mEq/L (ref 3.5–5.1)
Sodium: 139 mEq/L (ref 135–145)
Total Bilirubin: 0.4 mg/dL (ref 0.2–1.2)
Total Protein: 7.7 g/dL (ref 6.0–8.3)

## 2022-02-17 LAB — CBC
HCT: 39.7 % (ref 36.0–46.0)
Hemoglobin: 13 g/dL (ref 12.0–15.0)
MCHC: 32.7 g/dL (ref 30.0–36.0)
MCV: 84.9 fl (ref 78.0–100.0)
Platelets: 311 10*3/uL (ref 150.0–400.0)
RBC: 4.68 Mil/uL (ref 3.87–5.11)
RDW: 15 % (ref 11.5–15.5)
WBC: 7.4 10*3/uL (ref 4.0–10.5)

## 2022-02-17 LAB — T4, FREE: Free T4: 1 ng/dL (ref 0.60–1.60)

## 2022-02-17 LAB — LIPID PANEL
Cholesterol: 144 mg/dL (ref 0–200)
HDL: 36.2 mg/dL — ABNORMAL LOW (ref >39.00)
LDL Cholesterol: 81 mg/dL (ref 0–99)
NonHDL: 107.68
Total CHOL/HDL Ratio: 4
Triglycerides: 132 mg/dL (ref 0.0–149.0)
VLDL: 26.4 mg/dL (ref 0.0–40.0)

## 2022-02-17 LAB — HEMOGLOBIN A1C: Hgb A1c MFr Bld: 6.2 % (ref 4.6–6.5)

## 2022-02-17 LAB — TSH: TSH: 2.26 u[IU]/mL (ref 0.35–5.50)

## 2022-02-17 NOTE — Assessment & Plan Note (Signed)
Checking lipid panel and adjust as needed. Not on medication currently due to some side effects.

## 2022-02-17 NOTE — Progress Notes (Unsigned)
Subjective:   Patient ID: Jacqueline Berry, female    DOB: Sep 08, 1960, 61 y.o.   MRN: [id number]  HPI Here for medicare wellness and physical, no new complaints. Please see A/P for status and treatment of chronic medical problems.   Diet: heart healthy Physical activity: sedentary Depression/mood screen: negative Hearing: intact to whispered voice Visual acuity: grossly normal, poor night vision, performs annual eye exam  ADLs: capable Fall risk: none Home safety: good Cognitive evaluation: intact to orientation, naming, recall and repetition EOL planning: adv directives discussed, in [place] Office Visit from 02/17/2022 in [place] at [place] Office Visit from 02/17/2022 in [place] at [facility]  PHQ-9 Total Score 0         03/03/2018    1:41 PM 06/03/2020    4:09 PM 10/23/2020    8:24 AM 04/25/2021    2:00 PM 02/17/2022   10:42 AM  [place] in the past year? 1 0   0  Was there an injury with Fall? 1 0   0  Fall Risk Category Calculator 2 0   0  Fall Risk Category Moderate Low   Low  Patient Fall Risk Level  Low fall risk Low fall risk Moderate fall risk   Patient at Risk for Falls Due to  No Fall Risks       I have personally reviewed and have noted 1. The patient's medical and social history - reviewed today no changes 2. Their use of alcohol, tobacco or illicit drugs 3. Their current medications and supplements 4. The patient's functional ability including ADL's, fall risks, home safety risks and hearing or visual impairment. 5. Diet and physical activities 6. Evidence for depression or mood disorders 7. Care team reviewed and updated 8.  The patient is not on an opioid pain medication.  Patient Care Team: Hoyt Koch, MD as PCP - General (Internal Medicine) Rogers Blocker, MD (Internal Medicine) Charlton Haws, [facility] as Pharmacist (Pharmacist) Past Medical  History:  Diagnosis Date   Bipolar disorder [facility])    Breast cancer [facility])    with chemo - 1997   Hypertension    Personal history of chemotherapy    Syphilis    Thyroid disease    Thyroid disease    Past Surgical History:  Procedure Laterality Date   BREAST BIOPSY  1997   MASTECTOMY     left breat mastectomy [place]   Family History  Problem Relation Age of Onset   Cancer Mother        ? type   Stroke Father    Breast cancer Sister        97's   Diabetes Brother    Coronary artery disease Brother    Colon cancer Neg Hx    Esophageal cancer Neg Hx    Rectal cancer Neg Hx    Stomach cancer Neg Hx     Review of Systems  Constitutional:  Positive for fatigue and unexpected weight change.  HENT: Negative.    Eyes: Negative.   Respiratory:  Negative for cough, chest tightness and shortness of breath.   Cardiovascular:  Negative for chest pain, palpitations and leg swelling.  Gastrointestinal:  Negative for abdominal distention, abdominal pain, constipation, diarrhea, nausea and vomiting.  Musculoskeletal: Negative.   Skin: Negative.   Neurological: Negative.  Psychiatric/Behavioral: Negative.      Objective:  Physical Exam Constitutional:      Appearance: She is well-developed.  HENT:     Head: Normocephalic and atraumatic.  Cardiovascular:     Rate and Rhythm: Normal rate and regular rhythm.  Pulmonary:     Effort: Pulmonary effort is normal. No respiratory distress.     Breath sounds: Normal breath sounds. No wheezing or rales.  Abdominal:     General: Bowel sounds are normal. There is no distension.     Palpations: Abdomen is soft.     Tenderness: There is no abdominal tenderness. There is no rebound.  Musculoskeletal:     Cervical back: Normal range of motion.  Skin:    General: Skin is warm and dry.  Neurological:     Mental Status: She is alert and oriented to person, place, and time.     Coordination: Coordination normal.      Vitals:   02/17/22 1045 02/17/22 1107  BP: (!) 130/90 120/82  Pulse: 89   Temp: 98.2 F (36.8 C)   TempSrc: Oral   SpO2: 94%   Weight: 200 lb (90.7 kg)   Height: '5\' 3"'$  (1.6 m)     Assessment & Plan:

## 2022-02-17 NOTE — Assessment & Plan Note (Signed)
BMI 35 complicated by hyperlipidemia and hypertension.

## 2022-02-17 NOTE — Assessment & Plan Note (Addendum)
BMI 35 and complicated by hypertension and hyperlipidemia.

## 2022-02-18 ENCOUNTER — Telehealth: Payer: Self-pay | Admitting: Internal Medicine

## 2022-02-18 ENCOUNTER — Encounter: Payer: Self-pay | Admitting: Internal Medicine

## 2022-02-18 NOTE — Assessment & Plan Note (Signed)
Flu shot declines. Pneumonia declines. Shingrix declines. Tetanus due 2029. Colonoscopy due 2030. Mammogram due 2024, pap smear aged out and dexa complete. Counseled about sun safety and mole surveillance. Counseled about the dangers of distracted driving. Given 10 year screening recommendations.

## 2022-02-18 NOTE — Assessment & Plan Note (Signed)
Checking TSH and adjust as needed her synthroid 100 mcg daily.

## 2022-02-18 NOTE — Assessment & Plan Note (Signed)
BP at goal on current regimen amlodipine 10 mg daily and lisinopril/hctz 20/12.5 mg daily. Checking CMP and CBC and lipid panel. Adjust as needed.

## 2022-02-18 NOTE — Telephone Encounter (Signed)
Pt requesting Prosthetic bra RX to [facility] on [address] .  Pt phone [phone]

## 2022-02-18 NOTE — Assessment & Plan Note (Signed)
Overall stable and seeing mental health for management of her haldol and cogentin.

## 2022-02-19 NOTE — Telephone Encounter (Signed)
Usually they have a specific form they fax [place]. Can they send [place] that or will they take a paper rx?

## 2022-02-22 NOTE — Telephone Encounter (Signed)
[facility] [address] [place], [place], [place] [address] Phone [phone]  Called pt to confirm. LVM.

## 2022-02-23 ENCOUNTER — Telehealth: Payer: Self-pay | Admitting: Internal Medicine

## 2022-02-23 DIAGNOSIS — R7303 Prediabetes: Secondary | ICD-10-CM

## 2022-02-23 NOTE — Telephone Encounter (Signed)
Patient called back and said she had received a mychart message from Dr Sharlet Salina asking if she wanted to see a nutritionist, She said she does

## 2022-02-24 NOTE — Telephone Encounter (Signed)
Called [facility] and lvm

## 2022-02-24 NOTE — Telephone Encounter (Signed)
Called the

## 2022-02-24 NOTE — Telephone Encounter (Signed)
Found Documentation for prescription from [facility] and this was faxed over on 12/29/2021. Re faxed over 02/24/2022

## 2022-02-25 ENCOUNTER — Other Ambulatory Visit: Payer: Self-pay | Admitting: Internal Medicine

## 2022-02-25 DIAGNOSIS — E039 Hypothyroidism, unspecified: Secondary | ICD-10-CM

## 2022-02-25 DIAGNOSIS — R7303 Prediabetes: Secondary | ICD-10-CM | POA: Insufficient documentation

## 2022-02-25 NOTE — Telephone Encounter (Signed)
Spoke with patient to let her know about her referral and she stated that she has already gotten her appointment scheduled with them

## 2022-02-25 NOTE — Telephone Encounter (Signed)
Done referral

## 2022-03-16 ENCOUNTER — Ambulatory Visit: Payer: Medicare Other | Admitting: Orthopaedic Surgery

## 2022-03-23 ENCOUNTER — Ambulatory Visit: Payer: Medicare Other | Admitting: Orthopaedic Surgery

## 2022-03-25 ENCOUNTER — Ambulatory Visit (INDEPENDENT_AMBULATORY_CARE_PROVIDER_SITE_OTHER): Payer: Medicare Other | Admitting: Orthopaedic Surgery

## 2022-03-25 DIAGNOSIS — M1712 Unilateral primary osteoarthritis, left knee: Secondary | ICD-10-CM

## 2022-03-25 MED ORDER — METHYLPREDNISOLONE ACETATE 40 MG/ML IJ SUSP
40.0000 mg | INTRAMUSCULAR | Status: AC | PRN
  Administered 2022-03-25: 40 mg via INTRA_ARTICULAR

## 2022-03-25 MED ORDER — BUPIVACAINE HCL 0.5 % IJ SOLN
2.0000 mL | INTRAMUSCULAR | Status: AC | PRN
  Administered 2022-03-25: 2 mL via INTRA_ARTICULAR

## 2022-03-25 MED ORDER — LIDOCAINE HCL 1 % IJ SOLN
2.0000 mL | INTRAMUSCULAR | Status: AC | PRN
  Administered 2022-03-25: 2 mL

## 2022-03-25 NOTE — Progress Notes (Signed)
Office Visit Note   Patient: Jacqueline Berry           Date of Birth: 1960-08-05           MRN: [id number] Visit Date: 03/25/2022              Requested by: Hoyt Koch, MD [address] [place],  [place] [address] PCP: Hoyt Koch, MD   Assessment & Plan: Visit Diagnoses:  1. Unilateral primary osteoarthritis, left knee     Plan: Impression is left knee arthritis flareup.  We again discussed various treatment options to include repeat cortisone injection versus total knee arthroplasty.  She is unable to take time off of work to proceed with surgery at this point so she would like to continue with injections.  She will follow-up with [place] as needed.  Follow-Up Instructions: Return if symptoms worsen or fail to improve.   Orders:  No orders of the defined types were placed in this encounter.  No orders of the defined types were placed in this encounter.     Procedures: Large Joint Inj: L knee on 03/25/2022 10:47 AM Details: 22 G needle Medications: 2 mL bupivacaine 0.5 %; 2 mL lidocaine 1 %; 40 mg methylPREDNISolone acetate 40 MG/ML Outcome: tolerated well, no immediate complications Patient was prepped and draped in the usual sterile fashion.       Clinical Data: No additional findings.   Subjective: Chief Complaint  Patient presents with   Left Knee - Pain    HPI patient is a pleasant 61 year old female who comes in today with recurrent left knee pain.  History of osteoarthritis.  She is undergone multiple cortisone injections which have provided about 2 months relief.  She has undergone gel injections in the past without relief.  Her last cortisone injection was in mid September of this past year which helped until recently.  Symptoms have returned.  Pain is throughout the entire knee worse after she has been walking for a while.  She does not take medicine for this.  Review of Systems as detailed in HPI.  All others reviewed and are  negative.   Objective: Vital Signs: LMP 02/18/2011   Physical Exam well-developed well-nourished female no acute distress.  Alert and oriented x 3.  Ortho Exam left knee exam shows small effusion.  Range of motion 0 to 120 degrees.  No joint line tenderness.  Moderate patellofemoral crepitus.  She is neurovascular tact distally.  Specialty Comments:  No specialty comments available.  Imaging: No new imaging   PMFS History: Patient Active Problem List   Diagnosis Date Noted   Prediabetes 02/25/2022   Acute medial meniscus tear of left knee 08/18/2021   Primary osteoarthritis of left knee 08/18/2021   Hyperlipidemia LDL goal <130 12/31/2019   Urinary incontinence 09/30/2017   Routine general medical examination at a health care facility 09/30/2017   Morbid obesity ([place]) 10/16/2009   Hypothyroidism 07/11/2009   Paranoid schizophrenia in remission ([place]) 06/24/2009   Essential hypertension 06/24/2009   HX, PERSONAL, MALIGNANCY, BREAST 12/07/2006   Past Medical History:  Diagnosis Date   Bipolar disorder ([place])    Breast cancer ([place])    with chemo - 1997   Hypertension    Personal history of chemotherapy    Syphilis    Thyroid disease    Thyroid disease     Family History  Problem Relation Age of Onset   Cancer Mother        ?  type   Stroke Father    Breast cancer Sister        77's   Diabetes Brother    Coronary artery disease Brother    Colon cancer Neg Hx    Esophageal cancer Neg Hx    Rectal cancer Neg Hx    Stomach cancer Neg Hx     Past Surgical History:  Procedure Laterality Date   BREAST BIOPSY  1997   MASTECTOMY     left breat mastectomy [place]   Social History   Occupational History   Not on file  Tobacco Use   Smoking status: Former    Packs/day: 0.50    Years: 37.00    Total pack years: 18.50    Types: Cigarettes   Smokeless tobacco: Never  Vaping Use   Vaping Use: Former  Substance and Sexual Activity    Alcohol use: No    Comment: Pt denies   Drug use: No    Comment: Pt denies    Sexual activity: Yes    Birth control/protection: Surgical    Comment: 1st intercourse 61 yo-More than 5 partners

## 2022-04-09 DIAGNOSIS — C50912 Malignant neoplasm of unspecified site of left female breast: Secondary | ICD-10-CM | POA: Diagnosis not present

## 2022-04-13 ENCOUNTER — Telehealth: Payer: Self-pay

## 2022-04-13 NOTE — Telephone Encounter (Signed)
[employer] Order has been placed inside Dr Nathanial Millman office box

## 2022-05-01 ENCOUNTER — Other Ambulatory Visit: Payer: Self-pay | Admitting: Internal Medicine

## 2022-05-04 ENCOUNTER — Other Ambulatory Visit: Payer: Self-pay | Admitting: Internal Medicine

## 2022-05-11 ENCOUNTER — Encounter: Payer: Medicare Other | Admitting: Dietician

## 2022-06-07 ENCOUNTER — Ambulatory Visit (INDEPENDENT_AMBULATORY_CARE_PROVIDER_SITE_OTHER): Payer: Medicare Other | Admitting: Internal Medicine

## 2022-06-07 ENCOUNTER — Encounter: Payer: Self-pay | Admitting: Internal Medicine

## 2022-06-07 VITALS — BP 120/80 | HR 91 | Temp 98.3°F | Ht 63.0 in | Wt 216.0 lb

## 2022-06-07 DIAGNOSIS — I10 Essential (primary) hypertension: Secondary | ICD-10-CM | POA: Diagnosis not present

## 2022-06-07 NOTE — Progress Notes (Unsigned)
   Subjective:   Patient ID: Jacqueline Berry, female    DOB: 05-31-1960, 62 y.o.   MRN: [id number]  HPI The patient is a 62 YO female coming in for BP check. Was high recently making her unsure if change was needed.  Review of Systems  Constitutional: Negative.   HENT: Negative.    Eyes: Negative.   Respiratory:  Negative for cough, chest tightness and shortness of breath.   Cardiovascular:  Negative for chest pain, palpitations and leg swelling.  Gastrointestinal:  Negative for abdominal distention, abdominal pain, constipation, diarrhea, nausea and vomiting.  Musculoskeletal: Negative.   Skin: Negative.   Neurological: Negative.   Psychiatric/Behavioral: Negative.      Objective:  Physical Exam Constitutional:      Appearance: She is well-developed.  HENT:     Head: Normocephalic and atraumatic.  Cardiovascular:     Rate and Rhythm: Normal rate and regular rhythm.  Pulmonary:     Effort: Pulmonary effort is normal. No respiratory distress.     Breath sounds: Normal breath sounds. No wheezing or rales.  Abdominal:     General: Bowel sounds are normal. There is no distension.     Palpations: Abdomen is soft.     Tenderness: There is no abdominal tenderness. There is no rebound.  Musculoskeletal:     Cervical back: Normal range of motion.  Skin:    General: Skin is warm and dry.  Neurological:     Mental Status: She is alert and oriented to person, place, and time.     Coordination: Coordination normal.     Vitals:   06/07/22 1538  BP: 120/80  Pulse: 91  Temp: 98.3 F (36.8 C)  TempSrc: Oral  SpO2: 98%  Weight: 216 lb (98 kg)  Height: 5' 3"$  (1.6 m)    Assessment & Plan:  Visit time 16 minutes in face to face communication with patient and coordination of care, additional 4 minutes spent in record review, coordination or care, ordering tests, communicating/referring to other healthcare professionals, documenting in medical records all on the same day of the visit  for total time 20 minutes spent on the visit.

## 2022-06-08 ENCOUNTER — Telehealth: Payer: Self-pay | Admitting: Internal Medicine

## 2022-06-08 ENCOUNTER — Encounter: Payer: Self-pay | Admitting: Internal Medicine

## 2022-06-08 DIAGNOSIS — R0683 Snoring: Secondary | ICD-10-CM

## 2022-06-08 NOTE — Assessment & Plan Note (Signed)
Reassurance that BP is normal and she does not need medication change. She will continue amlodipine 10 mg daily and lisinopril 20/12.5 mg daily.

## 2022-06-08 NOTE — Telephone Encounter (Signed)
Patient wants to know if she can get some blood work done - she thinks she is sleeping too much during the day.  Please call patient and let her know.  Patient's number:  [phone]

## 2022-06-10 ENCOUNTER — Other Ambulatory Visit: Payer: Self-pay | Admitting: Internal Medicine

## 2022-06-10 ENCOUNTER — Other Ambulatory Visit: Payer: Self-pay

## 2022-06-10 DIAGNOSIS — E039 Hypothyroidism, unspecified: Secondary | ICD-10-CM

## 2022-06-10 MED ORDER — LEVOTHYROXINE SODIUM 100 MCG PO TABS: 100.0000 ug | ORAL_TABLET | Freq: Every day | ORAL | 0 refills | Status: DC

## 2022-06-10 NOTE — Telephone Encounter (Signed)
Called patient and left a voicemail 

## 2022-06-10 NOTE — Telephone Encounter (Signed)
Does she know if she snores? She could need sleep study to check for sleep apnea which is a common cause of sleepiness during the day. Labs were normal in November.

## 2022-06-10 NOTE — Telephone Encounter (Signed)
I was able to speak with the pt and she has stated that she does snore at times in her sleep. Pt has agreed to sleep study test.

## 2022-06-11 NOTE — Telephone Encounter (Signed)
Ordered

## 2022-06-11 NOTE — Addendum Note (Signed)
Addended by: Hoyt Koch on: 06/11/2022 07:34 AM   Modules accepted: Orders

## 2022-06-29 ENCOUNTER — Encounter: Payer: Self-pay | Admitting: Orthopaedic Surgery

## 2022-06-29 ENCOUNTER — Ambulatory Visit: Payer: Medicare Other | Admitting: Orthopaedic Surgery

## 2022-06-29 DIAGNOSIS — M1712 Unilateral primary osteoarthritis, left knee: Secondary | ICD-10-CM

## 2022-06-29 MED ORDER — METHYLPREDNISOLONE ACETATE 40 MG/ML IJ SUSP
40.0000 mg | INTRAMUSCULAR | Status: AC | PRN
  Administered 2022-06-29: 40 mg via INTRA_ARTICULAR

## 2022-06-29 MED ORDER — BUPIVACAINE HCL 0.5 % IJ SOLN
2.0000 mL | INTRAMUSCULAR | Status: AC | PRN
  Administered 2022-06-29: 2 mL via INTRA_ARTICULAR

## 2022-06-29 MED ORDER — LIDOCAINE HCL 1 % IJ SOLN
2.0000 mL | INTRAMUSCULAR | Status: AC | PRN
  Administered 2022-06-29: 2 mL

## 2022-06-29 NOTE — Progress Notes (Signed)
Office Visit Note   Patient: Jacqueline Berry           Date of Birth: Apr 29, 1960           MRN: [id number] Visit Date: 06/29/2022              Requested by: Hoyt Koch, MD [address] [place],  [place] [address] PCP: Hoyt Koch, MD   Assessment & Plan: Visit Diagnoses:  1. Primary osteoarthritis of left knee     Plan: Impression is left knee osteoarthritis.  Steroid injection repeated today.  Follow-up as needed.  Follow-Up Instructions: No follow-ups on file.   Orders:  No orders of the defined types were placed in this encounter.  No orders of the defined types were placed in this encounter.     Procedures: Large Joint Inj: L knee on 06/29/2022 5:47 PM Details: 22 G needle Medications: 2 mL bupivacaine 0.5 %; 2 mL lidocaine 1 %; 40 mg methylPREDNISolone acetate 40 MG/ML Outcome: tolerated well, no immediate complications Patient was prepped and draped in the usual sterile fashion.       Clinical Data: No additional findings.   Subjective: Chief Complaint  Patient presents with   Left Knee - Follow-up, Pain    HPI  Jaiona follows up today for left knee DJD.  Requesting another steroid injection.  Her last 1 was a little over 3 months ago.  Review of Systems   Objective: Vital Signs: LMP 02/18/2011   Physical Exam  Ortho Exam  Left knee exam is unchanged.  Specialty Comments:  No specialty comments available.  Imaging: No results found.   PMFS History: Patient Active Problem List   Diagnosis Date Noted   Prediabetes 02/25/2022   Acute medial meniscus tear of left knee 08/18/2021   Primary osteoarthritis of left knee 08/18/2021   Hyperlipidemia LDL goal <130 12/31/2019   Urinary incontinence 09/30/2017   Routine general medical examination at a health care facility 09/30/2017   Morbid obesity ([place]) 10/16/2009   Hypothyroidism 07/11/2009   Paranoid schizophrenia in remission ([place]) 06/24/2009   Essential  hypertension 06/24/2009   HX, PERSONAL, MALIGNANCY, BREAST 12/07/2006   Past Medical History:  Diagnosis Date   Bipolar disorder ([place])    Breast cancer ([place])    with chemo - 1997   Hypertension    Personal history of chemotherapy    Syphilis    Thyroid disease    Thyroid disease     Family History  Problem Relation Age of Onset   Cancer Mother        ? type   Stroke Father    Breast cancer Sister        95's   Diabetes Brother    Coronary artery disease Brother    Colon cancer Neg Hx    Esophageal cancer Neg Hx    Rectal cancer Neg Hx    Stomach cancer Neg Hx     Past Surgical History:  Procedure Laterality Date   BREAST BIOPSY  1997   MASTECTOMY     left breat mastectomy [place]   Social History   Occupational History   Not on file  Tobacco Use   Smoking status: Former    Packs/day: 0.50    Years: 37.00    Total pack years: 18.50    Types: Cigarettes   Smokeless tobacco: Never  Vaping Use   Vaping Use: Former  Substance and  Sexual Activity   Alcohol use: No    Comment: Pt denies   Drug use: No    Comment: Pt denies    Sexual activity: Yes    Birth control/protection: Surgical    Comment: 1st intercourse 62 yo-More than 5 partners

## 2022-07-09 ENCOUNTER — Telehealth: Payer: Self-pay | Admitting: Orthopaedic Surgery

## 2022-07-09 NOTE — Telephone Encounter (Signed)
Patient asking to get something to get the swelling out of he legs. Please advise.Frontal scalp thinning with intact frontal hairline and miniaturization call patient

## 2022-07-09 NOTE — Telephone Encounter (Signed)
Nothing I can think of

## 2022-07-10 ENCOUNTER — Other Ambulatory Visit: Payer: Self-pay | Admitting: Orthopaedic Surgery

## 2022-08-04 ENCOUNTER — Encounter (HOSPITAL_BASED_OUTPATIENT_CLINIC_OR_DEPARTMENT_OTHER): Payer: Medicare Other | Admitting: Internal Medicine

## 2022-08-11 ENCOUNTER — Other Ambulatory Visit: Payer: Self-pay | Admitting: Internal Medicine

## 2022-08-11 DIAGNOSIS — Z1231 Encounter for screening mammogram for malignant neoplasm of breast: Secondary | ICD-10-CM

## 2022-08-19 ENCOUNTER — Encounter: Payer: Self-pay | Admitting: Internal Medicine

## 2022-08-19 ENCOUNTER — Ambulatory Visit (INDEPENDENT_AMBULATORY_CARE_PROVIDER_SITE_OTHER): Payer: Medicare Other | Admitting: Internal Medicine

## 2022-08-19 VITALS — BP 150/100 | HR 86 | Temp 97.9°F | Ht 63.0 in | Wt 214.0 lb

## 2022-08-19 DIAGNOSIS — N644 Mastodynia: Secondary | ICD-10-CM

## 2022-08-19 DIAGNOSIS — R0789 Other chest pain: Secondary | ICD-10-CM | POA: Diagnosis not present

## 2022-08-19 NOTE — Progress Notes (Signed)
   Subjective:   Patient ID: Jacqueline Berry, female    DOB: [birth year], 62 y.o.   MRN: [id number]  HPI The patient is a 62 YO female coming in for right breast pain intermittently. Prior workup 2022 normal.   Review of Systems  Constitutional: Negative.   HENT: Negative.    Eyes: Negative.   Respiratory:  Negative for cough, chest tightness and shortness of breath.   Cardiovascular:  Negative for chest pain, palpitations and leg swelling.       Breast pain  Gastrointestinal:  Negative for abdominal distention, abdominal pain, constipation, diarrhea, nausea and vomiting.  Musculoskeletal: Negative.   Skin: Negative.   Neurological: Negative.   Psychiatric/Behavioral: Negative.      Objective:  Physical Exam Constitutional:      Appearance: She is well-developed.  HENT:     Head: Normocephalic and atraumatic.  Cardiovascular:     Rate and Rhythm: Normal rate and regular rhythm.  Pulmonary:     Effort: Pulmonary effort is normal. No respiratory distress.     Breath sounds: Normal breath sounds. No wheezing or rales.  Abdominal:     General: Bowel sounds are normal. There is no distension.     Palpations: Abdomen is soft.     Tenderness: There is no abdominal tenderness. There is no rebound.  Musculoskeletal:     Cervical back: Normal range of motion.  Skin:    General: Skin is warm and dry.  Neurological:     Mental Status: She is alert and oriented to person, place, and time.     Coordination: Coordination normal.     Vitals:   08/19/22 0950 08/19/22 0952  BP: (!) 150/100 (!) 150/100  Pulse: 86   Temp: 97.9 F (36.6 C)   TempSrc: Oral   SpO2: 96%   Weight: 214 lb (97.1 kg)   Height: 5\' 3"  (1.6 m)    EKG: Rate 72, axis normal, interval incomplete RBBB, sinus, no st or t wave changes, prior LAFB resolved, new partial RBBB stable insignificant ST changes from 2019   Assessment & Plan:  Visit time 20 minutes in face to face communication with patient and  coordination of care, additional 10 minutes spent in record review, coordination or care, ordering tests, communicating/referring to other healthcare professionals, documenting in medical records all on the same day of the visit for total time 30 minutes spent on the visit.

## 2022-08-19 NOTE — Patient Instructions (Signed)
We have done the EKG which is normal of the heart.  We will check the breast out

## 2022-08-20 DIAGNOSIS — R0789 Other chest pain: Secondary | ICD-10-CM | POA: Insufficient documentation

## 2022-08-20 NOTE — Assessment & Plan Note (Signed)
Past left breast cancer s/p mastectomy. With intermittent right breast pain documented back at least to 2022. She states this happens seasonally 1-2 times per month. Ordered diagnostic mammogram and ultrasound to assess right breast.

## 2022-08-20 NOTE — Assessment & Plan Note (Signed)
Some pains in the chest which are not well described. BP elevated today so EKG done which is stable from prior 2019. She has not taken meds for BP today and prior readings all at goal on meds. Will leave regimen the same with lisinopril/hctz 20/12.5 mg daily and amlodipine 10 mg daily.

## 2022-08-24 ENCOUNTER — Encounter (HOSPITAL_BASED_OUTPATIENT_CLINIC_OR_DEPARTMENT_OTHER): Payer: Medicare Other | Admitting: Internal Medicine

## 2022-09-06 ENCOUNTER — Other Ambulatory Visit: Payer: Self-pay | Admitting: Internal Medicine

## 2022-09-06 DIAGNOSIS — E039 Hypothyroidism, unspecified: Secondary | ICD-10-CM

## 2022-09-09 DIAGNOSIS — C50912 Malignant neoplasm of unspecified site of left female breast: Secondary | ICD-10-CM | POA: Diagnosis not present

## 2022-09-28 ENCOUNTER — Ambulatory Visit: Payer: Medicare Other | Admitting: Orthopaedic Surgery

## 2022-09-28 DIAGNOSIS — M25562 Pain in left knee: Secondary | ICD-10-CM

## 2022-09-28 DIAGNOSIS — M1712 Unilateral primary osteoarthritis, left knee: Secondary | ICD-10-CM

## 2022-09-28 MED ORDER — BUPIVACAINE HCL 0.5 % IJ SOLN
2.0000 mL | INTRAMUSCULAR | Status: AC | PRN
Start: 2022-09-28 — End: 2022-09-28
  Administered 2022-09-28: 2 mL via INTRA_ARTICULAR

## 2022-09-28 MED ORDER — LIDOCAINE HCL 1 % IJ SOLN
2.0000 mL | INTRAMUSCULAR | Status: AC | PRN
Start: 2022-09-28 — End: 2022-09-28
  Administered 2022-09-28: 2 mL

## 2022-09-28 MED ORDER — METHYLPREDNISOLONE ACETATE 40 MG/ML IJ SUSP
40.0000 mg | INTRAMUSCULAR | Status: AC | PRN
Start: 2022-09-28 — End: 2022-09-28
  Administered 2022-09-28: 40 mg via INTRA_ARTICULAR

## 2022-09-28 NOTE — Progress Notes (Signed)
Office Visit Note   Patient: Jacqueline Berry           Date of Birth: 1960-08-25           MRN: [id number] Visit Date: 09/28/2022              Requested by: Myrlene Broker, MD [address] [place],  [place] [address] PCP: Myrlene Broker, MD   Assessment & Plan: Visit Diagnoses:  1. Primary osteoarthritis of left knee     Plan: Impression is left knee OA.  Cortisone injection performed today.  She tolerated this well.  Follow-up as needed.  Follow-Up Instructions: No follow-ups on file.   Orders:  No orders of the defined types were placed in this encounter.  No orders of the defined types were placed in this encounter.     Procedures: Large Joint Inj: L knee on 09/28/2022 3:07 PM Details: 22 G needle Medications: 2 mL bupivacaine 0.5 %; 2 mL lidocaine 1 %; 40 mg methylPREDNISolone acetate 40 MG/ML Outcome: tolerated well, no immediate complications Patient was prepped and draped in the usual sterile fashion.       Clinical Data: No additional findings.   Subjective: No chief complaint on file.   HPI Jacqueline Berry comes in for follow-up on left knee osteoarthritis.  Requesting cortisone injection today.  She has had good relief from previous ones. Review of Systems  Constitutional: Negative.   HENT: Negative.    Eyes: Negative.   Respiratory: Negative.    Cardiovascular: Negative.   Endocrine: Negative.   Musculoskeletal: Negative.   Neurological: Negative.   Hematological: Negative.   Psychiatric/Behavioral: Negative.    All other systems reviewed and are negative.   Objective: Vital Signs: LMP 02/18/2011   Physical Exam Vitals and nursing note reviewed.  Constitutional:      Appearance: She is well-developed.  HENT:     Head: Atraumatic.     Nose: Nose normal.  Eyes:     Extraocular Movements: Extraocular movements intact.  Cardiovascular:     Pulses: Normal pulses.  Pulmonary:     Effort: Pulmonary effort is normal.  Abdominal:      Palpations: Abdomen is soft.  Musculoskeletal:     Cervical back: Neck supple.  Skin:    General: Skin is warm.     Capillary Refill: Capillary refill takes less than 2 seconds.  Neurological:     Mental Status: She is alert. Mental status is at baseline.  Psychiatric:        Behavior: Behavior normal.        Thought Content: Thought content normal.        Judgment: Judgment normal.   Ortho Exam Examination left knee is unchanged. Specialty Comments:  No specialty comments available.  Imaging: No results found.   PMFS History: Patient Active Problem List   Diagnosis Date Noted   Atypical chest pain 08/20/2022   Prediabetes 02/25/2022   Acute medial meniscus tear of left knee 08/18/2021   Primary osteoarthritis of left knee 08/18/2021   Breast pain, right 05/16/2020   Hyperlipidemia LDL goal <130 12/31/2019   Urinary incontinence 09/30/2017   Routine general medical examination at a health care facility 09/30/2017   Morbid obesity ([facility]) 10/16/2009   Hypothyroidism 07/11/2009   Paranoid schizophrenia in remission ([facility]) 06/24/2009   Essential hypertension 06/24/2009   HX, PERSONAL, MALIGNANCY, BREAST 12/07/2006   Past Medical History:  Diagnosis Date   Bipolar disorder ([facility])    Breast cancer ([facility])  with chemo - 1997   Hypertension    Personal history of chemotherapy    Syphilis    Thyroid disease    Thyroid disease     Family History  Problem Relation Age of Onset   Cancer Mother        ? type   Stroke Father    Breast cancer Sister        28's   Diabetes Brother    Coronary artery disease Brother    Colon cancer Neg Hx    Esophageal cancer Neg Hx    Rectal cancer Neg Hx    Stomach cancer Neg Hx     Past Surgical History:  Procedure Laterality Date   BREAST BIOPSY  1997   MASTECTOMY     left breat mastectomy 1997   TUBAL LIGATION     1992   Social History   Occupational History   Not on file  Tobacco Use   Smoking status: Former     Packs/day: 0.50    Years: 37.00    Additional pack years: 0.00    Total pack years: 18.50    Types: Cigarettes   Smokeless tobacco: Never  Vaping Use   Vaping Use: Former  Substance and Sexual Activity   Alcohol use: No    Comment: Pt denies   Drug use: No    Comment: Pt denies    Sexual activity: Yes    Birth control/protection: Surgical    Comment: 1st intercourse 62 yo-More than 5 partners

## 2022-09-29 ENCOUNTER — Ambulatory Visit
Admission: RE | Admit: 2022-09-29 | Discharge: 2022-09-29 | Disposition: A | Discharge status: Home / Self Care | Payer: Medicare Other | Source: Ambulatory Visit | Attending: Internal Medicine | Admitting: Internal Medicine

## 2022-09-29 ENCOUNTER — Other Ambulatory Visit: Payer: Self-pay | Admitting: Internal Medicine

## 2022-09-29 ENCOUNTER — Ambulatory Visit: Payer: Medicare Other

## 2022-09-29 DIAGNOSIS — N644 Mastodynia: Secondary | ICD-10-CM | POA: Diagnosis not present

## 2022-09-29 DIAGNOSIS — R0789 Other chest pain: Secondary | ICD-10-CM

## 2022-09-29 LAB — HM MAMMOGRAPHY

## 2022-09-30 ENCOUNTER — Encounter: Payer: Self-pay | Admitting: Internal Medicine

## 2022-11-04 DIAGNOSIS — C50919 Malignant neoplasm of unspecified site of unspecified female breast: Secondary | ICD-10-CM | POA: Diagnosis not present

## 2022-11-04 DIAGNOSIS — Z131 Encounter for screening for diabetes mellitus: Secondary | ICD-10-CM | POA: Diagnosis not present

## 2022-11-04 DIAGNOSIS — E559 Vitamin D deficiency, unspecified: Secondary | ICD-10-CM | POA: Diagnosis not present

## 2022-11-04 DIAGNOSIS — R7989 Other specified abnormal findings of blood chemistry: Secondary | ICD-10-CM | POA: Diagnosis not present

## 2022-11-04 DIAGNOSIS — E063 Autoimmune thyroiditis: Secondary | ICD-10-CM | POA: Diagnosis not present

## 2022-11-04 DIAGNOSIS — I10 Essential (primary) hypertension: Secondary | ICD-10-CM | POA: Diagnosis not present

## 2022-11-04 DIAGNOSIS — E039 Hypothyroidism, unspecified: Secondary | ICD-10-CM | POA: Diagnosis not present

## 2022-11-08 DIAGNOSIS — E039 Hypothyroidism, unspecified: Secondary | ICD-10-CM | POA: Diagnosis not present

## 2022-11-08 DIAGNOSIS — I10 Essential (primary) hypertension: Secondary | ICD-10-CM | POA: Diagnosis not present

## 2022-11-08 DIAGNOSIS — C50919 Malignant neoplasm of unspecified site of unspecified female breast: Secondary | ICD-10-CM | POA: Diagnosis not present

## 2022-11-08 DIAGNOSIS — E063 Autoimmune thyroiditis: Secondary | ICD-10-CM | POA: Diagnosis not present

## 2022-11-08 DIAGNOSIS — E559 Vitamin D deficiency, unspecified: Secondary | ICD-10-CM | POA: Diagnosis not present

## 2022-11-08 DIAGNOSIS — R7989 Other specified abnormal findings of blood chemistry: Secondary | ICD-10-CM | POA: Diagnosis not present

## 2022-11-17 ENCOUNTER — Encounter (INDEPENDENT_AMBULATORY_CARE_PROVIDER_SITE_OTHER): Payer: Self-pay

## 2022-11-30 ENCOUNTER — Encounter: Payer: Self-pay | Admitting: Internal Medicine

## 2022-11-30 ENCOUNTER — Ambulatory Visit: Payer: Medicare Other | Admitting: Internal Medicine

## 2022-11-30 VITALS — BP 124/98 | HR 78 | Temp 98.4°F | Ht 63.0 in | Wt 219.0 lb

## 2022-11-30 DIAGNOSIS — H538 Other visual disturbances: Secondary | ICD-10-CM

## 2022-11-30 DIAGNOSIS — E039 Hypothyroidism, unspecified: Secondary | ICD-10-CM

## 2022-11-30 DIAGNOSIS — R7303 Prediabetes: Secondary | ICD-10-CM | POA: Diagnosis not present

## 2022-11-30 DIAGNOSIS — I10 Essential (primary) hypertension: Secondary | ICD-10-CM | POA: Diagnosis not present

## 2022-11-30 LAB — CBC
HCT: 39.8 % (ref 36.0–46.0)
Hemoglobin: 12.8 g/dL (ref 12.0–15.0)
MCHC: 32.1 g/dL (ref 30.0–36.0)
MCV: 85.8 fl (ref 78.0–100.0)
Platelets: 323 10*3/uL (ref 150.0–400.0)
RBC: 4.64 Mil/uL (ref 3.87–5.11)
RDW: 14.8 % (ref 11.5–15.5)
WBC: 6.9 10*3/uL (ref 4.0–10.5)

## 2022-11-30 LAB — COMPREHENSIVE METABOLIC PANEL
ALT: 28 U/L (ref 0–35)
AST: 20 U/L (ref 0–37)
Albumin: 4.3 g/dL (ref 3.5–5.2)
Alkaline Phosphatase: 90 U/L (ref 39–117)
BUN: 8 mg/dL (ref 6–23)
CO2: 32 mEq/L (ref 19–32)
Calcium: 10 mg/dL (ref 8.4–10.5)
Chloride: 100 mEq/L (ref 96–112)
Creatinine, Ser: 0.81 mg/dL (ref 0.40–1.20)
GFR: 77.5 mL/min (ref >60.00)
Glucose, Bld: 87 mg/dL (ref 70–99)
Potassium: 3.5 mEq/L (ref 3.5–5.1)
Sodium: 141 mEq/L (ref 135–145)
Total Bilirubin: 0.5 mg/dL (ref 0.2–1.2)
Total Protein: 7.5 g/dL (ref 6.0–8.3)

## 2022-11-30 LAB — LIPID PANEL
Cholesterol: 140 mg/dL (ref 0–200)
HDL: 35.9 mg/dL — ABNORMAL LOW (ref >39.00)
LDL Cholesterol: 82 mg/dL (ref 0–99)
NonHDL: 104.36
Total CHOL/HDL Ratio: 4
Triglycerides: 114 mg/dL (ref 0.0–149.0)
VLDL: 22.8 mg/dL (ref 0.0–40.0)

## 2022-11-30 LAB — T4, FREE: Free T4: 1.09 ng/dL (ref 0.60–1.60)

## 2022-11-30 LAB — TSH: TSH: 2.82 u[IU]/mL (ref 0.35–5.50)

## 2022-11-30 LAB — HEMOGLOBIN A1C: Hgb A1c MFr Bld: 6 % (ref 4.6–6.5)

## 2022-11-30 NOTE — Assessment & Plan Note (Signed)
Due for Hga1c and needs follow up ordered today.

## 2022-11-30 NOTE — Assessment & Plan Note (Signed)
She has not had eye exam with optho in some time. Referral done and needs full dilated exam. Given concurrent HTN could have HTN retinopathy.

## 2022-11-30 NOTE — Assessment & Plan Note (Signed)
Checking TSH and free T4 and adjust synthroid 100 mcg daily. Eyes with minimal protrudance on exam. Referral to optho for exam.

## 2022-11-30 NOTE — Progress Notes (Signed)
   Subjective:   Patient ID: Jacqueline Berry, female    DOB: Sep 05, 1960, 62 y.o.   MRN: [id number]  HPI The patient is a 62 YO female coming in for concerns about thyroid eye disease and blurry vision. Seen optometrist and several glasses rx without relief.   Review of Systems  Constitutional: Negative.   HENT: Negative.    Eyes:  Positive for visual disturbance.  Respiratory:  Negative for cough, chest tightness and shortness of breath.   Cardiovascular:  Negative for chest pain, palpitations and leg swelling.  Gastrointestinal:  Negative for abdominal distention, abdominal pain, constipation, diarrhea, nausea and vomiting.  Musculoskeletal: Negative.   Skin: Negative.   Neurological: Negative.   Psychiatric/Behavioral: Negative.      Objective:  Physical Exam Constitutional:      Appearance: She is well-developed.  HENT:     Head: Normocephalic and atraumatic.  Cardiovascular:     Rate and Rhythm: Normal rate and regular rhythm.  Pulmonary:     Effort: Pulmonary effort is normal. No respiratory distress.     Breath sounds: Normal breath sounds. No wheezing or rales.  Abdominal:     General: Bowel sounds are normal. There is no distension.     Palpations: Abdomen is soft.     Tenderness: There is no abdominal tenderness. There is no rebound.  Musculoskeletal:     Cervical back: Normal range of motion.  Skin:    General: Skin is warm and dry.  Neurological:     Mental Status: She is alert and oriented to person, place, and time.     Coordination: Coordination normal.     Vitals:   11/30/22 1345 11/30/22 1347  BP: (!) 124/98 (!) 124/98  Pulse: 78   Temp: 98.4 F (36.9 C)   TempSrc: Oral   SpO2: 98%   Weight: 219 lb (99.3 kg)   Height: 5\' 3"  (1.6 m)     Assessment & Plan:

## 2022-11-30 NOTE — Patient Instructions (Signed)
We will get you in with the eye doctor.

## 2022-11-30 NOTE — Assessment & Plan Note (Signed)
BP is high today which is unusual for her. She will continue lisinopril/hydrochlorothiazide 20/12.5 mg daily and amlodipine 10 mg daily. If high next visit will increase dosing. Checking CMP and CBC and lipid panel and adjust as needed.

## 2022-12-02 ENCOUNTER — Telehealth: Payer: Self-pay | Admitting: Internal Medicine

## 2022-12-02 NOTE — Telephone Encounter (Signed)
Not yet resulted

## 2022-12-02 NOTE — Telephone Encounter (Signed)
Pt called wanting to go over her lab results. Please advise.

## 2022-12-08 ENCOUNTER — Other Ambulatory Visit: Payer: Self-pay | Admitting: Internal Medicine

## 2022-12-08 DIAGNOSIS — E039 Hypothyroidism, unspecified: Secondary | ICD-10-CM

## 2022-12-09 ENCOUNTER — Other Ambulatory Visit: Payer: Self-pay

## 2022-12-09 ENCOUNTER — Telehealth: Payer: Self-pay | Admitting: Internal Medicine

## 2022-12-09 DIAGNOSIS — E039 Hypothyroidism, unspecified: Secondary | ICD-10-CM

## 2022-12-09 MED ORDER — LEVOTHYROXINE SODIUM 100 MCG PO TABS
100.0000 ug | ORAL_TABLET | Freq: Every day | ORAL | 0 refills | Status: DC
Start: 2022-12-09 — End: 2023-03-01

## 2022-12-09 NOTE — Telephone Encounter (Signed)
Prescription Request  12/09/2022  LOV: 11/30/2022  What is the name of the medication or equipment?  levothyroxine (SYNTHROID) 100 MCG tablet  Have you contacted your pharmacy to request a refill? Yes   Which pharmacy would you like this sent to?  [id number] - [place], [place] - [address] AT [facility] [address] [place] [address] Phone: [phone] Fax: [phone]    Patient notified that their request is being sent to the clinical staff for review and that they should receive a response within 2 business days.   Please advise at Mobile [phone] (mobile)

## 2022-12-15 DIAGNOSIS — E063 Autoimmune thyroiditis: Secondary | ICD-10-CM | POA: Diagnosis not present

## 2022-12-30 ENCOUNTER — Telehealth: Payer: Self-pay | Admitting: Pharmacist

## 2022-12-31 NOTE — Telephone Encounter (Signed)
Called patient to follow up on elevated BP at last visit for the [employer]. Unable to reach x3, left voicemails with number to return call.

## 2023-01-04 ENCOUNTER — Ambulatory Visit: Payer: Medicare Other | Admitting: Orthopaedic Surgery

## 2023-01-04 DIAGNOSIS — M1712 Unilateral primary osteoarthritis, left knee: Secondary | ICD-10-CM | POA: Diagnosis not present

## 2023-01-04 MED ORDER — LIDOCAINE HCL 1 % IJ SOLN
2.0000 mL | INTRAMUSCULAR | Status: AC | PRN
Start: 2023-01-04 — End: 2023-01-04
  Administered 2023-01-04: 2 mL

## 2023-01-04 MED ORDER — METHYLPREDNISOLONE ACETATE 40 MG/ML IJ SUSP
40.0000 mg | INTRAMUSCULAR | Status: AC | PRN
Start: 2023-01-04 — End: 2023-01-04
  Administered 2023-01-04: 40 mg via INTRA_ARTICULAR

## 2023-01-04 MED ORDER — BUPIVACAINE HCL 0.5 % IJ SOLN
2.0000 mL | INTRAMUSCULAR | Status: AC | PRN
Start: 2023-01-04 — End: 2023-01-04
  Administered 2023-01-04: 2 mL via INTRA_ARTICULAR

## 2023-01-04 NOTE — Progress Notes (Signed)
Office Visit Note   Patient: Jacqueline Berry           Date of Birth: Dec 28, 1960           MRN: [id number] Visit Date: 01/04/2023              Requested by: Myrlene Broker, MD [address] [place],  [place] [address] PCP: Myrlene Broker, MD   Assessment & Plan: Visit Diagnoses:  1. Primary osteoarthritis of left knee     Plan: Impression is left knee osteoarthritis.  Cortisone injection repeated today.  Follow-up as needed.  Follow-Up Instructions: No follow-ups on file.   Orders:  Orders Placed This Encounter  Procedures   Large Joint Inj   No orders of the defined types were placed in this encounter.     Procedures: Large Joint Inj: L knee on 01/04/2023 1:08 PM Details: 22 G needle Medications: 2 mL bupivacaine 0.5 %; 2 mL lidocaine 1 %; 40 mg methylPREDNISolone acetate 40 MG/ML Outcome: tolerated well, no immediate complications Patient was prepped and draped in the usual sterile fashion.       Clinical Data: No additional findings.   Subjective: Chief Complaint  Patient presents with   Left Knee - Pain    HPI Jacqueline Berry is a 62 year old female who returns today for left knee pain and osteoarthritis.  She is requesting another cortisone injection.   Review of Systems   Objective: Vital Signs: LMP 02/18/2011   Physical Exam  Ortho Exam Exam of the left knee is unchanged. Specialty Comments:  No specialty comments available.  Imaging: No results found.   PMFS History: Patient Active Problem List   Diagnosis Date Noted   Blurry vision 11/30/2022   Atypical chest pain 08/20/2022   Prediabetes 02/25/2022   Acute medial meniscus tear of left knee 08/18/2021   Primary osteoarthritis of left knee 08/18/2021   Breast pain, right 05/16/2020   Hyperlipidemia LDL goal <130 12/31/2019   Urinary incontinence 09/30/2017   Routine general medical examination at a health care facility 09/30/2017   Morbid obesity (HCC) 10/16/2009    Hypothyroidism 07/11/2009   Paranoid schizophrenia in remission (HCC) 06/24/2009   Essential hypertension 06/24/2009   HX, PERSONAL, MALIGNANCY, BREAST 12/07/2006   Past Medical History:  Diagnosis Date   Bipolar disorder (HCC)    Breast cancer (HCC)    with chemo - 1997   Hypertension    Personal history of chemotherapy    Syphilis    Thyroid disease    Thyroid disease     Family History  Problem Relation Age of Onset   Cancer Mother        ? type   Stroke Father    Breast cancer Sister        38's   Diabetes Brother    Coronary artery disease Brother    Colon cancer Neg Hx    Esophageal cancer Neg Hx    Rectal cancer Neg Hx    Stomach cancer Neg Hx     Past Surgical History:  Procedure Laterality Date   BREAST BIOPSY  1997   MASTECTOMY     left breat mastectomy 1997   TUBAL LIGATION     1992   Social History   Occupational History   Not on file  Tobacco Use   Smoking status: Former    Current packs/day: 0.50    Average packs/day: 0.5 packs/day for 37.0 years (18.5 ttl pk-yrs)    Types: Cigarettes  Smokeless tobacco: Never  Vaping Use   Vaping status: Former  Substance and Sexual Activity   Alcohol use: No    Comment: Pt denies   Drug use: No    Comment: Pt denies    Sexual activity: Yes    Birth control/protection: Surgical    Comment: 1st intercourse 62 yo-More than 5 partners

## 2023-01-09 IMAGING — CR DG KNEE COMPLETE 4+V*L*
4 series · 4 of 4 positions shown · non-contrast
Comparison: None

CLINICAL DATA: LEFT knee pain for awhile, fell, cannot remember how
long ago

EXAM:
LEFT KNEE - COMPLETE 4+ VIEW

[t knee ap left]
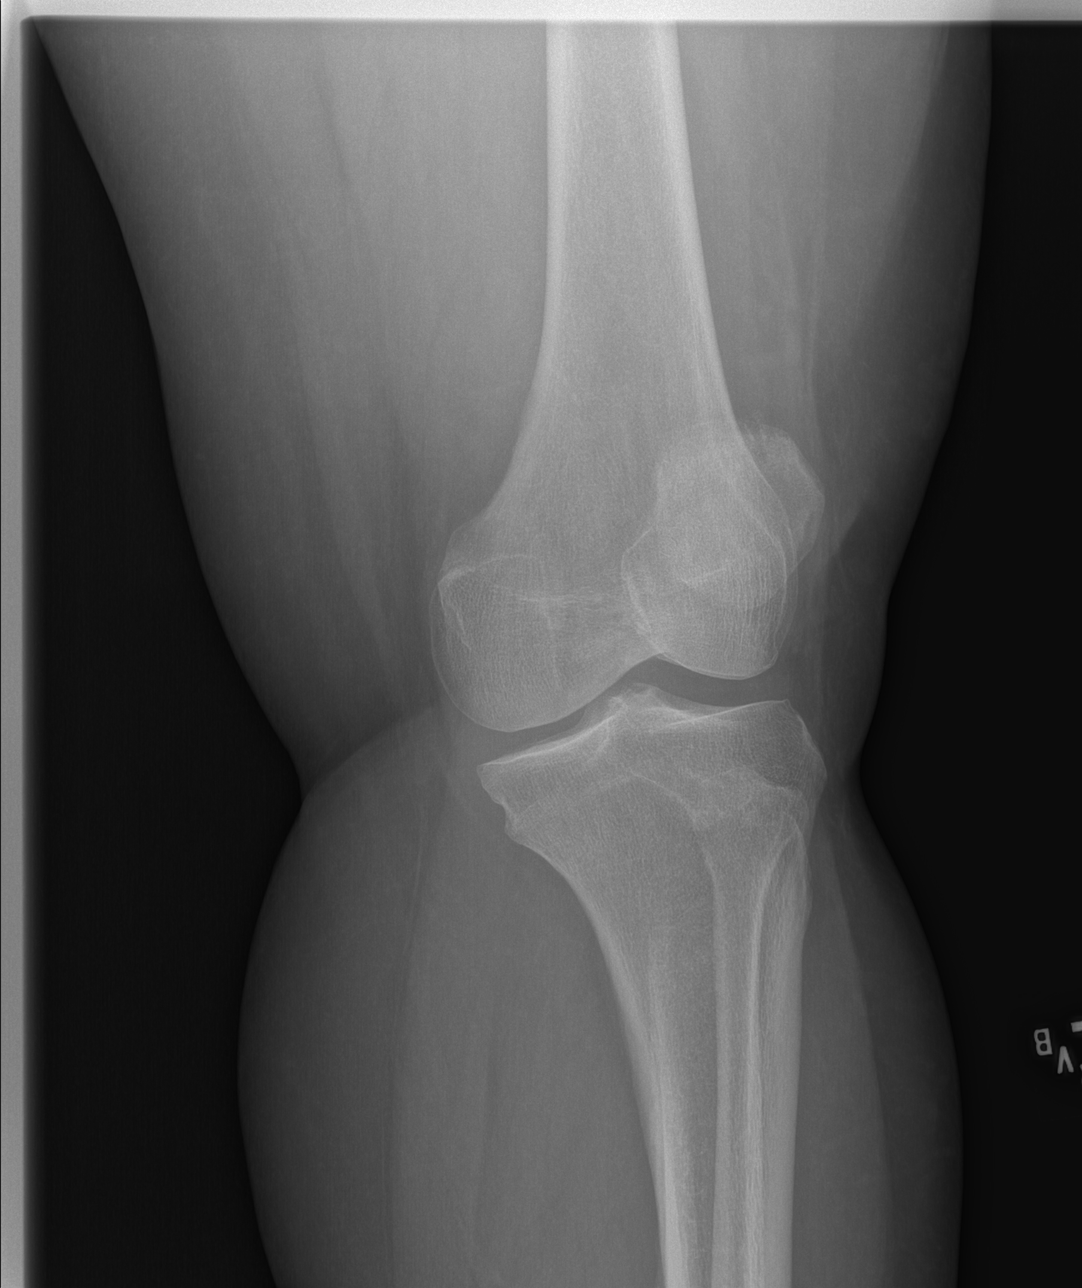

[t knee obl left (1 of 2)]
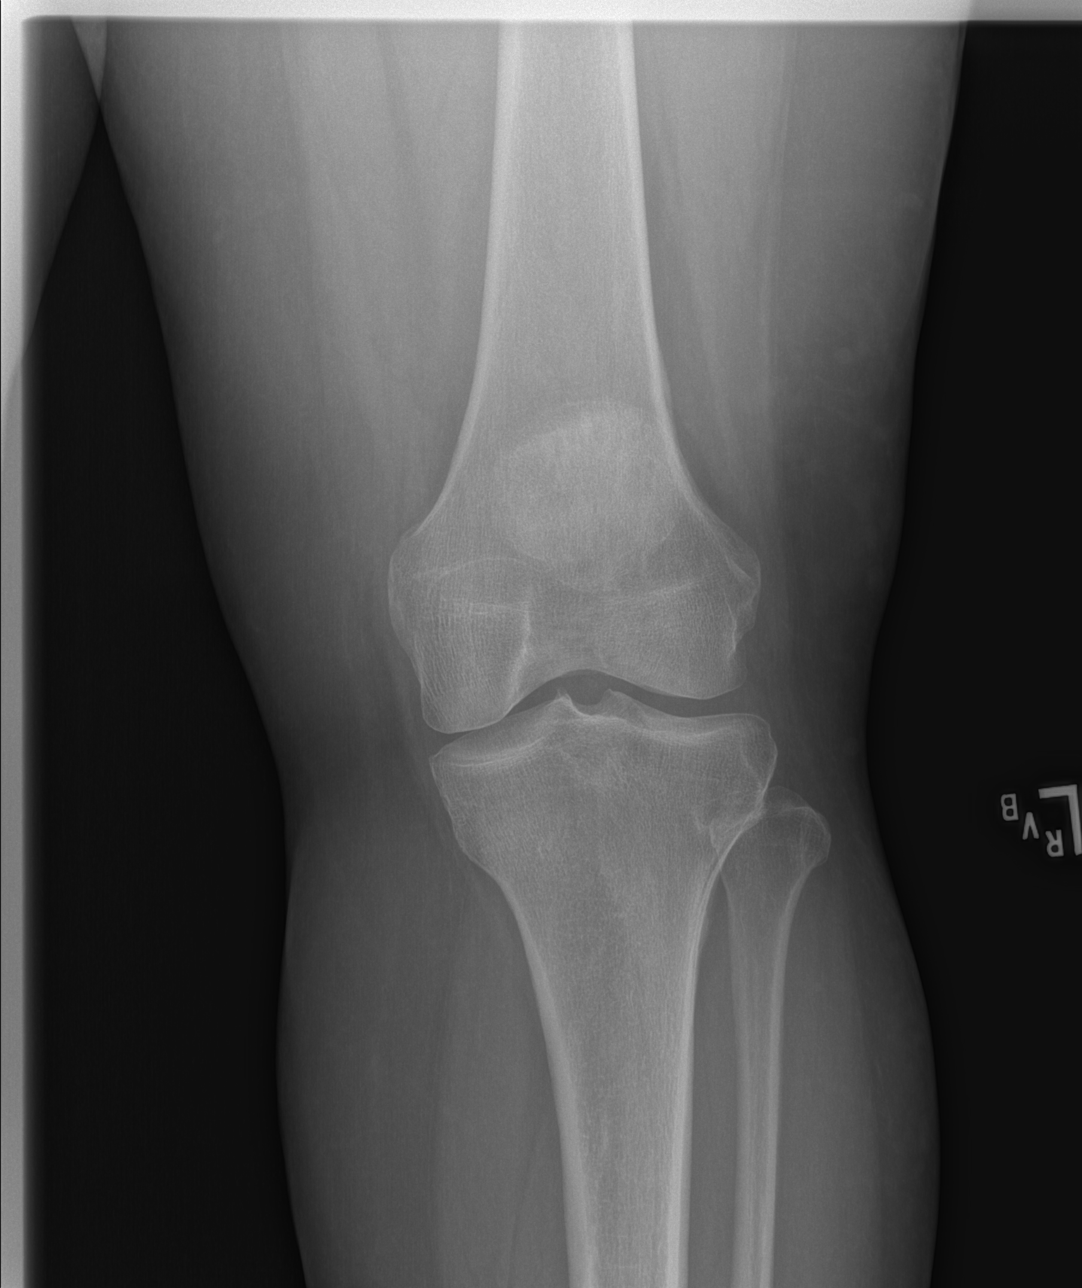

[t knee obl left (2 of 2)]
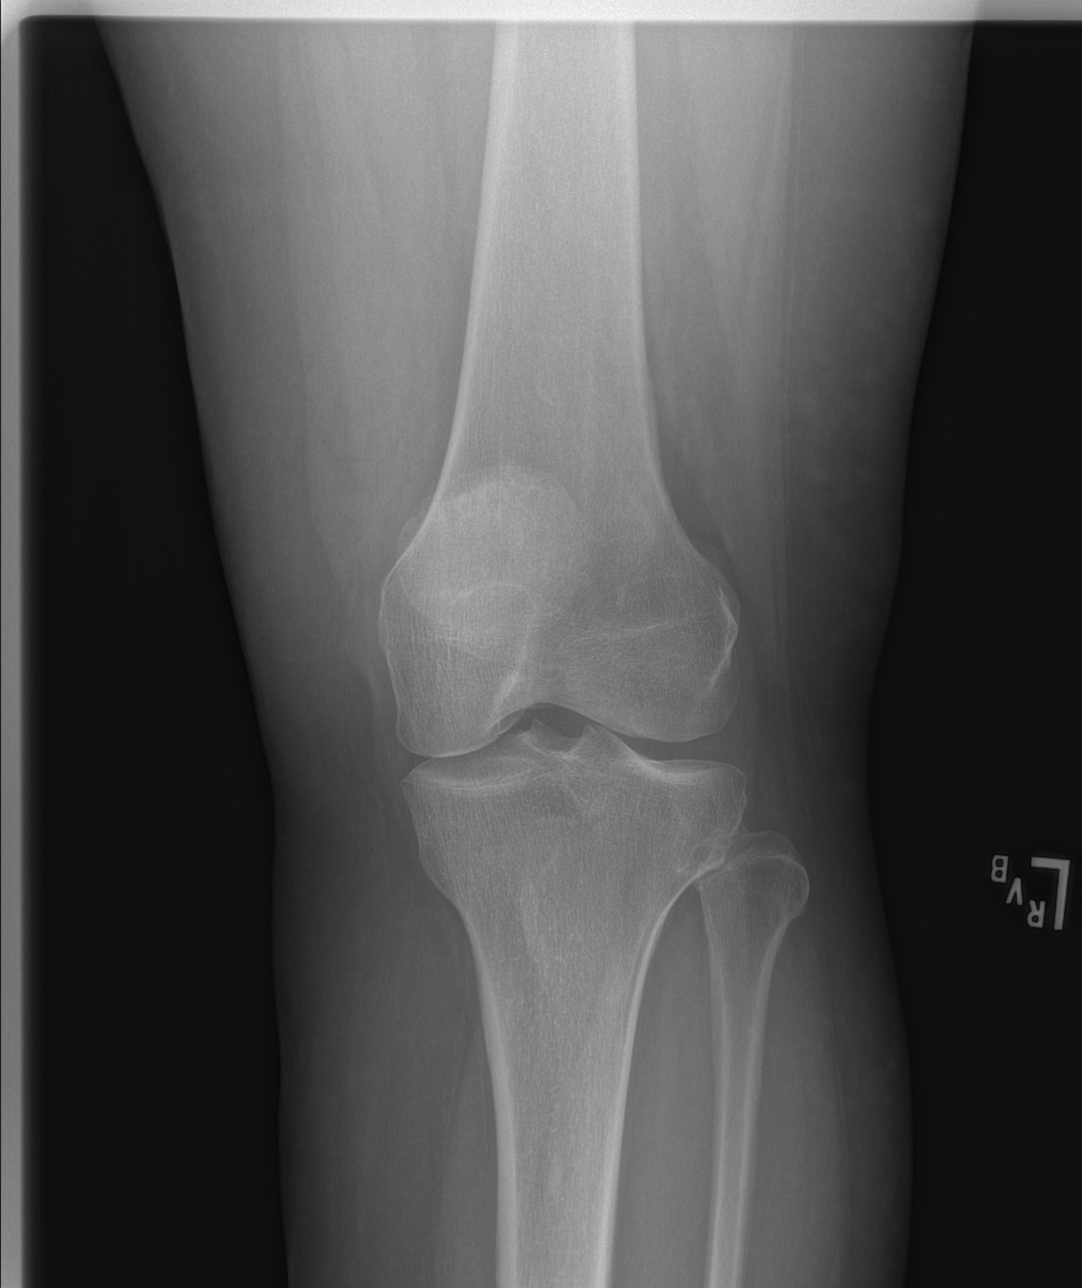

[t knee lat left]
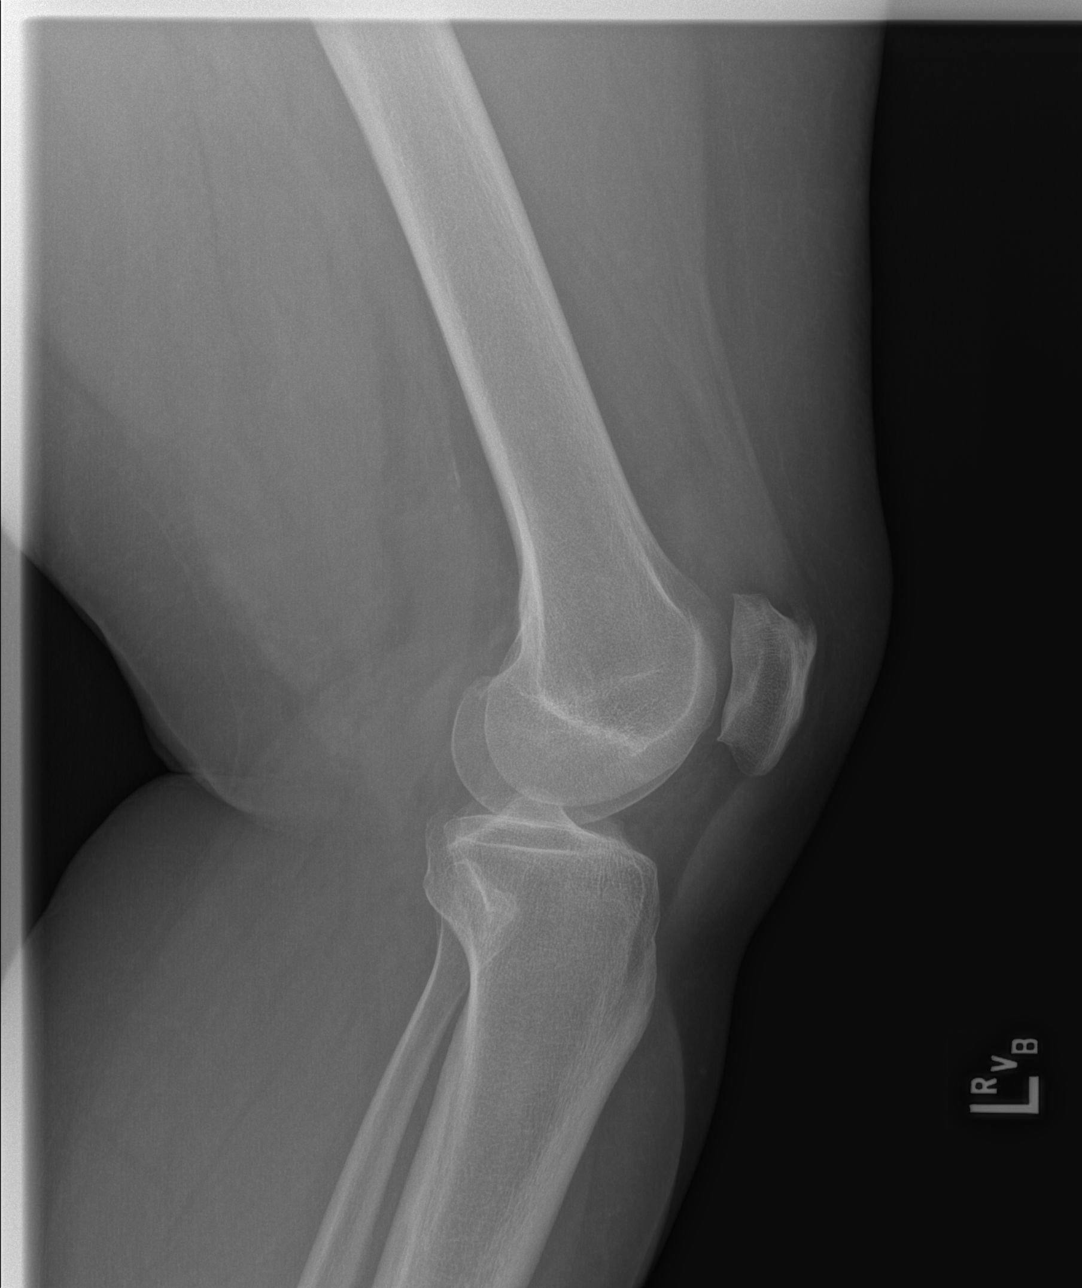

[4 of 4 positions shown; findings below may reference images not displayed]

FINDINGS: Osseous mineralization normal.

Joint spaces preserved.

Tiny patellar spurs.

No acute fracture, dislocation, or bone destruction.

No joint effusion.
IMPRESSION: Mild patellofemoral degenerative changes.

No acute abnormalities.

## 2023-01-25 DIAGNOSIS — E0500 Thyrotoxicosis with diffuse goiter without thyrotoxic crisis or storm: Secondary | ICD-10-CM | POA: Diagnosis not present

## 2023-01-25 DIAGNOSIS — H2513 Age-related nuclear cataract, bilateral: Secondary | ICD-10-CM | POA: Diagnosis not present

## 2023-01-25 DIAGNOSIS — H35033 Hypertensive retinopathy, bilateral: Secondary | ICD-10-CM | POA: Diagnosis not present

## 2023-01-27 ENCOUNTER — Other Ambulatory Visit: Payer: Self-pay | Admitting: Internal Medicine

## 2023-02-21 ENCOUNTER — Ambulatory Visit: Payer: Medicare Other | Admitting: Internal Medicine

## 2023-02-22 ENCOUNTER — Ambulatory Visit (INDEPENDENT_AMBULATORY_CARE_PROVIDER_SITE_OTHER): Payer: Medicare Other | Admitting: Internal Medicine

## 2023-02-22 ENCOUNTER — Encounter: Payer: Self-pay | Admitting: Internal Medicine

## 2023-02-22 VITALS — BP 106/72 | HR 82 | Temp 98.0°F | Ht 63.0 in | Wt 216.0 lb

## 2023-02-22 DIAGNOSIS — R1084 Generalized abdominal pain: Secondary | ICD-10-CM

## 2023-02-22 NOTE — Patient Instructions (Signed)
Try taking the docusate daily for 4-5 days and then just use as needed. Let us know if the stomach pain does not improve.

## 2023-02-22 NOTE — Progress Notes (Unsigned)
   Subjective:   Patient ID: Jacqueline Berry, female    DOB: 09-14-1960, 62 y.o.   MRN: [id number]  HPI The patient is a 62 YO female coming in for pain after eating. Had some constipation during this time. No nausea or vomiting. Denies diarrhea. Is still eating okay. Has some docusate at home did not take. Used pepto bismol and this helped and she had BM recently.   Review of Systems  Constitutional: Negative.   HENT: Negative.    Eyes: Negative.   Respiratory:  Negative for cough, chest tightness and shortness of breath.   Cardiovascular:  Negative for chest pain, palpitations and leg swelling.  Gastrointestinal:  Positive for abdominal pain and constipation. Negative for abdominal distention, diarrhea, nausea and vomiting.  Musculoskeletal: Negative.   Skin: Negative.   Neurological: Negative.   Psychiatric/Behavioral: Negative.      Objective:  Physical Exam Constitutional:      Appearance: She is well-developed.  HENT:     Head: Normocephalic and atraumatic.  Cardiovascular:     Rate and Rhythm: Normal rate and regular rhythm.  Pulmonary:     Effort: Pulmonary effort is normal. No respiratory distress.     Breath sounds: Normal breath sounds. No wheezing or rales.  Abdominal:     General: Bowel sounds are normal. There is no distension.     Palpations: Abdomen is soft.     Tenderness: There is no abdominal tenderness. There is no rebound.  Musculoskeletal:     Cervical back: Normal range of motion.  Skin:    General: Skin is warm and dry.  Neurological:     Mental Status: She is alert and oriented to person, place, and time.     Coordination: Coordination normal.     Vitals:   02/22/23 1420  BP: 106/72  Pulse: 82  Temp: 98 F (36.7 C)  TempSrc: Oral  SpO2: 99%  Weight: 216 lb (98 kg)  Height: 5\' 3"  (1.6 m)   Visit time 15 minutes in face to face communication with patient and coordination of care, additional 5 minutes spent in record review, coordination or  care, ordering tests, communicating/referring to other healthcare professionals, documenting in medical records all on the same day of the visit for total time 20 minutes spent on the visit.   Assessment & Plan:

## 2023-02-24 DIAGNOSIS — R109 Unspecified abdominal pain: Secondary | ICD-10-CM | POA: Insufficient documentation

## 2023-02-24 NOTE — Assessment & Plan Note (Signed)
Suspect was related to constipation pain. She used pepto bismol and this has resolved. She will need to start regimen to avoid constipation. She has docusate at home advised to take daily for 1-2 weeks then as needed to maintain regular BM. No imaging or labs indicated today.

## 2023-02-28 ENCOUNTER — Other Ambulatory Visit: Payer: Self-pay | Admitting: Internal Medicine

## 2023-02-28 DIAGNOSIS — E039 Hypothyroidism, unspecified: Secondary | ICD-10-CM

## 2023-03-28 NOTE — Progress Notes (Unsigned)
Office Visit Note   Patient: Jacqueline Berry           Date of Birth: 01/25/1961           MRN: [id number] Visit Date: 03/29/2023              Requested by: Myrlene Broker, MD [address] [place],  [place] [address] PCP: Myrlene Broker, MD   Assessment & Plan: Visit Diagnoses: No diagnosis found.  Plan: ***  Follow-Up Instructions: No follow-ups on file.   Orders:  No orders of the defined types were placed in this encounter.  No orders of the defined types were placed in this encounter.     Procedures: No procedures performed   Clinical Data: No additional findings.   Subjective: No chief complaint on file.   HPI  Review of Systems  Constitutional: Negative.   HENT: Negative.    Eyes: Negative.   Respiratory: Negative.    Cardiovascular: Negative.   Endocrine: Negative.   Musculoskeletal: Negative.   Neurological: Negative.   Hematological: Negative.   Psychiatric/Behavioral: Negative.    All other systems reviewed and are negative.   Objective: Vital Signs: LMP 02/18/2011   Physical Exam Vitals and nursing note reviewed.  Constitutional:      Appearance: She is well-developed.  HENT:     Head: Normocephalic and atraumatic.  Pulmonary:     Effort: Pulmonary effort is normal.  Abdominal:     Palpations: Abdomen is soft.  Musculoskeletal:     Cervical back: Neck supple.  Skin:    General: Skin is warm.     Capillary Refill: Capillary refill takes less than 2 seconds.  Neurological:     Mental Status: She is alert and oriented to person, place, and time.  Psychiatric:        Behavior: Behavior normal.        Thought Content: Thought content normal.        Judgment: Judgment normal.   Ortho Exam  Specialty Comments:  No specialty comments available.  Imaging: No results found.   PMFS History: Patient Active Problem List   Diagnosis Date Noted  . Abdominal pain 02/24/2023  . Blurry vision 11/30/2022  . Atypical  chest pain 08/20/2022  . Prediabetes 02/25/2022  . Acute medial meniscus tear of left knee 08/18/2021  . Primary osteoarthritis of left knee 08/18/2021  . Breast pain, right 05/16/2020  . Hyperlipidemia LDL goal <130 12/31/2019  . Urinary incontinence 09/30/2017  . Routine general medical examination at a health care facility 09/30/2017  . Morbid obesity (HCC) 10/16/2009  . Hypothyroidism 07/11/2009  . Paranoid schizophrenia in remission (HCC) 06/24/2009  . Essential hypertension 06/24/2009  . HX, PERSONAL, MALIGNANCY, BREAST 12/07/2006   Past Medical History:  Diagnosis Date  . Bipolar disorder (HCC)   . Breast cancer (HCC)    with chemo - 1997  . Hypertension   . Personal history of chemotherapy   . Syphilis   . Thyroid disease   . Thyroid disease     Family History  Problem Relation Age of Onset  . Cancer Mother        ? type  . Stroke Father   . Breast cancer Sister        78's  . Diabetes Brother   . Coronary artery disease Brother   . Colon cancer Neg Hx   . Esophageal cancer Neg Hx   . Rectal cancer Neg Hx   . Stomach cancer  Neg Hx     Past Surgical History:  Procedure Laterality Date  . BREAST BIOPSY  1997  . MASTECTOMY     left breat mastectomy 1997  . TUBAL LIGATION     1992   Social History   Occupational History  . Not on file  Tobacco Use  . Smoking status: Former    Current packs/day: 0.50    Average packs/day: 0.5 packs/day for 37.0 years (18.5 ttl pk-yrs)    Types: Cigarettes  . Smokeless tobacco: Never  Vaping Use  . Vaping status: Former  Substance and Sexual Activity  . Alcohol use: No    Comment: Pt denies  . Drug use: No    Comment: Pt denies   . Sexual activity: Yes    Birth control/protection: Surgical    Comment: 1st intercourse 62 yo-More than 5 partners

## 2023-03-29 ENCOUNTER — Ambulatory Visit: Payer: Medicare Other | Admitting: Orthopaedic Surgery

## 2023-03-29 DIAGNOSIS — M1712 Unilateral primary osteoarthritis, left knee: Secondary | ICD-10-CM | POA: Diagnosis not present

## 2023-03-29 MED ORDER — METHYLPREDNISOLONE ACETATE 40 MG/ML IJ SUSP
40.0000 mg | INTRAMUSCULAR | Status: AC | PRN
Start: 2023-03-29 — End: 2023-03-29
  Administered 2023-03-29: 40 mg via INTRA_ARTICULAR

## 2023-03-29 MED ORDER — BUPIVACAINE HCL 0.5 % IJ SOLN
2.0000 mL | INTRAMUSCULAR | Status: AC | PRN
Start: 2023-03-29 — End: 2023-03-29
  Administered 2023-03-29: 2 mL via INTRA_ARTICULAR

## 2023-03-29 MED ORDER — LIDOCAINE HCL 1 % IJ SOLN
2.0000 mL | INTRAMUSCULAR | Status: AC | PRN
Start: 2023-03-29 — End: 2023-03-29
  Administered 2023-03-29: 2 mL

## 2023-04-01 DIAGNOSIS — H2513 Age-related nuclear cataract, bilateral: Secondary | ICD-10-CM | POA: Diagnosis not present

## 2023-04-01 DIAGNOSIS — H35033 Hypertensive retinopathy, bilateral: Secondary | ICD-10-CM | POA: Diagnosis not present

## 2023-04-01 DIAGNOSIS — E0500 Thyrotoxicosis with diffuse goiter without thyrotoxic crisis or storm: Secondary | ICD-10-CM | POA: Diagnosis not present

## 2023-04-05 NOTE — Progress Notes (Signed)
This encounter was created in error - please disregard.This encounter was created in error - please disregard.  I called patient x2 and left a message that I will call back shortly.  I called back the 3rd time and patient informed me that she could not do the visit today.  I asked patient to call the office to reschedule when she get a chance.

## 2023-04-25 DIAGNOSIS — I10 Essential (primary) hypertension: Secondary | ICD-10-CM | POA: Diagnosis not present

## 2023-04-25 DIAGNOSIS — R7303 Prediabetes: Secondary | ICD-10-CM | POA: Diagnosis not present

## 2023-04-25 DIAGNOSIS — H2513 Age-related nuclear cataract, bilateral: Secondary | ICD-10-CM | POA: Diagnosis not present

## 2023-04-25 DIAGNOSIS — M1712 Unilateral primary osteoarthritis, left knee: Secondary | ICD-10-CM | POA: Diagnosis not present

## 2023-04-25 DIAGNOSIS — Z0000 Encounter for general adult medical examination without abnormal findings: Secondary | ICD-10-CM | POA: Diagnosis not present

## 2023-04-25 DIAGNOSIS — H35033 Hypertensive retinopathy, bilateral: Secondary | ICD-10-CM | POA: Diagnosis not present

## 2023-04-25 DIAGNOSIS — F17211 Nicotine dependence, cigarettes, in remission: Secondary | ICD-10-CM | POA: Diagnosis not present

## 2023-04-25 DIAGNOSIS — Z79899 Other long term (current) drug therapy: Secondary | ICD-10-CM | POA: Diagnosis not present

## 2023-04-25 DIAGNOSIS — E039 Hypothyroidism, unspecified: Secondary | ICD-10-CM | POA: Diagnosis not present

## 2023-05-12 DIAGNOSIS — H25813 Combined forms of age-related cataract, bilateral: Secondary | ICD-10-CM | POA: Diagnosis not present

## 2023-05-30 ENCOUNTER — Other Ambulatory Visit: Payer: Self-pay | Admitting: Internal Medicine

## 2023-05-30 DIAGNOSIS — E039 Hypothyroidism, unspecified: Secondary | ICD-10-CM

## 2023-06-01 DIAGNOSIS — H268 Other specified cataract: Secondary | ICD-10-CM | POA: Diagnosis not present

## 2023-06-01 DIAGNOSIS — H25812 Combined forms of age-related cataract, left eye: Secondary | ICD-10-CM | POA: Diagnosis not present

## 2023-06-01 DIAGNOSIS — E039 Hypothyroidism, unspecified: Secondary | ICD-10-CM | POA: Diagnosis not present

## 2023-06-01 DIAGNOSIS — I10 Essential (primary) hypertension: Secondary | ICD-10-CM | POA: Diagnosis not present

## 2023-06-30 ENCOUNTER — Ambulatory Visit (INDEPENDENT_AMBULATORY_CARE_PROVIDER_SITE_OTHER): Admitting: Orthopaedic Surgery

## 2023-06-30 DIAGNOSIS — M1712 Unilateral primary osteoarthritis, left knee: Secondary | ICD-10-CM

## 2023-06-30 MED ORDER — LIDOCAINE HCL 1 % IJ SOLN
2.0000 mL | INTRAMUSCULAR | Status: AC | PRN
Start: 2023-06-30 — End: 2023-06-30
  Administered 2023-06-30: 2 mL

## 2023-06-30 MED ORDER — BUPIVACAINE HCL 0.5 % IJ SOLN
2.0000 mL | INTRAMUSCULAR | Status: AC | PRN
Start: 2023-06-30 — End: 2023-06-30
  Administered 2023-06-30: 2 mL via INTRA_ARTICULAR

## 2023-06-30 MED ORDER — METHYLPREDNISOLONE ACETATE 40 MG/ML IJ SUSP
40.0000 mg | INTRAMUSCULAR | Status: AC | PRN
Start: 2023-06-30 — End: 2023-06-30
  Administered 2023-06-30: 40 mg via INTRA_ARTICULAR

## 2023-06-30 NOTE — Progress Notes (Signed)
 Office Visit Note   Patient: Jacqueline Berry           Date of Birth: [birth year]           MRN: [id number] Visit Date: 06/30/2023              Requested by: Myrlene Broker, MD [address] [place],  [place] [address] PCP: Myrlene Broker, MD   Assessment & Plan: Visit Diagnoses:  1. Primary osteoarthritis of left knee     Plan: Jacqueline Berry underwent left knee cortisone injection today.  She tolerates well.  Will see her back as needed.  Follow-Up Instructions: No follow-ups on file.   Orders:  No orders of the defined types were placed in this encounter.  No orders of the defined types were placed in this encounter.     Procedures: Large Joint Inj: L knee on 06/30/2023 8:53 AM Details: 22 G needle Medications: 2 mL bupivacaine 0.5 %; 2 mL lidocaine 1 %; 40 mg methylPREDNISolone acetate 40 MG/ML Outcome: tolerated well, no immediate complications Patient was prepped and draped in the usual sterile fashion.       Clinical Data: No additional findings.   Subjective: Chief Complaint  Patient presents with   Left Knee - Pain    HPI Jacqueline Berry comes in today for follow-up evaluation of left knee DJD.  She is requesting another injection today. Review of Systems   Objective: Vital Signs: LMP 02/18/2011   Physical Exam  Ortho Exam Examination of the left knee is unchanged from prior visit. Specialty Comments:  No specialty comments available.  Imaging: No results found.   PMFS History: Patient Active Problem List   Diagnosis Date Noted   Abdominal pain 02/24/2023   Blurry vision 11/30/2022   Atypical chest pain 08/20/2022   Prediabetes 02/25/2022   Acute medial meniscus tear of left knee 08/18/2021   Primary osteoarthritis of left knee 08/18/2021   Breast pain, right 05/16/2020   Hyperlipidemia LDL goal <130 12/31/2019   Urinary incontinence 09/30/2017   Routine general medical examination at a health care facility 09/30/2017   Morbid  obesity (HCC) 10/16/2009   Hypothyroidism 07/11/2009   Paranoid schizophrenia in remission (HCC) 06/24/2009   Essential hypertension 06/24/2009   HX, PERSONAL, MALIGNANCY, BREAST 12/07/2006   Past Medical History:  Diagnosis Date   Bipolar disorder (HCC)    Breast cancer (HCC)    with chemo - 1997   Hypertension    Personal history of chemotherapy    Syphilis    Thyroid disease    Thyroid disease     Family History  Problem Relation Age of Onset   Cancer Mother        ? type   Stroke Father    Breast cancer Sister        52's   Diabetes Brother    Coronary artery disease Brother    Colon cancer Neg Hx    Esophageal cancer Neg Hx    Rectal cancer Neg Hx    Stomach cancer Neg Hx     Past Surgical History:  Procedure Laterality Date   BREAST BIOPSY  1997   MASTECTOMY     left breat mastectomy 1997   TUBAL LIGATION     1992   Social History   Occupational History   Not on file  Tobacco Use   Smoking status: Former    Current packs/day: 0.50    Average packs/day: 0.5 packs/day for 37.0 years (18.5 ttl  pk-yrs)    Types: Cigarettes   Smokeless tobacco: Never  Vaping Use   Vaping status: Former  Substance and Sexual Activity   Alcohol use: No    Comment: Pt denies   Drug use: No    Comment: Pt denies    Sexual activity: Yes    Birth control/protection: Surgical    Comment: 1st intercourse 63 yo-More than 5 partners

## 2023-07-13 DIAGNOSIS — I10 Essential (primary) hypertension: Secondary | ICD-10-CM | POA: Diagnosis not present

## 2023-07-13 DIAGNOSIS — E039 Hypothyroidism, unspecified: Secondary | ICD-10-CM | POA: Diagnosis not present

## 2023-07-13 DIAGNOSIS — H25811 Combined forms of age-related cataract, right eye: Secondary | ICD-10-CM | POA: Diagnosis not present

## 2023-07-19 ENCOUNTER — Other Ambulatory Visit: Payer: Self-pay | Admitting: Internal Medicine

## 2023-07-19 DIAGNOSIS — E039 Hypothyroidism, unspecified: Secondary | ICD-10-CM

## 2023-08-01 ENCOUNTER — Encounter

## 2023-08-01 NOTE — Progress Notes (Signed)
 This encounter was created in error - please disregard.  I called patient and she did answer the phone, however she could not hear me.  I called back x3 and none of the times did patient hear me.

## 2023-08-29 ENCOUNTER — Encounter (HOSPITAL_COMMUNITY): Payer: Self-pay

## 2023-09-05 ENCOUNTER — Other Ambulatory Visit

## 2023-09-05 ENCOUNTER — Ambulatory Visit

## 2023-09-05 VITALS — BP 138/72 | HR 68 | Ht 64.0 in | Wt 216.6 lb

## 2023-09-05 DIAGNOSIS — Z124 Encounter for screening for malignant neoplasm of cervix: Secondary | ICD-10-CM

## 2023-09-05 DIAGNOSIS — Z1231 Encounter for screening mammogram for malignant neoplasm of breast: Secondary | ICD-10-CM

## 2023-09-05 DIAGNOSIS — Z122 Encounter for screening for malignant neoplasm of respiratory organs: Secondary | ICD-10-CM

## 2023-09-05 DIAGNOSIS — Z114 Encounter for screening for human immunodeficiency virus [HIV]: Secondary | ICD-10-CM

## 2023-09-05 DIAGNOSIS — Z1159 Encounter for screening for other viral diseases: Secondary | ICD-10-CM | POA: Diagnosis not present

## 2023-09-05 DIAGNOSIS — Z0000 Encounter for general adult medical examination without abnormal findings: Secondary | ICD-10-CM

## 2023-09-05 DIAGNOSIS — Z87891 Personal history of nicotine dependence: Secondary | ICD-10-CM

## 2023-09-05 NOTE — Patient Instructions (Signed)
 Jacqueline Berry , Thank you for taking time out of your busy schedule to complete your Annual Wellness Visit with me. I enjoyed our conversation and look forward to speaking with you again next year. I, as well as your care team,  appreciate your ongoing commitment to your health goals. Please review the following plan we discussed and let me know if I can assist you in the future. Your Game plan/ To Do List    Referrals: If you haven't heard from the office you've been referred to, please reach out to them at the phone provided.  Mammogram ordered, Lung Cancer Screening ordered, Referral to Gynecology, Dr Fenton House for a pap smear.  Ordered labwork > HIV and Hepatitis C Screening test Follow up Visits: Next Medicare AWV with our clinical staff: 09/07/2024   Have you seen your provider in the last 6 months (3 months if uncontrolled diabetes)? No Next Office Visit with your provider: 10/17/2023 - Physical  Clinician Recommendations:  Aim for 30 minutes of exercise or brisk walking, 6-8 glasses of water, and 5 servings of fruits and vegetables each day.       This is a list of the screening recommended for you and due dates:  Health Maintenance  Topic Date Due   HIV Screening  Never done   Hepatitis C Screening  Never done   Zoster (Shingles) Vaccine (1 of 2) Never done   Screening for Lung Cancer  Never done   Pap with HPV screening  03/31/2023   COVID-19 Vaccine (4 - 2024-25 season) 09/21/2023*   Flu Shot  11/18/2023   Medicare Annual Wellness Visit  09/04/2024   Mammogram  09/28/2024   DTaP/Tdap/Td vaccine (3 - Td or Tdap) 02/24/2028   Colon Cancer Screening  08/06/2028   HPV Vaccine  Aged Out   Meningitis B Vaccine  Aged Out   Cologuard (Stool DNA test)  Discontinued  *Topic was postponed. The date shown is not the original due date.    Advanced directives: (Declined) Advance directive discussed with you today. Even though you declined this today, please call our office should you  change your mind, and we can give you the proper paperwork for you to fill out. Advance Care Planning is important because it:  [x]  Makes sure you receive the medical care that is consistent with your values, goals, and preferences  [x]  It provides guidance to your family and loved ones and reduces their decisional burden about whether or not they are making the right decisions based on your wishes.  Follow the link provided in your after visit summary or read over the paperwork we have mailed to you to help you started getting your Advance Directives in place. If you need assistance in completing these, please reach out to us  so that we can help you!

## 2023-09-05 NOTE — Progress Notes (Signed)
 Subjective:   Jacqueline Berry is a 63 y.o. who presents for a Medicare Wellness preventive visit.  As a reminder, Annual Wellness Visits don't include a physical exam, and some assessments may be limited, especially if this visit is performed virtually. We may recommend an in-person follow-up visit with your provider if needed.  Visit Complete: In person  Persons Participating in Visit: Patient.  AWV Questionnaire: No: Patient Medicare AWV questionnaire was not completed prior to this visit.  Cardiac Risk Factors include: advanced age (>72men, >37 women);dyslipidemia;hypertension;obesity (BMI >30kg/m2)     Objective:     Today's Vitals   09/05/23 1509  BP: 138/72  Pulse: 68  Weight: 216 lb 9.6 oz (98.2 kg)  Height: 5\' 4"  (1.626 m)   Body mass index is 37.18 kg/m.     09/05/2023    3:09 PM 08/26/2021    2:55 PM 04/25/2021    2:00 PM 01/13/2021    3:22 PM 10/23/2020    8:23 AM 06/03/2020    4:09 PM 06/19/2019    4:39 PM  Advanced Directives  Does Patient Have a Medical Advance Directive? No No No No No No No  Would patient like information on creating a medical advance directive? No - Patient declined No - Patient declined  No - Patient declined No - Patient declined No - Patient declined No - Patient declined    Current Medications (verified) Outpatient Encounter Medications as of 09/05/2023  Medication Sig   amLODipine  (NORVASC ) 10 MG tablet TAKE 1 TABLET BY MOUTH EVERY DAY   benztropine (COGENTIN) 0.5 MG tablet Take 0.5 mg by mouth daily.    haloperidol  (HALDOL ) 5 MG tablet Take 2.5 mg by mouth daily.    haloperidol  decanoate (HALDOL  DECANOATE) 50 MG/ML injection Inject 100 mg into the muscle every 28 (twenty-eight) days.   levothyroxine  (SYNTHROID ) 100 MCG tablet TAKE 1 TABLET BY MOUTH EVERY DAY   lisinopril -hydrochlorothiazide  (ZESTORETIC ) 20-12.5 MG tablet TAKE 1 TABLET BY MOUTH EVERY DAY   No facility-administered encounter medications on file as of 09/05/2023.     Allergies (verified) Latex   History: Past Medical History:  Diagnosis Date   Bipolar disorder (HCC)    Breast cancer (HCC)    with chemo - 1997   Hypertension    Personal history of chemotherapy    Syphilis    Thyroid  disease    Thyroid  disease    Past Surgical History:  Procedure Laterality Date   BREAST BIOPSY  1997   MASTECTOMY     left breat mastectomy 1997   TUBAL LIGATION     1992   Family History  Problem Relation Age of Onset   Cancer Mother        ? type   Stroke Father    Breast cancer Sister        72's   Diabetes Brother    Coronary artery disease Brother    Colon cancer Neg Hx    Esophageal cancer Neg Hx    Rectal cancer Neg Hx    Stomach cancer Neg Hx    Social History   Socioeconomic History   Marital status: Married    Spouse name: Not on file   Number of children: Not on file   Years of education: Not on file   Highest education level: Not on file  Occupational History   Not on file  Tobacco Use   Smoking status: Former    Current packs/day: 0.50    Average packs/day: 1  pack/day for 50.4 years (48.7 ttl pk-yrs)    Types: Cigarettes    Start date: 04/19/1973    Passive exposure: Past   Smokeless tobacco: Never  Vaping Use   Vaping status: Former  Substance and Sexual Activity   Alcohol use: No    Comment: Pt denies   Drug use: No    Comment: Pt denies    Sexual activity: Yes    Birth control/protection: Surgical    Comment: 1st intercourse 63 yo-More than 5 partners  Other Topics Concern   Not on file  Social History Narrative   Not on file   Social Drivers of Health   Financial Resource Strain: Low Risk  (09/05/2023)   Overall Financial Resource Strain (CARDIA)    Difficulty of Paying Living Expenses: Not hard at all  Food Insecurity: No Food Insecurity (09/05/2023)   Hunger Vital Sign    Worried About Running Out of Food in the Last Year: Never true    Ran Out of Food in the Last Year: Never true  Transportation  Needs: No Transportation Needs (09/05/2023)   PRAPARE - Administrator, Civil Service (Medical): No    Lack of Transportation (Non-Medical): No  Physical Activity: Inactive (09/05/2023)   Exercise Vital Sign    Days of Exercise per Week: 0 days    Minutes of Exercise per Session: 0 min  Stress: No Stress Concern Present (09/05/2023)   [employer] of Occupational Health - Occupational Stress Questionnaire    Feeling of Stress : Not at all  Social Connections: Moderately Isolated (09/05/2023)   Social Connection and Isolation Panel [NHANES]    Frequency of Communication with Friends and Family: More than three times a week    Frequency of Social Gatherings with Friends and Family: Never    Attends Religious Services: Never    Database administrator or Organizations: No    Attends Engineer, structural: Never    Marital Status: Married    Tobacco Counseling Counseling given: No    Clinical Intake:  Pre-visit preparation completed: Yes  Pain : No/denies pain     BMI - recorded: 37.18 Nutritional Risks: None Diabetes: No  Lab Results  Component Value Date   HGBA1C 6.0 11/30/2022   HGBA1C 6.2 02/17/2022   HGBA1C 5.8 02/11/2021     How often do you need to have someone help you when you read instructions, pamphlets, or other written materials from your doctor or pharmacy?: 1 - Never  Interpreter Needed?: No  Information entered by :: Kandy Orris, CMA   Activities of Daily Living     09/05/2023    3:13 PM  In your present state of health, do you have any difficulty performing the following activities:  Hearing? 0  Vision? 0  Difficulty concentrating or making decisions? 0  Walking or climbing stairs? 0  Dressing or bathing? 0  Doing errands, shopping? 0  Preparing Food and eating ? N  Using the Toilet? N  In the past six months, have you accidently leaked urine? Y  Comment wears a pad  Do you have problems with loss of bowel control? N   Managing your Medications? N  Managing your Finances? N  Housekeeping or managing your Housekeeping? N    Patient Care Team: Adelia Homestead, MD as PCP - General (Internal Medicine) Ferrell Hu, MD (Internal Medicine) Silverio Drought, MD as Consulting Physician (Ophthalmology) Wes Hamman, MD as Attending Physician (Orthopedic Surgery)  Indicate any recent Medical Services you may have received from other than [facility] providers in the past year (date may be approximate).     Assessment:    This is a routine wellness examination for Eadie.  Hearing/Vision screen Hearing Screening - Comments:: Denies hearing difficulties   Vision Screening - Comments:: Wears eyeglasses for reading only - Sees Dr Mason Sole   Goals Addressed               This Visit's Progress     Patient Stated (pt-stated)        Patient stated she plans to eat healthy.       Depression Screen     09/05/2023    3:15 PM 11/30/2022    1:47 PM 08/19/2022    9:53 AM 02/17/2022   10:42 AM 06/03/2020    4:05 PM 03/03/2018    1:39 PM 09/29/2017    3:32 PM  PHQ 2/9 Scores  PHQ - 2 Score 0 2 0 0 2 0 0  PHQ- 9 Score 0 8  0       Fall Risk     09/05/2023    3:14 PM 02/22/2023    2:28 PM 08/19/2022    9:53 AM 02/17/2022   10:42 AM 06/03/2020    4:09 PM  Fall Risk   Falls in the past year? 0 0 0 0 0  Number falls in past yr: 0 0 0 0 0  Injury with Fall? 0 0 0 0 0  Risk for fall due to : No Fall Risks    No Fall Risks  Follow up Falls prevention discussed;Falls evaluation completed Falls evaluation completed Falls evaluation completed      MEDICARE RISK AT HOME:  Medicare Risk at Home Any stairs in or around the home?: Yes If so, are there any without handrails?: Yes Home free of loose throw rugs in walkways, pet beds, electrical cords, etc?: Yes Adequate lighting in your home to reduce risk of falls?: Yes Life alert?: No Use of a cane, walker or w/c?: No Grab bars in the bathroom?: No Shower  chair or bench in shower?: No Elevated toilet seat or a handicapped toilet?: No  TIMED UP AND GO:  Was the test performed?  No  Cognitive Function: 6CIT completed        09/05/2023    3:19 PM  6CIT Screen  What Year? 0 points  What month? 0 points  What time? 0 points  Count back from 20 0 points  Months in reverse 0 points  Repeat phrase 0 points  Total Score 0 points    Immunizations Immunization History  Administered Date(s) Administered   PFIZER Comirnaty(Gray Top)Covid-19 Tri-Sucrose Vaccine 05/14/2020   PFIZER(Purple Top)SARS-COV-2 Vaccination 07/20/2019, 08/10/2019   Td 04/20/2007   Tdap 02/23/2018    Screening Tests Health Maintenance  Topic Date Due   HIV Screening  Never done   Hepatitis C Screening  Never done   Zoster Vaccines- Shingrix (1 of 2) Never done   Lung Cancer Screening  Never done   Cervical Cancer Screening (HPV/Pap Cotest)  03/31/2023   COVID-19 Vaccine (4 - 2024-25 season) 09/21/2023 (Originally 12/19/2022)   INFLUENZA VACCINE  11/18/2023   Medicare Annual Wellness (AWV)  09/04/2024   MAMMOGRAM  09/28/2024   DTaP/Tdap/Td (3 - Td or Tdap) 02/24/2028   Colonoscopy  08/06/2028   HPV VACCINES  Aged Out   Meningococcal B Vaccine  Aged Out   Fecal DNA (Cologuard)  Discontinued  Health Maintenance  Health Maintenance Due  Topic Date Due   HIV Screening  Never done   Hepatitis C Screening  Never done   Zoster Vaccines- Shingrix (1 of 2) Never done   Lung Cancer Screening  Never done   Cervical Cancer Screening (HPV/Pap Cotest)  03/31/2023   Health Maintenance Items Addressed:  Mammogram ordered, Lung Cancer Screening ordered, Referral to Gynecology, Dr Fenton House for a pap smear; Ordered HIV and Hepatitis C Screening tests.  Additional Screening:  Vision Screening: Recommended annual ophthalmology exams for early detection of glaucoma and other disorders of the eye.  Dental Screening: Recommended annual dental exams for proper  oral hygiene  Community Resource Referral / Chronic Care Management: CRR required this visit?  No   CCM required this visit?  No   Plan:    I have personally reviewed and noted the following in the patient's chart:   Medical and social history Use of alcohol, tobacco or illicit drugs  Current medications and supplements including opioid prescriptions. Patient is not currently taking opioid prescriptions. Functional ability and status Nutritional status Physical activity Advanced directives List of other physicians Hospitalizations, surgeries, and ER visits in previous 12 months Vitals Screenings to include cognitive, depression, and falls Referrals and appointments  In addition, I have reviewed and discussed with patient certain preventive protocols, quality metrics, and best practice recommendations. A written personalized care plan for preventive services as well as general preventive health recommendations were provided to patient.   Patria Bookbinder, CMA   09/05/2023   After Visit Summary: (In Person-Declined) Patient declined AVS at this time.  Notes: Nothing significant to report at this time.

## 2023-09-06 ENCOUNTER — Ambulatory Visit: Payer: Self-pay | Admitting: Internal Medicine

## 2023-09-06 LAB — HEPATITIS C ANTIBODY: Hepatitis C Ab: NONREACTIVE

## 2023-09-20 ENCOUNTER — Encounter: Payer: Self-pay | Admitting: Orthopaedic Surgery

## 2023-09-20 ENCOUNTER — Ambulatory Visit: Admitting: Orthopaedic Surgery

## 2023-09-20 DIAGNOSIS — M1712 Unilateral primary osteoarthritis, left knee: Secondary | ICD-10-CM

## 2023-09-20 MED ORDER — BUPIVACAINE HCL 0.5 % IJ SOLN
2.0000 mL | INTRAMUSCULAR | Status: AC | PRN
  Administered 2023-09-20: 2 mL via INTRA_ARTICULAR

## 2023-09-20 MED ORDER — METHYLPREDNISOLONE ACETATE 40 MG/ML IJ SUSP
40.0000 mg | INTRAMUSCULAR | Status: AC | PRN
Start: 2023-09-20 — End: 2023-09-20
  Administered 2023-09-20: 40 mg via INTRA_ARTICULAR

## 2023-09-20 MED ORDER — LIDOCAINE HCL 1 % IJ SOLN
2.0000 mL | INTRAMUSCULAR | Status: AC | PRN
  Administered 2023-09-20: 2 mL

## 2023-09-20 NOTE — Progress Notes (Signed)
 Office Visit Note   Patient: Jacqueline Berry           Date of Birth: Oct 11, 1960           MRN: [id number] Visit Date: 09/20/2023              Requested by: Adelia Homestead, MD [address] [place],  [place] [address] PCP: Adelia Homestead, MD   Assessment & Plan: Visit Diagnoses:  1. Primary osteoarthritis of left knee     Plan: History of Present Illness Jacqueline Berry is a 63 year old female who presents for a cortisone injection in her left knee.  She experiences recurrent left knee pain, previously managed with cortisone injections. Relief from the last injection has diminished, necessitating another injection. She has not undergone knee replacement surgery. No other symptoms or issues related to her knee are present.  Physical Exam MUSCULOSKELETAL: Left knee exam unchanged from prior visit.  Assessment and Plan Left knee osteoarthritis - Administered cortisone injection to left knee.  Follow-Up Instructions: No follow-ups on file.   Orders:  No orders of the defined types were placed in this encounter.  No orders of the defined types were placed in this encounter.     Procedures: Large Joint Inj: L knee on 09/20/2023 3:00 PM Details: 22 G needle Medications: 2 mL bupivacaine  0.5 %; 2 mL lidocaine  1 %; 40 mg methylPREDNISolone  acetate 40 MG/ML Outcome: tolerated well, no immediate complications Patient was prepped and draped in the usual sterile fashion.     Subjective: Chief Complaint  Patient presents with   Left Knee - Pain    HPI  Review of Systems  Constitutional: Negative.   HENT: Negative.    Eyes: Negative.   Respiratory: Negative.    Cardiovascular: Negative.   Endocrine: Negative.   Musculoskeletal: Negative.   Neurological: Negative.   Hematological: Negative.   Psychiatric/Behavioral: Negative.    All other systems reviewed and are negative.    Objective: Vital Signs: LMP 02/18/2011   Physical Exam Vitals and  nursing note reviewed.  Constitutional:      Appearance: She is well-developed.  HENT:     Head: Atraumatic.     Nose: Nose normal.  Eyes:     Extraocular Movements: Extraocular movements intact.  Cardiovascular:     Pulses: Normal pulses.  Pulmonary:     Effort: Pulmonary effort is normal.  Abdominal:     Palpations: Abdomen is soft.  Musculoskeletal:     Cervical back: Neck supple.  Skin:    General: Skin is warm.     Capillary Refill: Capillary refill takes less than 2 seconds.  Neurological:     Mental Status: She is alert. Mental status is at baseline.  Psychiatric:        Behavior: Behavior normal.        Thought Content: Thought content normal.        Judgment: Judgment normal.     Ortho Exam  Specialty Comments:  No specialty comments available.  Imaging: No results found.   PMFS History: Patient Active Problem List   Diagnosis Date Noted   Abdominal pain 02/24/2023   Blurry vision 11/30/2022   Atypical chest pain 08/20/2022   Prediabetes 02/25/2022   Acute medial meniscus tear of left knee 08/18/2021   Primary osteoarthritis of left knee 08/18/2021   Breast pain, right 05/16/2020   Hyperlipidemia LDL goal <130 12/31/2019   Urinary incontinence 09/30/2017   Routine general medical examination  at a health care facility 09/30/2017   Morbid obesity (HCC) 10/16/2009   Hypothyroidism 07/11/2009   Paranoid schizophrenia in remission (HCC) 06/24/2009   Essential hypertension 06/24/2009   HX, PERSONAL, MALIGNANCY, BREAST 12/07/2006   Past Medical History:  Diagnosis Date   Bipolar disorder (HCC)    Breast cancer (HCC)    with chemo - 1997   Hypertension    Personal history of chemotherapy    Syphilis    Thyroid  disease    Thyroid  disease     Family History  Problem Relation Age of Onset   Cancer Mother        ? type   Stroke Father    Breast cancer Sister        107's   Diabetes Brother    Coronary artery disease Brother    Colon cancer Neg  Hx    Esophageal cancer Neg Hx    Rectal cancer Neg Hx    Stomach cancer Neg Hx     Past Surgical History:  Procedure Laterality Date   BREAST BIOPSY  1997   MASTECTOMY     left breat mastectomy 1997   TUBAL LIGATION     1992   Social History   Occupational History   Not on file  Tobacco Use   Smoking status: Former    Current packs/day: 0.50    Average packs/day: 1 pack/day for 50.4 years (48.7 ttl pk-yrs)    Types: Cigarettes    Start date: 04/19/1973    Passive exposure: Past   Smokeless tobacco: Never  Vaping Use   Vaping status: Former  Substance and Sexual Activity   Alcohol use: No    Comment: Pt denies   Drug use: No    Comment: Pt denies    Sexual activity: Yes    Birth control/protection: Surgical    Comment: 1st intercourse 63 yo-More than 5 partners

## 2023-10-05 ENCOUNTER — Telehealth: Payer: Self-pay | Admitting: Internal Medicine

## 2023-10-05 NOTE — Telephone Encounter (Signed)
 Copied from CRM [id number]. Topic: General - Other >> Oct 05, 2023  8:34 AM Annelle Kiel wrote: Reason for CRM: patient is requesting a call back fro dr Nicolette Barrio wellness nurse

## 2023-10-10 ENCOUNTER — Ambulatory Visit
Admission: RE | Admit: 2023-10-10 | Discharge: 2023-10-10 | Disposition: A | Discharge status: Home / Self Care | Source: Ambulatory Visit | Attending: Internal Medicine | Admitting: Internal Medicine

## 2023-10-10 DIAGNOSIS — Z1231 Encounter for screening mammogram for malignant neoplasm of breast: Secondary | ICD-10-CM | POA: Diagnosis not present

## 2023-10-14 ENCOUNTER — Other Ambulatory Visit: Payer: Self-pay | Admitting: Internal Medicine

## 2023-10-17 ENCOUNTER — Ambulatory Visit (INDEPENDENT_AMBULATORY_CARE_PROVIDER_SITE_OTHER): Admitting: Internal Medicine

## 2023-10-17 ENCOUNTER — Encounter: Payer: Self-pay | Admitting: Internal Medicine

## 2023-10-17 VITALS — BP 114/80 | HR 81 | Temp 98.4°F | Ht 64.0 in | Wt 219.0 lb

## 2023-10-17 DIAGNOSIS — I10 Essential (primary) hypertension: Secondary | ICD-10-CM

## 2023-10-17 DIAGNOSIS — R7303 Prediabetes: Secondary | ICD-10-CM | POA: Diagnosis not present

## 2023-10-17 DIAGNOSIS — E785 Hyperlipidemia, unspecified: Secondary | ICD-10-CM | POA: Diagnosis not present

## 2023-10-17 DIAGNOSIS — Z0000 Encounter for general adult medical examination without abnormal findings: Secondary | ICD-10-CM

## 2023-10-17 DIAGNOSIS — E039 Hypothyroidism, unspecified: Secondary | ICD-10-CM

## 2023-10-17 DIAGNOSIS — R0789 Other chest pain: Secondary | ICD-10-CM

## 2023-10-17 DIAGNOSIS — F200 Paranoid schizophrenia: Secondary | ICD-10-CM

## 2023-10-17 DIAGNOSIS — Z720 Tobacco use: Secondary | ICD-10-CM

## 2023-10-17 LAB — LIPID PANEL
Cholesterol: 165 mg/dL (ref 0–200)
HDL: 37.1 mg/dL — ABNORMAL LOW (ref >39.00)
LDL Cholesterol: 101 mg/dL — ABNORMAL HIGH (ref 0–99)
NonHDL: 127.85
Total CHOL/HDL Ratio: 4
Triglycerides: 135 mg/dL (ref 0.0–149.0)
VLDL: 27 mg/dL (ref 0.0–40.0)

## 2023-10-17 LAB — CBC
HCT: 39.7 % (ref 36.0–46.0)
Hemoglobin: 13 g/dL (ref 12.0–15.0)
MCHC: 32.9 g/dL (ref 30.0–36.0)
MCV: 84.3 fl (ref 78.0–100.0)
Platelets: 264 10*3/uL (ref 150.0–400.0)
RBC: 4.71 Mil/uL (ref 3.87–5.11)
RDW: 15.2 % (ref 11.5–15.5)
WBC: 6.8 10*3/uL (ref 4.0–10.5)

## 2023-10-17 LAB — COMPREHENSIVE METABOLIC PANEL WITH GFR
ALT: 21 U/L (ref 0–35)
AST: 21 U/L (ref 0–37)
Albumin: 4.1 g/dL (ref 3.5–5.2)
Alkaline Phosphatase: 85 U/L (ref 39–117)
BUN: 10 mg/dL (ref 6–23)
CO2: 30 meq/L (ref 19–32)
Calcium: 9.7 mg/dL (ref 8.4–10.5)
Chloride: 103 meq/L (ref 96–112)
Creatinine, Ser: 0.81 mg/dL (ref 0.40–1.20)
GFR: 77.02 mL/min (ref >60.00)
Glucose, Bld: 143 mg/dL — ABNORMAL HIGH (ref 70–99)
Potassium: 3.2 meq/L — ABNORMAL LOW (ref 3.5–5.1)
Sodium: 142 meq/L (ref 135–145)
Total Bilirubin: 0.5 mg/dL (ref 0.2–1.2)
Total Protein: 7.3 g/dL (ref 6.0–8.3)

## 2023-10-17 LAB — HEMOGLOBIN A1C: Hgb A1c MFr Bld: 6.4 % (ref 4.6–6.5)

## 2023-10-17 LAB — TSH: TSH: 1.07 u[IU]/mL (ref 0.35–5.50)

## 2023-10-17 LAB — T4, FREE: Free T4: 0.89 ng/dL (ref 0.60–1.60)

## 2023-10-17 MED ORDER — LEVOTHYROXINE SODIUM 100 MCG PO TABS: 100.0000 ug | ORAL_TABLET | Freq: Every day | ORAL | 3 refills | Status: AC

## 2023-10-17 MED ORDER — LISINOPRIL-HYDROCHLOROTHIAZIDE 20-12.5 MG PO TABS: 1.0000 | ORAL_TABLET | Freq: Every day | ORAL | 3 refills | Status: AC

## 2023-10-17 MED ORDER — AMLODIPINE BESYLATE 10 MG PO TABS: 10.0000 mg | ORAL_TABLET | Freq: Every day | ORAL | 3 refills | Status: AC

## 2023-10-17 NOTE — Assessment & Plan Note (Signed)
 Checking lipid panel and adjust as needed.

## 2023-10-17 NOTE — Assessment & Plan Note (Signed)
 Flu shot yearly. Pneumonia complete. Shingrix due at pharmacy. Tetanus up to date. Colonoscopy up to date. Mammogram up to date, pap smear with gyn and dexa with gyn. Counseled about sun safety and mole surveillance. Counseled about the dangers of distracted driving. Given 10 year screening recommendations.

## 2023-10-17 NOTE — Assessment & Plan Note (Signed)
 Checking HgA1c and adjust as needed.

## 2023-10-17 NOTE — Assessment & Plan Note (Signed)
 Checking TSH and free T4 and adjust as needed levothyroxine  100 mcg daily.

## 2023-10-17 NOTE — Assessment & Plan Note (Signed)
 Checking CMP and adjust as needed. BP at goal on lisinopril /hydrochlorothiazide  and amlodipine  which were refilled.

## 2023-10-17 NOTE — Assessment & Plan Note (Signed)
 BMi 36 and complicated by pre-diabetes, hyperlipidemia, hypertension. Counseled about diet and exercise and she declines nutrition referral.

## 2023-10-17 NOTE — Progress Notes (Signed)
   Subjective:   Patient ID: Jacqueline Berry, female    DOB: 15-Jan-1961, 63 y.o.   MRN: [id number]  HPI The patient is here for physical.  PMH, [facility], social history reviewed and updated  Review of Systems  Constitutional: Negative.   HENT: Negative.    Eyes: Negative.   Respiratory:  Positive for shortness of breath. Negative for cough and chest tightness.   Cardiovascular:  Negative for chest pain, palpitations and leg swelling.  Gastrointestinal:  Negative for abdominal distention, abdominal pain, constipation, diarrhea, nausea and vomiting.  Musculoskeletal:  Positive for arthralgias.  Skin: Negative.   Neurological: Negative.   Psychiatric/Behavioral: Negative.      Objective:  Physical Exam Constitutional:      Appearance: She is well-developed. She is obese.  HENT:     Head: Normocephalic and atraumatic.   Cardiovascular:     Rate and Rhythm: Normal rate and regular rhythm.  Pulmonary:     Effort: Pulmonary effort is normal. No respiratory distress.     Breath sounds: Normal breath sounds. No wheezing or rales.  Abdominal:     General: Bowel sounds are normal. There is no distension.     Palpations: Abdomen is soft.     Tenderness: There is no abdominal tenderness. There is no rebound.   Musculoskeletal:        General: Tenderness present.     Cervical back: Normal range of motion.   Skin:    General: Skin is warm and dry.   Neurological:     Mental Status: She is alert and oriented to person, place, and time.     Coordination: Coordination normal.     Vitals:   10/17/23 1045  BP: 114/80  Pulse: 81  Temp: 98.4 F (36.9 C)  TempSrc: Oral  SpO2: 99%  Weight: 219 lb (99.3 kg)  Height: 5' 4 (1.626 m)    Assessment & Plan:

## 2023-10-17 NOTE — Assessment & Plan Note (Signed)
 Overall stable and not changed from prior.

## 2023-10-17 NOTE — Assessment & Plan Note (Signed)
 Overall stable on cogentin and haldol  and trazodone . Seeing psych for management.

## 2023-10-18 ENCOUNTER — Ambulatory Visit: Payer: Self-pay | Admitting: Internal Medicine

## 2023-10-18 ENCOUNTER — Other Ambulatory Visit: Payer: Self-pay | Admitting: Internal Medicine

## 2023-10-18 DIAGNOSIS — R7303 Prediabetes: Secondary | ICD-10-CM

## 2023-12-20 ENCOUNTER — Other Ambulatory Visit (INDEPENDENT_AMBULATORY_CARE_PROVIDER_SITE_OTHER)

## 2023-12-20 ENCOUNTER — Ambulatory Visit: Admitting: Orthopaedic Surgery

## 2023-12-20 DIAGNOSIS — M25561 Pain in right knee: Secondary | ICD-10-CM

## 2023-12-20 DIAGNOSIS — M1712 Unilateral primary osteoarthritis, left knee: Secondary | ICD-10-CM

## 2023-12-20 MED ORDER — LIDOCAINE HCL 1 % IJ SOLN
2.0000 mL | INTRAMUSCULAR | Status: AC | PRN
  Administered 2023-12-20: 2 mL

## 2023-12-20 MED ORDER — METHYLPREDNISOLONE ACETATE 40 MG/ML IJ SUSP
40.0000 mg | INTRAMUSCULAR | Status: AC | PRN
  Administered 2023-12-20: 40 mg via INTRA_ARTICULAR

## 2023-12-20 MED ORDER — BUPIVACAINE HCL 0.5 % IJ SOLN
2.0000 mL | INTRAMUSCULAR | Status: AC | PRN
  Administered 2023-12-20: 2 mL via INTRA_ARTICULAR

## 2023-12-20 NOTE — Progress Notes (Signed)
 Office Visit Note   Patient: Jacqueline Berry           Date of Birth: 04-26-1960           MRN: [id number] Visit Date: 12/20/2023              Requested by: Rollene Almarie LABOR, MD [address] [place],  [place] [address] PCP: Rollene Almarie LABOR, MD   Assessment & Plan: Visit Diagnoses:  1. Primary osteoarthritis of left knee     Plan: History of Present Illness ERMINA OBERMAN is a 63 year old female who presents for a knee injection due to chronic left knee pain.  She has persistent left knee pain and has received eleven knee injections in the past. The most recent injection in June provided relief for approximately eight weeks. She continues to seek injections for pain management to avoid missing work.  Left knee exam unchanged  Assessment and Plan Left knee osteoarthritis Chronic degeneration requiring repeated injections. Surgery discussed but deferred due to work constraints. - Administer knee injection for symptomatic relief.  Follow-Up Instructions: No follow-ups on file.   Orders:  Orders Placed This Encounter  Procedures   XR KNEE 3 VIEW LEFT   No orders of the defined types were placed in this encounter.     Procedures: Large Joint Inj: L knee on 12/20/2023 7:56 AM Details: 22 G needle Medications: 2 mL bupivacaine  0.5 %; 2 mL lidocaine  1 %; 40 mg methylPREDNISolone  acetate 40 MG/ML Outcome: tolerated well, no immediate complications Patient was prepped and draped in the usual sterile fashion.       Clinical Data: No additional findings.   Subjective: Chief Complaint  Patient presents with   Left Knee - Follow-up    HPI  Review of Systems  Constitutional: Negative.   HENT: Negative.    Eyes: Negative.   Respiratory: Negative.    Cardiovascular: Negative.   Endocrine: Negative.   Musculoskeletal: Negative.   Neurological: Negative.   Hematological: Negative.   Psychiatric/Behavioral: Negative.    All other systems reviewed and  are negative.    Objective: Vital Signs: LMP 02/18/2011   Physical Exam Vitals and nursing note reviewed.  Constitutional:      Appearance: She is well-developed.  HENT:     Head: Normocephalic and atraumatic.  Pulmonary:     Effort: Pulmonary effort is normal.  Abdominal:     Palpations: Abdomen is soft.  Musculoskeletal:     Cervical back: Neck supple.  Skin:    General: Skin is warm.     Capillary Refill: Capillary refill takes less than 2 seconds.  Neurological:     Mental Status: She is alert and oriented to person, place, and time.  Psychiatric:        Behavior: Behavior normal.        Thought Content: Thought content normal.        Judgment: Judgment normal.     Ortho Exam  Specialty Comments:  No specialty comments available.  Imaging: XR KNEE 3 VIEW LEFT Result Date: 12/20/2023 X-rays of the left knee show advanced DJD of the medial compartment with a OCD lesion of the medial femoral condyle.    PMFS History: Patient Active Problem List   Diagnosis Date Noted   Blurry vision 11/30/2022   Atypical chest pain 08/20/2022   Prediabetes 02/25/2022   Acute medial meniscus tear of left knee 08/18/2021   Primary osteoarthritis of left knee 08/18/2021   Breast pain,  right 05/16/2020   Hyperlipidemia LDL goal <130 12/31/2019   Urinary incontinence 09/30/2017   Routine general medical examination at a health care facility 09/30/2017   Morbid obesity (HCC) 10/16/2009   Hypothyroidism 07/11/2009   Paranoid schizophrenia in remission (HCC) 06/24/2009   Essential hypertension 06/24/2009   HX, PERSONAL, MALIGNANCY, BREAST 12/07/2006   Past Medical History:  Diagnosis Date   Bipolar disorder (HCC)    Breast cancer (HCC)    with chemo - 1997   Hypertension    Personal history of chemotherapy    Syphilis    Thyroid  disease    Thyroid  disease     Family History  Problem Relation Age of Onset   Cancer Mother        ? type   Stroke Father    Breast cancer  Sister        88's   Diabetes Brother    Coronary artery disease Brother    Colon cancer Neg Hx    Esophageal cancer Neg Hx    Rectal cancer Neg Hx    Stomach cancer Neg Hx     Past Surgical History:  Procedure Laterality Date   BREAST BIOPSY  1997   MASTECTOMY     left breat mastectomy 1997   TUBAL LIGATION     1992   Social History   Occupational History   Not on file  Tobacco Use   Smoking status: Former    Current packs/day: 0.50    Average packs/day: 1 pack/day for 50.7 years (48.8 ttl pk-yrs)    Types: Cigarettes    Start date: 04/19/1973    Passive exposure: Past   Smokeless tobacco: Never  Vaping Use   Vaping status: Former  Substance and Sexual Activity   Alcohol use: No    Comment: Pt denies   Drug use: No    Comment: Pt denies    Sexual activity: Yes    Birth control/protection: Surgical    Comment: 1st intercourse 63 yo-More than 5 partners

## 2023-12-27 ENCOUNTER — Ambulatory Visit: Admitting: Orthopaedic Surgery

## 2024-02-14 ENCOUNTER — Telehealth: Payer: Self-pay

## 2024-02-14 NOTE — Telephone Encounter (Signed)
 Copied from CRM [id number]. Topic: Appointments - Scheduling Inquiry for Clinic >> Feb 14, 2024  2:05 PM Sasha M wrote: Reason for CRM: Pt called in today to tell Dr Rollene that she is ready to do the test for lung cancer. Please call pt to advise at [phone]

## 2024-02-15 NOTE — Telephone Encounter (Signed)
 Ok to place order for co-sign for ambulatory referral to lung cancer screening and associate with tobacco abuse thanks

## 2024-02-15 NOTE — Telephone Encounter (Signed)
 This has been placed for co sign

## 2024-02-15 NOTE — Addendum Note (Signed)
 Addended by: ROSALVA LEX RAMAN on: 02/15/2024 08:34 AM   Modules accepted: Orders

## 2024-02-20 ENCOUNTER — Encounter: Payer: Self-pay | Admitting: Radiology

## 2024-02-23 ENCOUNTER — Telehealth: Payer: Self-pay | Admitting: Acute Care

## 2024-02-23 DIAGNOSIS — Z122 Encounter for screening for malignant neoplasm of respiratory organs: Secondary | ICD-10-CM

## 2024-02-23 DIAGNOSIS — Z87891 Personal history of nicotine dependence: Secondary | ICD-10-CM

## 2024-02-23 NOTE — Telephone Encounter (Signed)
 Lung Cancer Screening Narrative/Criteria Questionnaire (Cigarette Smokers Only- No Cigars/Pipes/vapes)   Jacqueline Berry   SDMV:02/29/2024 10:15a [name]       1960-04-24   LDCT: 03/01/2024 8:30a GI    63 y.o.   Phone: [phone]  Lung Screening Narrative (confirm age 44-77 yrs Medicare / 50-80 yrs Private pay insurance)   Insurance information:[employer] mcr   Referring Provider:Dr. Rollene   This screening involves an initial phone call with a team member from our program. It is called a shared decision making visit. The initial meeting is required by  insurance and Medicare to make sure you understand the program. This appointment takes about 15-20 minutes to complete. You will complete the screening scan at your scheduled date/time.  This scan takes about 5-10 minutes to complete. You can eat and drink normally before and after the scan.  Criteria questions for Lung Cancer Screening:   Are you a current or former smoker? Former Age began smoking: 63yo   If you are a former smoker, what year did you quit smoking? 2021(within 15 yrs)   To calculate your smoking history, I need an accurate estimate of how many packs of cigarettes you smoked per day and for how many years. (Not just the number of PPD you are now smoking)   Years smoking 47 x Packs per day 1/2 = Pack years 23.5   (at least 20 pack yrs)   (Make sure they understand that we need to know how much they have smoked in the past, not just the number of PPD they are smoking now)  Do you have a personal history of cancer?  Yes - (type and when diagnosed - 5 yrs cancer free) Breast - dx in 1997 received surgery and therapy     Do you have a family history of cancer? Yes  (cancer type and and relative) sister - breast, brother - throat   Are you coughing up blood?  No  Have you had unexplained weight loss of 15 lbs or more in the last 6 months? No  It looks like you meet all criteria.  When would be a good time for us  to schedule  you for this screening?   Additional information: N/A

## 2024-02-29 ENCOUNTER — Ambulatory Visit: Admitting: Adult Health

## 2024-02-29 ENCOUNTER — Encounter: Payer: Self-pay | Admitting: Adult Health

## 2024-02-29 DIAGNOSIS — Z87891 Personal history of nicotine dependence: Secondary | ICD-10-CM

## 2024-02-29 NOTE — Progress Notes (Signed)
  Virtual Visit via Telephone Note  I connected with Jacqueline Berry , 02/29/24 10:25 AM by a telemedicine application and verified that I am speaking with the correct person using two identifiers.  Location: Patient: home Provider: home   I discussed the limitations of evaluation and management by telemedicine and the availability of in person appointments. The patient expressed understanding and agreed to proceed.   Shared Decision Making Visit Lung Cancer Screening Program [phone])   Eligibility: 63 y.o. Pack Years Smoking History Calculation = 23.5 pack years  (# packs/per year x # years smoked) Recent History of coughing up blood  no Unexplained weight loss? no ( >Than 15 pounds within the last 6 months ) Prior History Lung / other cancer no - remote hx breast cancer 1997  (Diagnosis within the last 5 years already requiring surveillance chest CT Scans). Smoking Status Former Smoker Former Smokers: Years since quit: 4 years  Quit Date: 2021  Visit Components: Discussion included one or more decision making aids. YES Discussion included risk/benefits of screening. YES Discussion included potential follow up diagnostic testing for abnormal scans. YES Discussion included meaning and risk of over diagnosis. YES Discussion included meaning and risk of False Positives. YES Discussion included meaning of total radiation exposure. YES  Counseling Included: Importance of adherence to annual lung cancer LDCT screening. YES Impact of comorbidities on ability to participate in the program. YES Ability and willingness to under diagnostic treatment. YES  Smoking Cessation Counseling: Former Smokers:  Discussed the importance of maintaining cigarette abstinence. yes Diagnosis Code: Personal History of Nicotine  Dependence. S12.108 Information about tobacco cessation classes and interventions provided to patient. Yes Patient provided with ticket for LDCT Scan. yes Written Order for  Lung Cancer Screening with LDCT placed in Epic. Yes (CT Chest Lung Cancer Screening Low Dose W/O CM) [id number]    Z12.2-Screening of respiratory organs Z87.891-Personal history of nicotine  dependence   [name] 02/29/24

## 2024-02-29 NOTE — Patient Instructions (Signed)

## 2024-03-01 ENCOUNTER — Ambulatory Visit
Admission: RE | Admit: 2024-03-01 | Discharge: 2024-03-01 | Disposition: A | Source: Ambulatory Visit | Attending: Acute Care | Admitting: Acute Care

## 2024-03-01 DIAGNOSIS — Z87891 Personal history of nicotine dependence: Secondary | ICD-10-CM

## 2024-03-01 DIAGNOSIS — Z122 Encounter for screening for malignant neoplasm of respiratory organs: Secondary | ICD-10-CM

## 2024-03-08 ENCOUNTER — Other Ambulatory Visit: Payer: Self-pay

## 2024-03-08 ENCOUNTER — Telehealth: Payer: Self-pay | Admitting: *Deleted

## 2024-03-08 DIAGNOSIS — Z87891 Personal history of nicotine dependence: Secondary | ICD-10-CM

## 2024-03-08 DIAGNOSIS — Z122 Encounter for screening for malignant neoplasm of respiratory organs: Secondary | ICD-10-CM

## 2024-03-08 NOTE — Telephone Encounter (Signed)
 Copied from CRM [id number]. Topic: Clinical - Lab/Test Results >> Mar 07, 2024  3:04 PM Rilla B wrote: Reason for CRM: Patient would like a call to go over LS results. Please call patient at [phone].  Routing to lung nodule pool for follow up.  Could someone call and speak with this patient regarding her CT results?  Thank you.

## 2024-03-08 NOTE — Telephone Encounter (Signed)
 Patient made aware CT not read as of today. Once resulted she will get a call. Copied from CRM #[id number]. Topic: Clinical - Lab/Test Results >> Mar 06, 2024  4:01 PM Nathanel DEL wrote: Reason for CRM: pt calling to ask if her CT results have come back yet

## 2024-03-08 NOTE — Telephone Encounter (Signed)
 Called and left VM for pt

## 2024-03-08 NOTE — Telephone Encounter (Signed)
 Patient returned call. Informed her of the results and recommendations of recent LDCT. We discussed all findings listed in the impression of the scan. All questions were answered at the time of the call. Patient aware the results were sent to her PCP as normal protocol and annual scan has already been ordered. Nothing further needed at the time of the call.

## 2024-03-20 ENCOUNTER — Ambulatory Visit: Admitting: Orthopaedic Surgery

## 2024-03-20 DIAGNOSIS — M1712 Unilateral primary osteoarthritis, left knee: Secondary | ICD-10-CM | POA: Diagnosis not present

## 2024-03-20 MED ORDER — METHYLPREDNISOLONE ACETATE 40 MG/ML IJ SUSP
40.0000 mg | INTRAMUSCULAR | Status: AC | PRN
  Administered 2024-03-20: 40 mg via INTRA_ARTICULAR

## 2024-03-20 MED ORDER — LIDOCAINE HCL 1 % IJ SOLN
2.0000 mL | INTRAMUSCULAR | Status: AC | PRN
  Administered 2024-03-20: 2 mL

## 2024-03-20 MED ORDER — BUPIVACAINE HCL 0.5 % IJ SOLN
2.0000 mL | INTRAMUSCULAR | Status: AC | PRN
  Administered 2024-03-20: 2 mL via INTRA_ARTICULAR

## 2024-03-20 NOTE — Progress Notes (Signed)
 Office Visit Note   Patient: Jacqueline Berry           Date of Birth: Aug 21, 1960           MRN: [id number] Visit Date: 03/20/2024              Requested by: Rollene Almarie LABOR, MD [address] [place],  [place] [address] PCP: Rollene Almarie LABOR, MD   Assessment & Plan: Visit Diagnoses:  1. Primary osteoarthritis of left knee     Plan: History of Present Illness Jacqueline Berry is a 63 year old female who presents for follow up evaluation of left knee DJD.    She has recurrent left knee pain as the benefit from her prior injection has worn off.  Left knee exam is unchanged from prior visit.  Assessment and Plan Primary osteoarthritis of left knee Chronic osteoarthritis with previous injection effects diminishing. - Administered left knee injection.  Follow-Up Instructions: No follow-ups on file.   Orders:  No orders of the defined types were placed in this encounter.  No orders of the defined types were placed in this encounter.     Procedures: Large Joint Inj: L knee on 03/20/2024 2:46 PM Details: 22 G needle Medications: 2 mL bupivacaine  0.5 %; 2 mL lidocaine  1 %; 40 mg methylPREDNISolone  acetate 40 MG/ML Outcome: tolerated well, no immediate complications Patient was prepped and draped in the usual sterile fashion.       Clinical Data: No additional findings.   Subjective: Chief Complaint  Patient presents with   Left Knee - Pain    HPI  Review of Systems  Constitutional: Negative.   HENT: Negative.    Eyes: Negative.   Respiratory: Negative.    Cardiovascular: Negative.   Endocrine: Negative.   Musculoskeletal: Negative.   Neurological: Negative.   Hematological: Negative.   Psychiatric/Behavioral: Negative.    All other systems reviewed and are negative.    Objective: Vital Signs: LMP 02/18/2011   Physical Exam Vitals and nursing note reviewed.  Constitutional:      Appearance: She is well-developed.  HENT:     Head:  Atraumatic.     Nose: Nose normal.  Eyes:     Extraocular Movements: Extraocular movements intact.  Cardiovascular:     Pulses: Normal pulses.  Pulmonary:     Effort: Pulmonary effort is normal.  Abdominal:     Palpations: Abdomen is soft.  Musculoskeletal:     Cervical back: Neck supple.  Skin:    General: Skin is warm.     Capillary Refill: Capillary refill takes less than 2 seconds.  Neurological:     Mental Status: She is alert. Mental status is at baseline.  Psychiatric:        Behavior: Behavior normal.        Thought Content: Thought content normal.        Judgment: Judgment normal.     Ortho Exam  Specialty Comments:  No specialty comments available.  Imaging: No results found.   PMFS History: Patient Active Problem List   Diagnosis Date Noted   Blurry vision 11/30/2022   Atypical chest pain 08/20/2022   Prediabetes 02/25/2022   Acute medial meniscus tear of left knee 08/18/2021   Primary osteoarthritis of left knee 08/18/2021   Breast pain, right 05/16/2020   Hyperlipidemia LDL goal <130 12/31/2019   Urinary incontinence 09/30/2017   Routine general medical examination at a health care facility 09/30/2017   Morbid obesity (HCC) 10/16/2009  Hypothyroidism 07/11/2009   Paranoid schizophrenia in remission (HCC) 06/24/2009   Essential hypertension 06/24/2009   HX, PERSONAL, MALIGNANCY, BREAST 12/07/2006   Past Medical History:  Diagnosis Date   Bipolar disorder (HCC)    Breast cancer (HCC)    with chemo - 1997   Hypertension    Personal history of chemotherapy    Syphilis    Thyroid  disease    Thyroid  disease     Family History  Problem Relation Age of Onset   Cancer Mother        ? type   Stroke Father    Breast cancer Sister        52's   Diabetes Brother    Coronary artery disease Brother    Colon cancer Neg Hx    Esophageal cancer Neg Hx    Rectal cancer Neg Hx    Stomach cancer Neg Hx     Past Surgical History:  Procedure  Laterality Date   BREAST BIOPSY  1997   MASTECTOMY     left breat mastectomy 1997   TUBAL LIGATION     1992   Social History   Occupational History   Not on file  Tobacco Use   Smoking status: Former    Current packs/day: 0.50    Average packs/day: 1 pack/day for 50.9 years (49.0 ttl pk-yrs)    Types: Cigarettes    Start date: 04/19/1973    Passive exposure: Past   Smokeless tobacco: Never  Vaping Use   Vaping status: Former  Substance and Sexual Activity   Alcohol use: No    Comment: Pt denies   Drug use: No    Comment: Pt denies    Sexual activity: Yes    Birth control/protection: Surgical    Comment: 1st intercourse 63 yo-More than 5 partners

## 2024-04-02 ENCOUNTER — Ambulatory Visit: Payer: Self-pay

## 2024-04-02 NOTE — Telephone Encounter (Signed)
 FYI Only or Action Required?: FYI only for provider: Denied OV.  Patient was last seen in primary care on 10/17/2023 by Rollene Almarie LABOR, MD.  Called Nurse Triage reporting Fatigue.  Symptoms began several months ago.  Interventions attempted: Nothing.  Symptoms are: stable.  Triage Disposition: See PCP When Office is Open (Within 3 Days)  Patient/caregiver understands and will follow disposition?: No Reason for Disposition  [1] Fatigue (i.e., tires easily, decreased energy) AND [2] persists > 1 week  Answer Assessment - Initial Assessment Questions Patient denied scheduling, states she just thought something could be prescribed. Educated patient on importance of coming in for a visit to be evaluated, patient still denied and stated I'll just drink an energy drink   1. DESCRIPTION: Describe how you are feeling.     Doing a lot of sleeping  2. SEVERITY: How bad is it?  Can you stand and walk?     Getting worse  3. ONSET: When did these symptoms begin? (e.g., hours, days, weeks, months)     Been going on for a while but saying 2 months  4. CAUSE: What do you think is causing the weakness or fatigue? (e.g., not drinking enough fluids, medical problem, trouble sleeping)     Unsure  5. NEW MEDICINES:  Have you started on any new medicines recently? (e.g., opioid pain medicines, benzodiazepines, muscle relaxants, antidepressants, antihistamines, neuroleptics, beta blockers)     Denies  6. OTHER SYMPTOMS: Do you have any other symptoms? (e.g., chest pain, fever, cough, SOB, vomiting, diarrhea, bleeding, other areas of pain)     Denies  Protocols used: Weakness (Generalized) and Fatigue-A-AH  Copied from CRM #[id number]. Topic: Clinical - Red Word Triage >> Apr 02, 2024  3:31 PM Willma R wrote: [facility] that prompted transfer to Nurse Triage: Patient states she is experiencing a lot of low energy and fatigue for the last few months. Progressively getting worse,  she is sleeping a lot

## 2024-05-05 ENCOUNTER — Other Ambulatory Visit: Payer: Self-pay

## 2024-05-05 ENCOUNTER — Encounter (HOSPITAL_COMMUNITY): Payer: Self-pay | Admitting: Emergency Medicine

## 2024-05-05 ENCOUNTER — Emergency Department (HOSPITAL_COMMUNITY)
Admission: EM | Admit: 2024-05-05 | Discharge: 2024-05-05 | Disposition: A | Discharge status: Home / Self Care | Attending: Emergency Medicine | Admitting: Emergency Medicine

## 2024-05-05 DIAGNOSIS — Z91040 Latex allergy status: Secondary | ICD-10-CM | POA: Diagnosis not present

## 2024-05-05 DIAGNOSIS — T162XXA Foreign body in left ear, initial encounter: Secondary | ICD-10-CM | POA: Diagnosis present

## 2024-05-05 DIAGNOSIS — X58XXXA Exposure to other specified factors, initial encounter: Secondary | ICD-10-CM | POA: Diagnosis not present

## 2024-05-05 MED ORDER — LIDOCAINE HCL (PF) 1 % IJ SOLN
2.0000 mL | Freq: Once | INTRAMUSCULAR | Status: DC
  Filled 2024-05-05: qty 30

## 2024-05-05 NOTE — ED Triage Notes (Signed)
" °  Patient comes in with cotton top of q tip stuck in L ear.  Patient states she was cleaning her ear about and hour ago when it occurred.  Denies any pain or issues with hearing.   "

## 2024-05-05 NOTE — Discharge Instructions (Signed)
 The end of a cotton swab was removed from your left ear this evening.  Please avoid using Q-tips in your ear canal.  These can break and become lodged or cause damage to your eardrum.

## 2024-05-05 NOTE — ED Provider Notes (Signed)
 " [place] EMERGENCY DEPARTMENT AT [facility] : [facility] Provider Note   CSN: [id number] Arrival date & time: 05/05/24  0017     Patient presents with: Foreign Body in Ear   Jacqueline Berry is a 64 y.o. female.  Patient presents to the Emergency Department complaining of a Q-tip tip which is stuck in her left ear.  She says she was cleaning her ear and the Q-tip broke.  She states she then tried to use other ways to get it out and feels that she may have pushed it deeper into her ear canal.  She denies pain but endorses muffled hearing out of the left ear.    Foreign Body in Ear       Prior to Admission medications  Medication Sig Start Date End Date Taking? Authorizing Provider  amLODipine  (NORVASC ) 10 MG tablet Take 1 tablet (10 mg total) by mouth daily. 10/17/23   Rollene Almarie LABOR, MD  benztropine (COGENTIN) 0.5 MG tablet Take 0.5 mg by mouth daily.     [provider]  haloperidol  (HALDOL ) 5 MG tablet Take 2.5 mg by mouth daily.     [provider]  haloperidol  decanoate (HALDOL  DECANOATE) 50 MG/ML injection Inject 100 mg into the muscle every 28 (twenty-eight) days.    [provider]  levothyroxine  (SYNTHROID ) 100 MCG tablet Take 1 tablet (100 mcg total) by mouth daily. 10/17/23   Rollene Almarie LABOR, MD  lisinopril -hydrochlorothiazide  (ZESTORETIC ) 20-12.5 MG tablet Take 1 tablet by mouth daily. 10/17/23   Rollene Almarie LABOR, MD  traZODone  (DESYREL ) 50 MG tablet Take 50 mg by mouth at bedtime as needed. 10/05/23   [provider]    Allergies: Latex    Review of Systems  Updated Vital Signs BP (!) 147/88 (BP Location: Right Arm)   Pulse 86   Temp 98.1 F (36.7 C) (Oral)   Resp 20   LMP 02/18/2011   SpO2 99%   Physical Exam Vitals and nursing note reviewed.  HENT:     Head: Normocephalic and atraumatic.     Ears:     Comments: Continent of Q-tip noted against tympanic membrane of left ear Eyes:     Pupils: Pupils are  equal, round, and reactive to light.  Pulmonary:     Effort: Pulmonary effort is normal. No respiratory distress.  Musculoskeletal:        General: No signs of injury.     Cervical back: Normal range of motion.  Skin:    General: Skin is dry.  Neurological:     Mental Status: She is alert.  Psychiatric:        Speech: Speech normal.        Behavior: Behavior normal.     (all labs ordered are listed, but only abnormal results are displayed) Labs Reviewed - No data to display  EKG: None  Radiology: No results found.   .Foreign Body Removal  Date/Time: 05/05/2024 5:55 AM  Performed by: Logan Ubaldo NOVAK, PA-C Authorized by: Logan Ubaldo NOVAK, PA-C  Consent: Verbal consent obtained Risks and benefits: risks, benefits and alternatives were discussed Consent given by: patient Body area: ear Location details: left ear Anesthesia: local infiltration  Anesthesia: Local Anesthetic: lidocaine  1% without epinephrine Anesthetic total: 2 mL Patient cooperative: yes Localization method: visualized and probed Removal mechanism: forceps, irrigation and suction 1 objects recovered. Objects recovered: Cotton swab tip Post-procedure assessment: foreign body removed Patient tolerance: patient tolerated the procedure well with no immediate complications  Medications Ordered in the ED  lidocaine  (PF) (XYLOCAINE ) 1 % injection 2 mL (has no administration in time range)                                    Medical Decision Making Risk Prescription drug management.   Patient presented to the emergency room with a chief complaint of foreign body in ear.  Visual examination showed a tip of a cotton swab in the left ear canal.  Foreign body was removed as noted in procedure note.  Patient tolerated the procedure well.  No indication for further emergent workup or admission.     Final diagnoses:  Foreign body of left ear, initial encounter    ED Discharge Orders     None           Logan Ubaldo KATHEE DEVONNA 05/05/24 [id number]    Theadore Ozell HERO, MD 05/05/24 [phone]  "

## 2024-08-02 ENCOUNTER — Ambulatory Visit

## 2024-09-07 ENCOUNTER — Ambulatory Visit
